# Patient Record
Sex: Male | Born: 1980 | Race: White | Hispanic: No | Marital: Married | Smoking: Never smoker
Health system: Southern US, Community
[De-identification: ages and names within clinical notes are randomized; demographics above are authoritative.]

## PROBLEM LIST (undated history)

## (undated) DIAGNOSIS — F32A Depression, unspecified: Secondary | ICD-10-CM

## (undated) DIAGNOSIS — J301 Allergic rhinitis due to pollen: Secondary | ICD-10-CM

## (undated) DIAGNOSIS — J019 Acute sinusitis, unspecified: Secondary | ICD-10-CM

## (undated) DIAGNOSIS — F909 Attention-deficit hyperactivity disorder, unspecified type: Secondary | ICD-10-CM

## (undated) DIAGNOSIS — J029 Acute pharyngitis, unspecified: Secondary | ICD-10-CM

## (undated) DIAGNOSIS — G43909 Migraine, unspecified, not intractable, without status migrainosus: Secondary | ICD-10-CM

## (undated) HISTORY — DX: Acute sinusitis, unspecified: J01.90

## (undated) HISTORY — DX: Acute pharyngitis, unspecified: J02.9

## (undated) HISTORY — DX: Allergic rhinitis due to pollen: J30.1

## (undated) HISTORY — DX: Migraine, unspecified, not intractable, without status migrainosus: G43.909

## (undated) HISTORY — PX: HAIR TRANSPLANT: SHX1719

## (undated) HISTORY — PX: TONSILLECTOMY: SUR1361

## (undated) HISTORY — PX: RHINOPLASTY: SUR1284

---

## 2009-06-16 ENCOUNTER — Ambulatory Visit (HOSPITAL_COMMUNITY): Admission: RE | Admit: 2009-06-16 | Discharge: 2009-06-16 | Payer: Self-pay | Admitting: Family Medicine

## 2012-06-25 ENCOUNTER — Emergency Department: Payer: Self-pay | Admitting: Emergency Medicine

## 2012-06-25 LAB — LIPASE, BLOOD: Lipase: 99 U/L (ref 73–393)

## 2012-06-25 LAB — COMPREHENSIVE METABOLIC PANEL
Albumin: 4.2 g/dL (ref 3.4–5.0)
Alkaline Phosphatase: 53 U/L (ref 50–136)
Bilirubin,Total: 0.4 mg/dL (ref 0.2–1.0)
Co2: 24 mmol/L (ref 21–32)
Creatinine: 1.1 mg/dL (ref 0.60–1.30)
Glucose: 138 mg/dL — ABNORMAL HIGH (ref 65–99)
Total Protein: 8.1 g/dL (ref 6.4–8.2)

## 2012-06-25 LAB — CBC
MCV: 88 fL (ref 80–100)
RBC: 5.19 10*6/uL (ref 4.40–5.90)

## 2012-09-12 ENCOUNTER — Ambulatory Visit: Payer: Self-pay | Admitting: Family Medicine

## 2013-07-26 DIAGNOSIS — J302 Other seasonal allergic rhinitis: Secondary | ICD-10-CM | POA: Insufficient documentation

## 2014-02-06 ENCOUNTER — Ambulatory Visit: Payer: Self-pay | Admitting: Family Medicine

## 2014-05-12 LAB — LIPID PANEL
Cholesterol: 204 mg/dL — AB (ref 0–200)
HDL: 44 mg/dL (ref 35–70)
LDL CALC: 134 mg/dL
Triglycerides: 129 mg/dL (ref 40–160)

## 2014-11-10 ENCOUNTER — Ambulatory Visit: Payer: Self-pay | Admitting: Family Medicine

## 2015-06-08 ENCOUNTER — Other Ambulatory Visit
Admission: RE | Admit: 2015-06-08 | Discharge: 2015-06-08 | Disposition: A | Discharge status: Home / Self Care | Payer: BC Managed Care – PPO | Source: Ambulatory Visit | Attending: Family Medicine | Admitting: Family Medicine

## 2015-06-08 ENCOUNTER — Encounter: Payer: Self-pay | Admitting: Family Medicine

## 2015-06-08 ENCOUNTER — Ambulatory Visit (INDEPENDENT_AMBULATORY_CARE_PROVIDER_SITE_OTHER): Payer: BC Managed Care – PPO | Admitting: Family Medicine

## 2015-06-08 VITALS — BP 108/66 | HR 73 | Temp 98.3°F | Resp 16 | Ht 71.0 in | Wt 236.3 lb

## 2015-06-08 DIAGNOSIS — R197 Diarrhea, unspecified: Secondary | ICD-10-CM | POA: Diagnosis not present

## 2015-06-08 DIAGNOSIS — Z23 Encounter for immunization: Secondary | ICD-10-CM | POA: Diagnosis not present

## 2015-06-08 DIAGNOSIS — E669 Obesity, unspecified: Secondary | ICD-10-CM | POA: Insufficient documentation

## 2015-06-08 DIAGNOSIS — R110 Nausea: Secondary | ICD-10-CM | POA: Diagnosis not present

## 2015-06-08 DIAGNOSIS — G43909 Migraine, unspecified, not intractable, without status migrainosus: Secondary | ICD-10-CM | POA: Insufficient documentation

## 2015-06-08 DIAGNOSIS — R1030 Lower abdominal pain, unspecified: Secondary | ICD-10-CM | POA: Diagnosis not present

## 2015-06-08 LAB — COMPREHENSIVE METABOLIC PANEL
ALBUMIN: 4.3 g/dL (ref 3.5–5.0)
ALK PHOS: 49 U/L (ref 38–126)
ALT: 64 U/L — AB (ref 17–63)
ANION GAP: 5 (ref 5–15)
AST: 32 U/L (ref 15–41)
BUN: 8 mg/dL (ref 6–20)
CALCIUM: 8.8 mg/dL — AB (ref 8.9–10.3)
CO2: 28 mmol/L (ref 22–32)
CREATININE: 0.89 mg/dL (ref 0.61–1.24)
Chloride: 105 mmol/L (ref 101–111)
GFR calc Af Amer: >60 mL/min (ref >60)
GFR calc non Af Amer: >60 mL/min (ref >60)
GLUCOSE: 93 mg/dL (ref 65–99)
Potassium: 3.7 mmol/L (ref 3.5–5.1)
SODIUM: 138 mmol/L (ref 135–145)
Total Bilirubin: 0.4 mg/dL (ref 0.3–1.2)
Total Protein: 8 g/dL (ref 6.5–8.1)

## 2015-06-08 LAB — CBC WITH DIFFERENTIAL/PLATELET
BASOS ABS: 0 10*3/uL (ref 0–0.1)
BASOS PCT: 1 %
EOS ABS: 0.2 10*3/uL (ref 0–0.7)
Eosinophils Relative: 2 %
HCT: 42.5 % (ref 40.0–52.0)
HEMOGLOBIN: 14.7 g/dL (ref 13.0–18.0)
Lymphocytes Relative: 28 %
Lymphs Abs: 2.1 10*3/uL (ref 1.0–3.6)
MCH: 29.9 pg (ref 26.0–34.0)
MCHC: 34.5 g/dL (ref 32.0–36.0)
MCV: 86.5 fL (ref 80.0–100.0)
Monocytes Absolute: 0.9 10*3/uL (ref 0.2–1.0)
Monocytes Relative: 12 %
NEUTROS PCT: 57 %
Neutro Abs: 4.4 10*3/uL (ref 1.4–6.5)
Platelets: 328 10*3/uL (ref 150–440)
RBC: 4.91 MIL/uL (ref 4.40–5.90)
RDW: 12.4 % (ref 11.5–14.5)
WBC: 7.6 10*3/uL (ref 3.8–10.6)

## 2015-06-08 LAB — POC HEMOCCULT BLD/STL (OFFICE/1-CARD/DIAGNOSTIC): Fecal Occult Blood, POC: NEGATIVE

## 2015-06-08 NOTE — Progress Notes (Signed)
Name: Gregory Matthews   MRN: [id number]    DOB: 1980/10/17   Date:06/08/2015       Progress Note  Subjective  Chief Complaint  Chief Complaint  Patient presents with  . Abdominal Pain    Onset- Friday at work, nausea, liquid diarrhea, lower abdominal pain and radiates to right lumbar region. Dry heaving and pain has been sharp and constant, worst with movement.  . Diarrhea    Having dark liquidly stools    HPI  Abdominal pain : he states he left work on Friday with low abdominal pain, described as dull and sometimes sharp. Nausea, fatigue, lack of appetite, followed by diarrhea - many episodes from Saturday until this am. Last episode about 7 hours ago. Stools were dark, watery, but negative for bright blood. He denies fever. Able to keep fluids down, eating very little because he is not hungry.  He is not sure if he lost weight. He is feeling very bloated. Wife has pneumonia.  Patient Active Problem List   Diagnosis Date Noted  . Headache, migraine 06/08/2015  . Obesity (BMI 30.0-34.9) 06/08/2015  . Allergic rhinitis, seasonal 07/26/2013    Past Surgical History  Procedure Laterality Date  . Rhinoplasty    . Tonsillectomy      Family History  Problem Relation Age of Onset  . Cancer Mother     Breast  . Hypertension Father   . Cancer Maternal Aunt     Breast  . Hyperlipidemia Maternal Grandmother   . Parkinson's disease Paternal Grandfather     Social History   Social History  . Marital Status: Married    Spouse Name: N/A  . Number of Children: N/A  . Years of Education: N/A   Occupational History  . Not on file.   Social History Main Topics  . Smoking status: Never Smoker   . Smokeless tobacco: Never Used  . Alcohol Use: No  . Drug Use: No  . Sexual Activity:    Partners: Female   Other Topics Concern  . Not on file   Social History Narrative    No current outpatient prescriptions on file.  Allergies  Allergen Reactions  . Penicillins Rash      ROS  Ten systems reviewed and is negative except as mentioned in HPI   Objective  Filed Vitals:   06/08/15 1449  BP: 108/66  Pulse: 73  Temp: 98.3 F (36.8 C)  TempSrc: Oral  Resp: 16  Height:  (1.803 m)  Weight: 236 lb 4.8 oz (107.185 kg)  SpO2: 97%    Body mass index is 32.97 kg/(m^2).  Physical Exam  Constitutional: Patient appears well-developed and well-nourished. Obese  No distress, but has sunken eyes with dark circles underneath HEENT: head atraumatic, normocephalic, pupils equal and reactive to light, , neck supple, throat within normal limits Cardiovascular: Normal rate, regular rhythm and normal heart sounds.  No murmur heard. No BLE edema. Pulmonary/Chest: Effort normal and breath sounds normal. No respiratory distress. Abdominal: Soft. Normal bowel sounds, tender during palpation throughout but worse during palpation of RLQ, negative hemoccult stools. No rectal masses. Stools brown Psychiatric: Patient has a normal mood and affect. behavior is normal. Judgment and thought content normal.    PHQ2/9: Depression screen PHQ 2/9 06/08/2015  Decreased Interest 0  Down, Depressed, Hopeless 0  PHQ - 2 Score 0     Fall Risk: Fall Risk  06/08/2015  Falls in the past year? No     Functional Status  Survey: Is the patient deaf or have difficulty hearing?: No Does the patient have difficulty seeing, even when wearing glasses/contacts?: No Does the patient have difficulty concentrating, remembering, or making decisions?: No Does the patient have difficulty walking or climbing stairs?: No Does the patient have difficulty dressing or bathing?: No Does the patient have difficulty doing errands alone such as visiting a doctor's office or shopping?: No    Assessment & Plan   1. Lower abdominal pain  Worse tenderness on RLQ, negative hemoccult stools, he is afebrile but still very bloated with lack of appetite, we will check stat labs, if WBC if high  we will order a CT scan abdomen stat to rule out appendicitis - CBC with Differential/Platelet - Comprehensive metabolic panel - Flu Vaccine QUAD 36+ mos IM - POC Hemoccult Bld/Stl (1-Cd Office Dx)  2. Nausea  - CBC with Differential/Platelet - Comprehensive metabolic panel - Flu Vaccine QUAD 36+ mos IM  3. Diarrhea, unspecified type  - CBC with Differential/Platelet - Comprehensive metabolic panel - Flu Vaccine QUAD 36+ mos IM - POC Hemoccult Bld/Stl (1-Cd Office Dx)  4. Needs flu shot  - Flu Vaccine QUAD 36+ mos IM

## 2015-06-10 ENCOUNTER — Telehealth: Payer: Self-pay | Admitting: Family Medicine

## 2015-06-10 NOTE — Telephone Encounter (Signed)
Please contact patient . Ask if he is feeling better.

## 2015-06-10 NOTE — Telephone Encounter (Signed)
I tried to contact this patient to see how he was doing but there was no answer and the mailbox was full so I was not able to leave a message.

## 2015-08-31 ENCOUNTER — Ambulatory Visit (INDEPENDENT_AMBULATORY_CARE_PROVIDER_SITE_OTHER): Payer: BC Managed Care – PPO | Admitting: Family Medicine

## 2015-08-31 ENCOUNTER — Encounter: Payer: Self-pay | Admitting: Family Medicine

## 2015-08-31 VITALS — BP 104/66 | HR 61 | Temp 98.0°F | Resp 12 | Ht 70.5 in | Wt 241.4 lb

## 2015-08-31 DIAGNOSIS — G43011 Migraine without aura, intractable, with status migrainosus: Secondary | ICD-10-CM | POA: Diagnosis not present

## 2015-08-31 MED ORDER — TOPIRAMATE 50 MG PO TABS: 25.0000 mg | ORAL_TABLET | Freq: Two times a day (BID) | ORAL | Status: DC

## 2015-08-31 MED ORDER — KETOROLAC TROMETHAMINE 60 MG/2ML IM SOLN
60.0000 mg | Freq: Once | INTRAMUSCULAR | Status: AC
  Administered 2015-08-31: 60 mg via INTRAMUSCULAR

## 2015-08-31 MED ORDER — DEXAMETHASONE SODIUM PHOSPHATE 10 MG/ML IJ SOLN
10.0000 mg | Freq: Once | INTRAMUSCULAR | Status: AC
  Administered 2015-08-31: 10 mg via INTRAMUSCULAR

## 2015-08-31 MED ORDER — PROMETHAZINE HCL 12.5 MG PO TABS: 12.5000 mg | ORAL_TABLET | Freq: Three times a day (TID) | ORAL | Status: DC | PRN

## 2015-08-31 MED ORDER — SUMATRIPTAN SUCCINATE 100 MG PO TABS: 100.0000 mg | ORAL_TABLET | ORAL | Status: DC | PRN

## 2015-08-31 MED ORDER — PROMETHAZINE HCL 25 MG/ML IJ SOLN: 25.0000 mg | Freq: Once | INTRAMUSCULAR | Status: DC

## 2015-08-31 NOTE — Progress Notes (Signed)
Name: Gregory Matthews   MRN: [id number]    DOB: [birth year]   Date:08/31/2015       Progress Note  Subjective  Chief Complaint  Chief Complaint  Patient presents with  . Migraine    started yesterday afternoon and has intensified. nausea, pain and sensitivity to light. otc excedrin migraine.    HPI  Migraine: he states he has a long history of migraine headaches, but used to respond to Excedrin migraines. The episodes have been getting more frequent and intense. This episode started yesterday, initially with a dull right temporal headache, followed by nausea and throbbing, pounding sensation on right temporal area. He has photophobia, phonophobia and movement - such as walking. He denies any neuro deficit.    Patient Active Problem List   Diagnosis Date Noted  . Headache, migraine 06/08/2015  . Obesity (BMI 30.0-34.9) 06/08/2015  . Allergic rhinitis, seasonal 07/26/2013    Past Surgical History  Procedure Laterality Date  . Rhinoplasty    . Tonsillectomy      Family History  Problem Relation Age of Onset  . Cancer Mother     Breast  . Hypertension Father   . Cancer Maternal Aunt     Breast  . Hyperlipidemia Maternal Grandmother   . Parkinson's disease Paternal Grandfather     Social History   Social History  . Marital Status: Married    Spouse Name: N/A  . Number of Children: N/A  . Years of Education: N/A   Occupational History  . Not on file.   Social History Main Topics  . Smoking status: Never Smoker   . Smokeless tobacco: Never Used  . Alcohol Use: No  . Drug Use: No  . Sexual Activity:    Partners: Female   Other Topics Concern  . Not on file   Social History Narrative     Current outpatient prescriptions:  .  promethazine (PHENERGAN) 12.5 MG tablet, Take 1 tablet (12.5 mg total) by mouth every 8 (eight) hours as needed for nausea or vomiting., Disp: 20 tablet, Rfl: 0 .  SUMAtriptan (IMITREX) 100 MG tablet, Take 1 tablet (100 mg total) by  mouth every 2 (two) hours as needed for migraine. May repeat in 2 hours if headache persists or recurs., Disp: 10 tablet, Rfl: 0 .  topiramate (TOPAMAX) 50 MG tablet, Take 0.5-2 tablets (25-100 mg total) by mouth 2 (two) times daily., Disp: 60 tablet, Rfl: 0  Current facility-administered medications:  .  dexamethasone (DECADRON) injection 10 mg, 10 mg, Intramuscular, Once, Steele Sizer, MD .  ketorolac (TORADOL) injection 60 mg, 60 mg, Intramuscular, Once, Steele Sizer, MD .  promethazine (PHENERGAN) injection 25 mg, 25 mg, Intramuscular, Once, Steele Sizer, MD  Allergies  Allergen Reactions  . Penicillins Rash     ROS  Ten systems reviewed and is negative except as mentioned in HPI   Objective  Filed Vitals:   08/31/15 1139  BP: 104/66  Pulse: 61  Temp: 98 F (36.7 C)  TempSrc: Oral  Resp: 12  Height: 5' 10.5" (1.791 m)  Weight: 241 lb 6.4 oz (109.498 kg)  SpO2: 98%    Body mass index is 34.14 kg/(m^2).  Physical Exam  Constitutional: Patient appears well-developed and well-nourished. Obese  No distress.  HEENT: head atraumatic, normocephalic, pupils equal and reactive to light,neck supple, throat within normal limits Cardiovascular: Normal rate, regular rhythm and normal heart sounds.  No murmur heard. No BLE edema. Pulmonary/Chest: Effort normal and breath sounds normal. No respiratory  distress. Abdominal: Soft.  There is no tenderness. Psychiatric: Patient has a normal mood and affect. behavior is normal. Judgment and thought content normal. Neurological: cranial nerves negative, normal strength and sensation  Recent Results (from the past 2160 hour(s))  POC Hemoccult Bld/Stl (1-Cd Office Dx)     Status: Normal   Collection Time: 06/08/15  3:11 PM  Result Value Ref Range   Card #1 Date 06/08/2015    Fecal Occult Blood, POC Negative Negative  CBC with Differential/Platelet     Status: None   Collection Time: 06/08/15  3:45 PM  Result Value Ref Range    WBC 7.6 3.8 - 10.6 K/uL   RBC 4.91 4.40 - 5.90 MIL/uL   Hemoglobin 14.7 13.0 - 18.0 g/dL   HCT 42.5 40.0 - 52.0 %   MCV 86.5 80.0 - 100.0 fL   MCH 29.9 26.0 - 34.0 pg   MCHC 34.5 32.0 - 36.0 g/dL   RDW 12.4 11.5 - 14.5 %   Platelets 328 150 - 440 K/uL   Neutrophils Relative % 57 %   Neutro Abs 4.4 1.4 - 6.5 K/uL   Lymphocytes Relative 28 %   Lymphs Abs 2.1 1.0 - 3.6 K/uL   Monocytes Relative 12 %   Monocytes Absolute 0.9 0.2 - 1.0 K/uL   Eosinophils Relative 2 %   Eosinophils Absolute 0.2 0 - 0.7 K/uL   Basophils Relative 1 %   Basophils Absolute 0.0 0 - 0.1 K/uL  Comprehensive metabolic panel     Status: Abnormal   Collection Time: 06/08/15  3:45 PM  Result Value Ref Range   Sodium 138 135 - 145 mmol/L   Potassium 3.7 3.5 - 5.1 mmol/L   Chloride 105 101 - 111 mmol/L   CO2 28 22 - 32 mmol/L   Glucose, Bld 93 65 - 99 mg/dL   BUN 8 6 - 20 mg/dL   Creatinine, Ser 0.89 0.61 - 1.24 mg/dL   Calcium 8.8 (L) 8.9 - 10.3 mg/dL   Total Protein 8.0 6.5 - 8.1 g/dL   Albumin 4.3 3.5 - 5.0 g/dL   AST 32 15 - 41 U/L   ALT 64 (H) 17 - 63 U/L   Alkaline Phosphatase 49 38 - 126 U/L   Total Bilirubin 0.4 0.3 - 1.2 mg/dL   GFR calc non Af Amer >60 >60 mL/min   GFR calc Af Amer >60 >60 mL/min    Comment: (NOTE) The eGFR has been calculated using the CKD EPI equation. This calculation has not been validated in all clinical situations. eGFR's persistently <60 mL/min signify possible Chronic Kidney Disease.    Anion gap 5 5 - 15     PHQ2/9: Depression screen [facility] 2/9 08/31/2015 06/08/2015  Decreased Interest 0 0  Down, Depressed, Hopeless 0 0  PHQ - 2 Score 0 0    Fall Risk: Fall Risk  08/31/2015 06/08/2015  Falls in the past year? No No    Functional Status Survey: Is the patient deaf or have difficulty hearing?: No Does the patient have difficulty seeing, even when wearing glasses/contacts?: No Does the patient have difficulty concentrating, remembering, or making decisions?:  No Does the patient have difficulty walking or climbing stairs?: No Does the patient have difficulty dressing or bathing?: No Does the patient have difficulty doing errands alone such as visiting a doctor's office or shopping?: No    Assessment & Plan  1. Intractable migraine without aura and with status migrainosus  Advised to avoid taking nsaid's  on regular basis because of risk of rebound headaches, possible side effects of Topamax and the need to titrate up and down slowly - promethazine (PHENERGAN) 12.5 MG tablet; Take 1 tablet (12.5 mg total) by mouth every 8 (eight) hours as needed for nausea or vomiting.  Dispense: 20 tablet; Refill: 0 - topiramate (TOPAMAX) 50 MG tablet; Take 0.5-2 tablets (25-100 mg total) by mouth 2 (two) times daily.  Dispense: 60 tablet; Refill: 0 - SUMAtriptan (IMITREX) 100 MG tablet; Take 1 tablet (100 mg total) by mouth every 2 (two) hours as needed for migraine. May repeat in 2 hours if headache persists or recurs.  Dispense: 10 tablet; Refill: 0 - ketorolac (TORADOL) injection 60 mg; Inject 2 mLs (60 mg total) into the muscle once. - dexamethasone (DECADRON) injection 10 mg; Inject 1 mL (10 mg total) into the muscle once.  We were out of stock of promethazine

## 2015-10-09 ENCOUNTER — Encounter: Payer: Self-pay | Admitting: Family Medicine

## 2015-10-09 ENCOUNTER — Ambulatory Visit (INDEPENDENT_AMBULATORY_CARE_PROVIDER_SITE_OTHER): Payer: BC Managed Care – PPO | Admitting: Family Medicine

## 2015-10-09 VITALS — BP 110/64 | HR 79 | Temp 98.0°F | Resp 16 | Ht 71.0 in | Wt 242.7 lb

## 2015-10-09 DIAGNOSIS — G43009 Migraine without aura, not intractable, without status migrainosus: Secondary | ICD-10-CM | POA: Diagnosis not present

## 2015-10-09 MED ORDER — PREDNISONE 10 MG PO TABS: 10.0000 mg | ORAL_TABLET | Freq: Two times a day (BID) | ORAL | Status: DC | PRN

## 2015-10-09 MED ORDER — AMITRIPTYLINE HCL 25 MG PO TABS: 25.0000 mg | ORAL_TABLET | Freq: Every day | ORAL | Status: DC

## 2015-10-09 NOTE — Progress Notes (Signed)
Name: Gregory Matthews   MRN: [id number]    DOB: [birth year]   Date:10/09/2015       Progress Note  Subjective  Chief Complaint  Chief Complaint  Patient presents with  . Follow-up    patient is here for his 68-month f/u.  . Migraine    Topamax was giving him terrible side effects so he stopped the medication after a week and a half, feels like his cheeks were hot, pressure in his chest.  Patient states the Imitrex also made him heated and would intensive his migraines instead of helping them. Patient states he did take the Promethazine for nausea and it did help, but only has had to take it 1 or twice and had no side effects with this medication. Patient states when moving alot of furniture at work, they come on. Has taken OTC to help    HPI  Migraine: he states he has a long history of migraine headaches, and takes  Excedrin migraine prn. He had a severe episode one month ago that required him to be seen in our office. We treated him in our office and started Topamax and Imitrex. He has stopped both medication secondary to side effects. Topamax he stopped because of change in taste and Imitrex, because flushing sensation and chest tightness. Episodes of migraine are described as a dull  headache, followed by nausea and throbbing, pounding sensation in different parts of his head.  It is associated with photophobia, phonophobia and movement - such as walking. He states had a few episodes of migraine since last visit, not as intense, but also some of dull /tension headache. 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 Headache, migraine 06/08/2015  . Obesity (BMI 30.0-34.9) 06/08/2015  . Allergic rhinitis, seasonal 07/26/2013    Past Surgical History  Procedure Laterality Date  . Rhinoplasty    . Tonsillectomy      Family History  Problem Relation Age of Onset  . Cancer Mother     Breast  .  Hypertension Father   . Cancer Maternal Aunt     Breast  . Hyperlipidemia Maternal Grandmother   . Parkinson's disease Paternal Grandfather     Social History   Social History  . Marital Status: Married    Spouse Name: N/A  . Number of Children: N/A  . Years of Education: N/A   Occupational History  . Not on file.   Social History Main Topics  . Smoking status: Never Smoker   . Smokeless tobacco: Never Used  . Alcohol Use: No  . Drug Use: No  . Sexual Activity:    Partners: Female   Other Topics Concern  . Not on file   Social History Narrative     Current outpatient prescriptions:  .  promethazine (PHENERGAN) 12.5 MG tablet, Take 1 tablet (12.5 mg total) by mouth every 8 (eight) hours as needed for nausea or vomiting., Disp: 20 tablet, Rfl: 0 .  amitriptyline (ELAVIL) 25 MG tablet, Take 1 tablet (25 mg total) by mouth at bedtime., Disp: 30 tablet, Rfl: 2 .  predniSONE (DELTASONE) 10 MG tablet, Take 1 tablet (10 mg total) by mouth 2 (two) times daily as needed., Disp: 10 tablet, Rfl: 0  Allergies  Allergen Reactions  . Penicillins Rash     ROS  Constitutional: Negative for fever or weight change.  Respiratory: Negative for  cough and shortness of breath.   Cardiovascular: Negative for chest pain or palpitations.  Gastrointestinal: Negative for abdominal pain, no bowel changes.  Musculoskeletal: Negative for gait problem or joint swelling.  Skin: Negative for rash.  Neurological: Negative for dizziness , positive for  headache.  No other specific complaints in a complete review of systems (except as listed in HPI above).  Objective  Filed Vitals:   10/09/15 1052  BP: 110/64  Pulse: 79  Temp: 98 F (36.7 C)  TempSrc: Oral  Resp: 16  Height: 5\' 11"  (1.803 m)  Weight: 242 lb 11.2 oz (110.088 kg)  SpO2: 97%    Body mass index is 33.86 kg/(m^2).  Physical Exam  Constitutional: Patient appears well-developed and well-nourished. Obese  No distress.   HEENT: head atraumatic, normocephalic, pupils equal and reactive to light,neck supple, throat within normal limits Cardiovascular: Normal rate, regular rhythm and normal heart sounds.  No murmur heard. No BLE edema. Pulmonary/Chest: Effort normal and breath sounds normal. No respiratory distress. Abdominal: Soft.  There is no tenderness. Psychiatric: Patient has a normal mood and affect. behavior is normal. Judgment and thought content normal. Neurology: normal exam  PHQ2/9: Depression screen [facility] 2/9 10/09/2015 08/31/2015 06/08/2015  Decreased Interest 0 0 0  Down, Depressed, Hopeless 0 0 0  PHQ - 2 Score 0 0 0    Fall Risk: Fall Risk  10/09/2015 08/31/2015 06/08/2015  Falls in the past year? No No No    Functional Status Survey: Is the patient deaf or have difficulty hearing?: No Does the patient have difficulty seeing, even when wearing glasses/contacts?: No Does the patient have difficulty concentrating, remembering, or making decisions?: No Does the patient have difficulty walking or climbing stairs?: No Does the patient have difficulty dressing or bathing?: No Does the patient have difficulty doing errands alone such as visiting a doctor's office or shopping?: No    Assessment & Plan  1. Migraine without aura and without status migrainosus, not intractable  May continue prn Excedrin, avoid triggers - amitriptyline (ELAVIL) 25 MG tablet; Take 1 tablet (25 mg total) by mouth at bedtime.  Dispense: 30 tablet; Refill: 2 - predniSONE (DELTASONE) 10 MG tablet; Take 1 tablet (10 mg total) by mouth 2 (two) times daily as needed.  Dispense: 10 tablet; Refill: 0

## 2015-10-09 NOTE — Patient Instructions (Signed)

## 2015-12-08 ENCOUNTER — Ambulatory Visit (INDEPENDENT_AMBULATORY_CARE_PROVIDER_SITE_OTHER): Payer: BC Managed Care – PPO | Admitting: Family Medicine

## 2015-12-08 ENCOUNTER — Encounter: Payer: Self-pay | Admitting: Family Medicine

## 2015-12-08 VITALS — BP 114/62 | HR 81 | Temp 97.6°F | Resp 18 | Ht 70.0 in | Wt 241.5 lb

## 2015-12-08 DIAGNOSIS — Z131 Encounter for screening for diabetes mellitus: Secondary | ICD-10-CM

## 2015-12-08 DIAGNOSIS — Z111 Encounter for screening for respiratory tuberculosis: Secondary | ICD-10-CM | POA: Diagnosis not present

## 2015-12-08 DIAGNOSIS — G43009 Migraine without aura, not intractable, without status migrainosus: Secondary | ICD-10-CM

## 2015-12-08 DIAGNOSIS — Z13220 Encounter for screening for lipoid disorders: Secondary | ICD-10-CM | POA: Diagnosis not present

## 2015-12-08 DIAGNOSIS — Z0000 Encounter for general adult medical examination without abnormal findings: Secondary | ICD-10-CM

## 2015-12-08 DIAGNOSIS — Z79899 Other long term (current) drug therapy: Secondary | ICD-10-CM | POA: Diagnosis not present

## 2015-12-08 LAB — COMPLETE METABOLIC PANEL WITH GFR
ALBUMIN: 4.6 g/dL (ref 3.6–5.1)
ALT: 64 U/L — AB (ref 9–46)
AST: 30 U/L (ref 10–40)
Alkaline Phosphatase: 49 U/L (ref 40–115)
BILIRUBIN TOTAL: 0.4 mg/dL (ref 0.2–1.2)
BUN: 15 mg/dL (ref 7–25)
CALCIUM: 9.1 mg/dL (ref 8.6–10.3)
CO2: 21 mmol/L (ref 20–31)
CREATININE: 0.84 mg/dL (ref 0.60–1.35)
Chloride: 105 mmol/L (ref 98–110)
GFR, Est Non African American: >89 mL/min (ref >=60)
Glucose, Bld: 93 mg/dL (ref 65–99)
Potassium: 3.8 mmol/L (ref 3.5–5.3)
Sodium: 137 mmol/L (ref 135–146)
TOTAL PROTEIN: 7.6 g/dL (ref 6.1–8.1)

## 2015-12-08 LAB — LIPID PANEL
CHOL/HDL RATIO: 4 ratio (ref <=5.0)
CHOLESTEROL: 197 mg/dL (ref 125–200)
HDL: 49 mg/dL (ref >=40)
LDL Cholesterol: 127 mg/dL (ref <130)
TRIGLYCERIDES: 106 mg/dL (ref <150)
VLDL: 21 mg/dL (ref <30)

## 2015-12-08 MED ORDER — AMITRIPTYLINE HCL 25 MG PO TABS: 25.0000 mg | ORAL_TABLET | Freq: Every day | ORAL | 2 refills | Status: DC

## 2015-12-08 NOTE — Patient Instructions (Signed)
Discussed couch to 5 K program - on the phone 7 minute work - out Walk for at least 30 minutes daily

## 2015-12-08 NOTE — Progress Notes (Signed)
Name: Gregory Matthews   MRN: [id number]    DOB: 11/25/1980   Date:12/08/2015       Progress Note  Subjective  Chief Complaint  Chief Complaint  Patient presents with  . Annual Exam    HPI  Male Exam: he is trying to wean off caffeine, down to 12 ounces of sodas daily, he use to drink 48 ounces daily. He is working out at home. Insanity program at home, with his wife. He states his father had a MI a few weeks ago and he wants to get healthier. He is feeling well, started to drink more water.   IPSS Questionnaire (AUA-7): Over the past month.   1)  How often have you had a sensation of not emptying your bladder completely after you finish urinating?  0 - Not at all  2)  How often have you had to urinate again less than two hours after you finished urinating? 1 - Less than 1 time in 5  3)  How often have you found you stopped and started again several times when you urinated?  0 - Not at all  4) How difficult have you found it to postpone urination?  0 - Not at all  5) How often have you had a weak urinary stream?  0 - Not at all  6) How often have you had to push or strain to begin urination?  1 - Less than 1 time in 5  7) How many times did you most typically get up to urinate from the time you went to bed until the time you got up in the morning?  1 - 1 time  Total score:  0-7 mildly symptomatic   8-19 moderately symptomatic   20-35 severely symptomatic    Patient Active Problem List   Diagnosis Date Noted  . Headache, migraine 06/08/2015  . Obesity (BMI 30.0-34.9) 06/08/2015  . Allergic rhinitis, seasonal 07/26/2013    Past Surgical History:  Procedure Laterality Date  . RHINOPLASTY    . TONSILLECTOMY      Family History  Problem Relation Age of Onset  . Cancer Mother     Breast  . Hypertension Father   . Cancer Maternal Aunt     Breast  . Hyperlipidemia Maternal Grandmother   . Parkinson's disease Paternal Grandfather     Social History   Social History  .  Marital status: Married    Spouse name: N/A  . Number of children: N/A  . Years of education: N/A   Occupational History  . Not on file.   Social History Main Topics  . Smoking status: Never Smoker  . Smokeless tobacco: Never Used  . Alcohol use No  . Drug use: No  . Sexual activity: Yes    Partners: Female   Other Topics Concern  . Not on file   Social History Narrative  . No narrative on file     Current Outpatient Prescriptions:  .  amitriptyline (ELAVIL) 25 MG tablet, Take 1 tablet (25 mg total) by mouth at bedtime., Disp: 30 tablet, Rfl: 2 .  predniSONE (DELTASONE) 10 MG tablet, Take 1 tablet (10 mg total) by mouth 2 (two) times daily as needed., Disp: 10 tablet, Rfl: 0 .  promethazine (PHENERGAN) 12.5 MG tablet, Take 1 tablet (12.5 mg total) by mouth every 8 (eight) hours as needed for nausea or vomiting., Disp: 20 tablet, Rfl: 0  Allergies  Allergen Reactions  . Penicillins Rash     ROS  Constitutional: Negative for fever or weight change.  Respiratory: Negative for cough and shortness of breath.   Cardiovascular: Negative for chest pain or palpitations.  Gastrointestinal: Negative for abdominal pain, no bowel changes.  Musculoskeletal: Negative for gait problem or joint swelling.  Skin: Negative for rash.  Neurological: Negative for dizziness or headache.  No other specific complaints in a complete review of systems (except as listed in HPI above).  Objective  Vitals:   12/08/15 0830  BP: 114/62  Pulse: 81  Resp: 18  Temp: 97.6 F (36.4 C)  TempSrc: Oral  SpO2: 96%  Weight: 241 lb 8 oz (109.5 kg)  Height: 5\' 10"  (1.778 m)    Body mass index is 34.65 kg/m.  Physical Exam  Constitutional: Patient appears well-developed and well-nourished. Obese. No distress.  HENT: Head: Normocephalic and atraumatic. Ears: B TMs ok, no erythema or effusion; Nose: Nose normal. Mouth/Throat: Oropharynx is clear and moist. No oropharyngeal exudate.  Eyes:  Conjunctivae and EOM are normal. Pupils are equal, round, and reactive to light. No scleral icterus.  Neck: Normal range of motion. Neck supple. No JVD present. No thyromegaly present.  Cardiovascular: Normal rate, regular rhythm and normal heart sounds.  No murmur heard. No BLE edema. Pulmonary/Chest: Effort normal and breath sounds normal. No respiratory distress. Abdominal: Soft. Bowel sounds are normal, no distension. There is no tenderness. no masses MALE GENITALIA: Normal descended testes bilaterally, no masses palpated, no hernias, no lesions, no discharge RECTAL: not done Musculoskeletal: Normal range of motion, no joint effusions. No gross deformities Neurological: he is alert and oriented to person, place, and time. No cranial nerve deficit. Coordination, balance, strength, speech and gait are normal.  Skin: Skin is warm and dry. No rash noted. No erythema.  Psychiatric: Patient has a normal mood and affect. behavior is normal. Judgment and thought content normal.  PHQ2/9: [facility] 10/09/2015 08/31/2015 06/08/2015  Decreased Interest 0 0 0  Down, Depressed, Hopeless 0 0 0  PHQ - 2 Score 0 0 0     Fall Risk: Fall Risk  10/09/2015 08/31/2015 06/08/2015  Falls in the past year? No No No    Assessment & Plan  1. Encounter for routine history and physical exam for male  Discussed importance of 150 minutes of physical activity weekly, eat two servings of fish weekly, eat one serving of tree nuts ( cashews, pistachios, pecans, almonds.Marland Kitchen.) every other day, eat 6 servings of fruit/vegetables daily and drink plenty of water and avoid sweet beverages.   2. DM (diabetes mellitus screen)  - Hemoglobin A1c  3. Lipid screening  - Lipid panel  4. Encounter for long-term (current) use of high-risk medication  - COMPLETE METABOLIC PANEL WITH GFR  5. Migraine without aura and without status migrainosus, not intractable  - amitriptyline (ELAVIL) 25 MG tablet; Take 1 tablet  (25 mg total) by mouth at bedtime.  Dispense: 30 tablet; Refill: 2  6. Screening-pulmonary TB  - Quantiferon tb gold assay (blood)

## 2015-12-09 LAB — HEMOGLOBIN A1C
Hgb A1c MFr Bld: 5.1 % (ref <5.7)
Mean Plasma Glucose: 100 mg/dL

## 2015-12-10 LAB — QUANTIFERON TB GOLD ASSAY (BLOOD)
INTERFERON GAMMA RELEASE ASSAY: NEGATIVE
MITOGEN-NIL SO: 7.97 [IU]/mL
QUANTIFERON NIL VALUE: 0.02 [IU]/mL
Quantiferon Tb Ag Minus Nil Value: 0 IU/mL

## 2016-01-11 ENCOUNTER — Ambulatory Visit: Payer: BC Managed Care – PPO | Admitting: Family Medicine

## 2016-02-05 ENCOUNTER — Ambulatory Visit: Payer: BC Managed Care – PPO | Admitting: Family Medicine

## 2016-05-23 ENCOUNTER — Encounter: Payer: Self-pay | Admitting: Family Medicine

## 2016-05-23 ENCOUNTER — Ambulatory Visit (INDEPENDENT_AMBULATORY_CARE_PROVIDER_SITE_OTHER): Payer: BC Managed Care – PPO | Admitting: Family Medicine

## 2016-05-23 VITALS — BP 118/68 | HR 71 | Temp 98.2°F | Resp 16 | Ht 69.5 in | Wt 224.9 lb

## 2016-05-23 DIAGNOSIS — G43009 Migraine without aura, not intractable, without status migrainosus: Secondary | ICD-10-CM | POA: Diagnosis not present

## 2016-05-23 DIAGNOSIS — E669 Obesity, unspecified: Secondary | ICD-10-CM | POA: Diagnosis not present

## 2016-05-23 DIAGNOSIS — Z23 Encounter for immunization: Secondary | ICD-10-CM

## 2016-05-23 DIAGNOSIS — M62838 Other muscle spasm: Secondary | ICD-10-CM

## 2016-05-23 MED ORDER — PREDNISONE 10 MG PO TABS: 10.0000 mg | ORAL_TABLET | Freq: Two times a day (BID) | ORAL | 1 refills | Status: DC | PRN

## 2016-05-23 MED ORDER — PREDNISONE 10 MG PO TABS: 10.0000 mg | ORAL_TABLET | Freq: Two times a day (BID) | ORAL | 0 refills | Status: DC | PRN

## 2016-05-23 MED ORDER — AMITRIPTYLINE HCL 25 MG PO TABS: 25.0000 mg | ORAL_TABLET | Freq: Every day | ORAL | 3 refills | Status: DC

## 2016-05-23 MED ORDER — KETOROLAC TROMETHAMINE 10 MG PO TABS: 10.0000 mg | ORAL_TABLET | Freq: Four times a day (QID) | ORAL | 1 refills | Status: DC | PRN

## 2016-05-23 MED ORDER — BACLOFEN 20 MG PO TABS: 20.0000 mg | ORAL_TABLET | Freq: Every evening | ORAL | 1 refills | Status: DC | PRN

## 2016-05-23 NOTE — Progress Notes (Signed)
Name: Gregory Matthews   MRN: [id number]    DOB: [birth year]   Date:05/23/2016       Progress Note  Subjective  Chief Complaint  Chief Complaint  Patient presents with  . Medication Refill  . Follow-up    HPI  Migraine: he states he has a long history of migraine headaches, and takes  Excedrin migraine prn. His episodes are about 3-4 times a month and has been out of prednisone, still taking Elavil for prevention. We have tried Topamax and Imitrex. He has stopped both medication secondary to side effects. Topamax he stopped because of change in taste and Imitrex, because flushing sensation and chest tightness. Episodes of migraine are described as a dull  headache, followed by nausea and throbbing, pounding sensation in different parts of his head.  It is associated with photophobia, phonophobia and movement - such as walking. Each episode can last a few days. He states he ran out of Elavil, episodes were at most once a month with Elavil up to 3-4 since he ran out of medication   Muscle spasms: he works as a Runner, broadcasting/film/video and carries heavy back pain, seeing a massage therapist to help with right neck pain and spasms. He thinks it is causing some of the migraine flares.   Obesity: he has changed his diet, cooking more at home, packs his lunch, no longer drinking 6 cans of diet sodas daily and drinking at most 2 cans per day. He is drinking more water, exercise more - Insanity tapes.    Patient Active Problem List   Diagnosis Date Noted  . Headache, migraine 06/08/2015  . Obesity (BMI 30.0-34.9) 06/08/2015  . Allergic rhinitis, seasonal 07/26/2013    Past Surgical History:  Procedure Laterality Date  . RHINOPLASTY    . TONSILLECTOMY      Family History  Problem Relation Age of Onset  . Cancer Mother     Breast  . Hypertension Father   . Heart disease Father   . Heart attack Father   . Cancer Maternal Aunt     Breast  . Hyperlipidemia Maternal Grandmother   . Parkinson's disease  Paternal Grandfather     Social History   Social History  . Marital status: Married    Spouse name: N/A  . Number of children: N/A  . Years of education: N/A   Occupational History  . Not on file.   Social History Main Topics  . Smoking status: Never Smoker  . Smokeless tobacco: Never Used  . Alcohol use No  . Drug use: No  . Sexual activity: Yes    Partners: Female   Other Topics Concern  . Not on file   Social History Narrative  . No narrative on file     Current Outpatient Prescriptions:  .  amitriptyline (ELAVIL) 25 MG tablet, Take 1 tablet (25 mg total) by mouth at bedtime., Disp: 90 tablet, Rfl: 3  Allergies  Allergen Reactions  . Penicillins Rash     ROS  Ten systems reviewed and is negative except as mentioned in HPI   Objective  Vitals:   05/23/16 1422  BP: 118/68  Pulse: 71  Resp: 16  Temp: 98.2 F (36.8 C)  TempSrc: Oral  SpO2: 97%  Weight: 224 lb 14.4 oz (102 kg)  Height: 5' 9.5" (1.765 m)    Body mass index is 32.74 kg/m.  Physical Exam  Constitutional: Patient appears well-developed and well-nourished. Obese  No distress.  HEENT: head atraumatic, normocephalic, pupils  equal and reactive to light, ears normal TM, neck supple, throat within normal limits Cardiovascular: Normal rate, regular rhythm and normal heart sounds.  No murmur heard. No BLE edema. Pulmonary/Chest: Effort normal and breath sounds normal. No respiratory distress. Abdominal: Soft.  There is no tenderness. Psychiatric: Patient has a normal mood and affect. behavior is normal. Judgment and thought content normal.  PHQ2/9: Depression screen [facility] 2/[facility] 10/09/2015 08/31/2015 06/08/2015  Decreased Interest 0 0 0  Down, Depressed, Hopeless 0 0 0  PHQ - 2 Score 0 0 0     Fall Risk: Fall Risk  10/09/2015 08/31/2015 06/08/2015  Falls in the past year? No No No     Assessment & Plan  1. Migraine without aura and without status migrainosus, not intractable  -  amitriptyline (ELAVIL) 25 MG tablet; Take 1 tablet (25 mg total) by mouth at bedtime.  Dispense: 90 tablet; Refill: 3 - predniSONE (DELTASONE) 10 MG tablet; Take 1 tablet (10 mg total) by mouth 2 (two) times daily as needed.  Dispense: 10 tablet; Refill: 0 - ketorolac (TORADOL) 10 MG tablet; Take 1 tablet (10 mg total) by mouth every 6 (six) hours as needed.  Dispense: 30 tablet; Refill: 1  2. Needs flu shot  - Flu Vaccine QUAD 36+ mos IM  3. Obesity (BMI 30.0-34.9)  Doing well, continue life style modification   4. Neck muscle spasm  - baclofen (LIORESAL) 20 MG tablet; Take 1 tablet (20 mg total) by mouth at bedtime as needed and may repeat dose one time if needed for muscle spasms.  Dispense: 100 tablet; Refill: 1

## 2016-11-18 ENCOUNTER — Ambulatory Visit (INDEPENDENT_AMBULATORY_CARE_PROVIDER_SITE_OTHER): Payer: BC Managed Care – PPO | Admitting: Family Medicine

## 2016-11-18 ENCOUNTER — Encounter: Payer: Self-pay | Admitting: Family Medicine

## 2016-11-18 VITALS — BP 112/66 | HR 73 | Temp 97.8°F | Resp 18 | Ht 66.0 in | Wt 233.6 lb

## 2016-11-18 DIAGNOSIS — E669 Obesity, unspecified: Secondary | ICD-10-CM | POA: Diagnosis not present

## 2016-11-18 DIAGNOSIS — Z3141 Encounter for fertility testing: Secondary | ICD-10-CM | POA: Diagnosis not present

## 2016-11-18 DIAGNOSIS — G43009 Migraine without aura, not intractable, without status migrainosus: Secondary | ICD-10-CM | POA: Diagnosis not present
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
## 2016-11-21 ENCOUNTER — Other Ambulatory Visit: Payer: Self-pay | Admitting: Family Medicine

## 2016-11-21 DIAGNOSIS — Z3141 Encounter for fertility testing: Secondary | ICD-10-CM

## 2016-12-09 ENCOUNTER — Other Ambulatory Visit: Payer: Self-pay

## 2016-12-09 ENCOUNTER — Telehealth: Payer: Self-pay

## 2016-12-09 DIAGNOSIS — Z3141 Encounter for fertility testing: Secondary | ICD-10-CM

## 2016-12-09 NOTE — Telephone Encounter (Signed)
Patient was informed via voicemail that his order for labs has been printed and is ready for pickup.

## 2016-12-13 ENCOUNTER — Other Ambulatory Visit: Payer: Self-pay

## 2017-03-02 ENCOUNTER — Encounter: Payer: BC Managed Care – PPO | Admitting: Family Medicine

## 2017-03-23 ENCOUNTER — Encounter: Payer: Self-pay | Admitting: Family Medicine

## 2017-03-23 ENCOUNTER — Ambulatory Visit: Payer: BC Managed Care – PPO | Admitting: Family Medicine

## 2017-03-23 VITALS — BP 122/72 | HR 88 | Temp 98.4°F | Resp 16 | Ht 66.0 in | Wt 233.2 lb

## 2017-03-23 DIAGNOSIS — R0602 Shortness of breath: Secondary | ICD-10-CM | POA: Diagnosis not present

## 2017-03-23 DIAGNOSIS — R059 Cough, unspecified: Secondary | ICD-10-CM

## 2017-03-23 DIAGNOSIS — J189 Pneumonia, unspecified organism: Secondary | ICD-10-CM | POA: Diagnosis not present

## 2017-03-23 DIAGNOSIS — R05 Cough: Secondary | ICD-10-CM

## 2017-03-23 DIAGNOSIS — R490 Dysphonia: Secondary | ICD-10-CM

## 2017-03-23 MED ORDER — PREDNISONE 10 MG PO TABS: 10.0000 mg | ORAL_TABLET | Freq: Every day | ORAL | 0 refills | Status: DC

## 2017-03-23 MED ORDER — BENZONATATE 100 MG PO CAPS: 100.0000 mg | ORAL_CAPSULE | Freq: Three times a day (TID) | ORAL | 0 refills | Status: DC | PRN

## 2017-03-23 MED ORDER — DOXYCYCLINE HYCLATE 100 MG PO TABS: 100.0000 mg | ORAL_TABLET | Freq: Two times a day (BID) | ORAL | 0 refills | Status: AC

## 2017-03-23 MED ORDER — ALBUTEROL SULFATE HFA 108 (90 BASE) MCG/ACT IN AERS: 2.0000 | INHALATION_SPRAY | Freq: Four times a day (QID) | RESPIRATORY_TRACT | 0 refills | Status: DC | PRN

## 2017-03-23 NOTE — Patient Instructions (Addendum)

## 2017-03-23 NOTE — Progress Notes (Signed)
Name: Gregory Matthews   MRN: [id number]    DOB: May 27, 1980   Date:03/23/2017       Progress Note  Subjective  Chief Complaint  Chief Complaint  Patient presents with  . Sore Throat    burning and hard to swallow  . Fever    patient stated that he had a fever of 102 last night. patient stated that it comes at night. patient has taken some ibuprofen  . Chills  . Fatigue    patient presents with lots of bodyaches  . Ear Pain    patient stated that he has some bilateral ear pain and fullness  . Cough    patient stated that he cannot get a deep breath w/o coughing. very dry and hacky.,    HPI  Pt presents with 2 week complaint of respiratory infection.  Saw Urgent Care about 1.5 weeks ago for sore throat and respiratory symptoms - also had swollen lymph nodes in his neck with a fever; was told he had a viral infection (negative rapid strep and flu).  Rested for several days, drinking plenty of fluids.  Still coughing all week - deep, congested, but non-productive, mild shortness of breath; felt better a few days ago, now feeling worse.  Had fever of 102F yesterday, took advil at 1900 and his fever broke, however today he is very fatigued and is still coughing and feeling of swollen lymph nodes in his neck remains.  Denies NVD/abdominal pain, chest pain, sore throat, nasal congestion, ear pain (much improved).  Taking Tussionex without relief of cough.  Patient Active Problem List   Diagnosis Date Noted  . Headache, migraine 06/08/2015  . Obesity (BMI 30.0-34.9) 06/08/2015  . Allergic rhinitis, seasonal 07/26/2013    Social History   Tobacco Use  . Smoking status: Never Smoker  . Smokeless tobacco: Never Used  Substance Use Topics  . Alcohol use: No    Alcohol/week: 0.0 oz    Current Outpatient Medications:  .  amitriptyline (ELAVIL) 25 MG tablet, Take 1 tablet (25 mg total) by mouth at bedtime., Disp: 90 tablet, Rfl: 3 .  chlorpheniramine-HYDROcodone (TUSSIONEX) 10-8 MG/5ML  SUER, TK 5 ML PO Q 12 H, Disp: , Rfl: 0  Allergies  Allergen Reactions  . Penicillins Rash   ROS Constitutional: Negative for fever or weight change.  Respiratory: Negative for cough and shortness of breath.   Cardiovascular: Negative for chest pain or palpitations.  Gastrointestinal: Negative for abdominal pain, no bowel changes.  Musculoskeletal: Negative for gait problem or joint swelling.  Skin: Negative for rash.  Neurological: Negative for dizziness or headache.  No other specific complaints in a complete review of systems (except as listed in HPI above).  Objective  Vitals:   03/23/17 1544  BP: 122/72  Pulse: 88  Resp: 16  Temp: 98.4 F (36.9 C)  TempSrc: Oral  SpO2: 98%  Weight: 233 lb 3.2 oz (105.8 kg)  Height: 5\' 6"  (1.676 m)   Body mass index is 37.64 kg/m.  Nursing Note and Vital Signs reviewed.  Physical Exam Constitutional: Patient appears well-developed and well-nourished. Obese No distress.  HEENT: head atraumatic, normocephalic, pupils equal and reactive to light, EOM's intact, TM's without erythema or bulging, no maxillary or frontal sinus pain on palpation, neck supple with moderate submandibular lymphadenopathy - tender on palpation, oropharynx erythematous and moist without exudate Cardiovascular: Normal rate, regular rhythm, S1/S2 present.  No murmur or rub heard. No BLE edema. Pulmonary/Chest: Effort normal and breath sounds find  rhonchi to RUL and RLL, otherwise clear. No respiratory distress or retractions. Psychiatric: Patient has a normal mood and affect. behavior is normal. Judgment and thought content normal.  No results found for this or any previous visit (from the past 2160 hour(s)).   Assessment & Plan  1. Community acquired pneumonia of right lung, unspecified part of lung - doxycycline (VIBRA-TABS) 100 MG tablet; Take 1 tablet (100 mg total) by mouth 2 (two) times daily for 7 days.  Dispense: 14 tablet; Refill: 0 - albuterol  (PROVENTIL HFA;VENTOLIN HFA) 108 (90 Base) MCG/ACT inhaler; Inhale 2 puffs into the lungs every 6 (six) hours as needed for wheezing or shortness of breath.  Dispense: 1 Inhaler; Refill: 0  2. Cough - predniSONE (DELTASONE) 10 MG tablet; Take 1-5 tablets (10-50 mg total) by mouth daily with breakfast. Day1:5tabs, Day2:4tabs, Day3:3tabs, Day4:2tabs, Day5:1tab  Dispense: 20 tablet; Refill: 0 - albuterol (PROVENTIL HFA;VENTOLIN HFA) 108 (90 Base) MCG/ACT inhaler; Inhale 2 puffs into the lungs every 6 (six) hours as needed for wheezing or shortness of breath.  Dispense: 1 Inhaler; Refill: 0 - benzonatate (TESSALON PERLES) 100 MG capsule; Take 1-2 capsules (100-200 mg total) by mouth 3 (three) times daily as needed.  Dispense: 40 capsule; Refill: 0  3. Shortness of breath - predniSONE (DELTASONE) 10 MG tablet; Take 1-5 tablets (10-50 mg total) by mouth daily with breakfast. Day1:5tabs, Day2:4tabs, Day3:3tabs, Day4:2tabs, Day5:1tab  Dispense: 20 tablet; Refill: 0 - albuterol (PROVENTIL HFA;VENTOLIN HFA) 108 (90 Base) MCG/ACT inhaler; Inhale 2 puffs into the lungs every 6 (six) hours as needed for wheezing or shortness of breath.  Dispense: 1 Inhaler; Refill: 0  4. Hoarse voice quality - predniSONE (DELTASONE) 10 MG tablet; Take 1-5 tablets (10-50 mg total) by mouth daily with breakfast. Day1:5tabs, Day2:4tabs, Day3:3tabs, Day4:2tabs, Day5:1tab  Dispense: 20 tablet; Refill: 0  -Return if symptoms worsen or fail to improve, for 1-3 Days . - Work note provided for tomorrow 03/24/2017. -Red flags and when to present for emergency care or RTC including fever >101.80F, chest pain, shortness of breath, new/worsening/un-resolving symptoms, reviewed with patient at time of visit. Follow up and care instructions discussed and provided in AVS.

## 2017-03-27 ENCOUNTER — Ambulatory Visit: Payer: BC Managed Care – PPO | Admitting: Family Medicine

## 2017-03-27 ENCOUNTER — Ambulatory Visit
Admission: RE | Admit: 2017-03-27 | Discharge: 2017-03-27 | Disposition: A | Discharge status: Home / Self Care | Payer: BC Managed Care – PPO | Source: Ambulatory Visit | Attending: Family Medicine | Admitting: Family Medicine

## 2017-03-27 ENCOUNTER — Telehealth: Payer: Self-pay | Admitting: Family Medicine

## 2017-03-27 ENCOUNTER — Encounter: Payer: Self-pay | Admitting: Family Medicine

## 2017-03-27 VITALS — BP 108/68 | HR 75 | Temp 97.9°F | Resp 18 | Ht 66.0 in | Wt 234.2 lb

## 2017-03-27 DIAGNOSIS — J189 Pneumonia, unspecified organism: Secondary | ICD-10-CM

## 2017-03-27 DIAGNOSIS — J9811 Atelectasis: Secondary | ICD-10-CM | POA: Diagnosis not present

## 2017-03-27 DIAGNOSIS — R05 Cough: Secondary | ICD-10-CM | POA: Diagnosis not present

## 2017-03-27 DIAGNOSIS — R059 Cough, unspecified: Secondary | ICD-10-CM

## 2017-03-27 MED ORDER — BENZONATATE 200 MG PO CAPS: 200.0000 mg | ORAL_CAPSULE | Freq: Three times a day (TID) | ORAL | 0 refills | Status: DC | PRN

## 2017-03-27 MED ORDER — BUDESONIDE-FORMOTEROL FUMARATE 160-4.5 MCG/ACT IN AERO: 2.0000 | INHALATION_SPRAY | Freq: Two times a day (BID) | RESPIRATORY_TRACT | 0 refills | Status: DC

## 2017-03-27 NOTE — Progress Notes (Signed)
Name: Gregory Matthews   MRN: [id number]    DOB: 11/17/1980   Date:03/27/2017       Progress Note  Subjective  Chief Complaint  Chief Complaint  Patient presents with  . Follow-up  . Pneumonia    Patient was given prednisone, doxycycline and albuterol inhaler. Patient has been on his medications for 4 days and no showing any improvement, coughing and fatigue.     HPI  Cough: he developed URI 2 weeks ago, went to Urgent Care for high fever the Friday after Thankstgiving, he was told it was a viral illness and was given Tussionex and advised to rest, however URI symptoms improved, but the cough lingered, he continued to feel fatigued and was seen in our office by Maurice SmallEmily Boyce, NFP. He was given doxy, prednisone, ventolin and advised to return today if not better. He states  dry cough still present and not any better, but fatigue is slightly better today. He was able to go to work. He denies wheezing but has SOB with activity, still has pleuritic chest pain ( anterior and sometimes lower right back) No longer has fever or chills. Appetite is good.    Patient Active Problem List   Diagnosis Date Noted  . Headache, migraine 06/08/2015  . Obesity (BMI 30.0-34.9) 06/08/2015  . Allergic rhinitis, seasonal 07/26/2013    Past Surgical History:  Procedure Laterality Date  . RHINOPLASTY    . TONSILLECTOMY      Family History  Problem Relation Age of Onset  . Cancer Mother        Breast  . Hypertension Father   . Heart disease Father   . Heart attack Father   . Cancer Maternal Aunt        Breast  . Hyperlipidemia Maternal Grandmother   . Parkinson's disease Paternal Grandfather     Social History   Socioeconomic History  . Marital status: Married    Spouse name: Not on file  . Number of children: Not on file  . Years of education: Not on file  . Highest education level: Not on file  Social Needs  . Financial resource strain: Not on file  . Food insecurity - worry: Not on file   . Food insecurity - inability: Not on file  . Transportation needs - medical: Not on file  . Transportation needs - non-medical: Not on file  Occupational History  . Not on file  Tobacco Use  . Smoking status: Never Smoker  . Smokeless tobacco: Never Used  Substance and Sexual Activity  . Alcohol use: No    Alcohol/week: 0.0 oz  . Drug use: No  . Sexual activity: Yes    Partners: Female  Other Topics Concern  . Not on file  Social History Narrative  . Not on file     Current Outpatient Medications:  .  albuterol (PROVENTIL HFA;VENTOLIN HFA) 108 (90 Base) MCG/ACT inhaler, Inhale 2 puffs into the lungs every 6 (six) hours as needed for wheezing or shortness of breath., Disp: 1 Inhaler, Rfl: 0 .  amitriptyline (ELAVIL) 25 MG tablet, Take 1 tablet (25 mg total) by mouth at bedtime., Disp: 90 tablet, Rfl: 3 .  benzonatate (TESSALON PERLES) 100 MG capsule, Take 1-2 capsules (100-200 mg total) by mouth 3 (three) times daily as needed., Disp: 40 capsule, Rfl: 0 .  chlorpheniramine-HYDROcodone (TUSSIONEX) 10-8 MG/5ML SUER, TK 5 ML PO Q 12 H, Disp: , Rfl: 0 .  doxycycline (VIBRA-TABS) 100 MG tablet, Take 1 tablet (  100 mg total) by mouth 2 (two) times daily for 7 days., Disp: 14 tablet, Rfl: 0 .  predniSONE (DELTASONE) 10 MG tablet, Take 1-5 tablets (10-50 mg total) by mouth daily with breakfast. Day1:5tabs, Day2:4tabs, Day3:3tabs, Day4:2tabs, Day5:1tab, Disp: 20 tablet, Rfl: 0  Allergies  Allergen Reactions  . Penicillins Rash     ROS  Ten systems reviewed and is negative except as mentioned in HPI   Objective  Vitals:   03/27/17 1544  BP: 108/68  Pulse: 75  Resp: 18  Temp: 97.9 F (36.6 C)  TempSrc: Oral  SpO2: 99%  Weight: 234 lb 3.2 oz (106.2 kg)  Height: 5\' 6"  (1.676 m)    Body mass index is 37.8 kg/m.  Physical Exam  Constitutional: Patient appears well-developed and well-nourished. Obese  No distress.  HEENT: head atraumatic, normocephalic, pupils equal and  reactive to light, ears : normal TM bilaterally,  neck supple, throat within normal limits Cardiovascular: Normal rate, regular rhythm and normal heart sounds.  No murmur heard. No BLE edema. Pulmonary/Chest: Effort normal and breath sounds normal. No respiratory distress. Abdominal: Soft.  There is no tenderness. Psychiatric: Patient has a normal mood and affect. behavior is normal. Judgment and thought content normal.  Depression screen [facility] 2/9 03/23/2017 11/18/2016 10/09/2015 08/31/2015 06/08/2015  Decreased Interest 0 0 0 0 0  Down, Depressed, Hopeless 0 0 0 0 0  PHQ - 2 Score 0 0 0 0 0     Fall Risk: Fall Risk  03/23/2017 11/18/2016 10/09/2015 08/31/2015 06/08/2015  Falls in the past year? No No No No No     Assessment & Plan  1. Community acquired pneumonia of right lung, unspecified part of lung  - DG Chest 2 View; Future  2. Cough  Explained possible post-bronchial cough, we will add Symbicort, explained that it may last 6-8 weeks, needs to continue to rest, increase benzonate dose and finish prednisone and antibiotics for possible CAP. We will also check CXR. Return later this week for flu shot since on high dose steroid at this time - DG Chest 2 View; Future - benzonatate (TESSALON) 200 MG capsule; Take 1 capsule (200 mg total) by mouth 3 (three) times daily as needed.  Dispense: 40 capsule; Refill: 0 - budesonide-formoterol (SYMBICORT) 160-4.5 MCG/ACT inhaler; Inhale 2 puffs into the lungs 2 (two) times daily.  Dispense: 1 Inhaler; Refill: 0

## 2017-03-27 NOTE — Telephone Encounter (Signed)
Please advise he keep his appointment with Dr. Carlynn PurlSowles today as he is not feeling better after antibiotics and prednisone initiation. Thank you!

## 2017-03-27 NOTE — Telephone Encounter (Signed)
Pt advised.

## 2017-03-27 NOTE — Telephone Encounter (Signed)
Pt is still not feeling any better. Is there any way a Z Pac and or a chest xray could be ordered for him? Or will he need to come back in today? He is scheduled this afternoon with Dr Carlynn PurlSowles as of right now.

## 2017-04-24 ENCOUNTER — Ambulatory Visit
Admission: RE | Admit: 2017-04-24 | Discharge: 2017-04-24 | Disposition: A | Discharge status: Home / Self Care | Payer: BC Managed Care – PPO | Source: Ambulatory Visit | Attending: Family Medicine | Admitting: Family Medicine

## 2017-04-24 ENCOUNTER — Ambulatory Visit: Payer: BC Managed Care – PPO | Admitting: Family Medicine

## 2017-04-24 ENCOUNTER — Encounter: Payer: Self-pay | Admitting: Family Medicine

## 2017-04-24 ENCOUNTER — Other Ambulatory Visit: Payer: Self-pay | Admitting: Family Medicine

## 2017-04-24 VITALS — BP 118/72 | HR 73 | Temp 97.5°F | Resp 16 | Wt 239.9 lb

## 2017-04-24 DIAGNOSIS — R918 Other nonspecific abnormal finding of lung field: Secondary | ICD-10-CM | POA: Insufficient documentation

## 2017-04-24 DIAGNOSIS — R05 Cough: Secondary | ICD-10-CM | POA: Diagnosis present

## 2017-04-24 DIAGNOSIS — G43009 Migraine without aura, not intractable, without status migrainosus: Secondary | ICD-10-CM | POA: Diagnosis not present

## 2017-04-24 DIAGNOSIS — R059 Cough, unspecified: Secondary | ICD-10-CM

## 2017-04-24 DIAGNOSIS — M940 Chondrocostal junction syndrome [Tietze]: Secondary | ICD-10-CM | POA: Diagnosis not present

## 2017-04-24 MED ORDER — LEVOFLOXACIN 500 MG PO TABS: 500.0000 mg | ORAL_TABLET | Freq: Every day | ORAL | 0 refills | Status: DC

## 2017-04-24 MED ORDER — AMITRIPTYLINE HCL 25 MG PO TABS: 25.0000 mg | ORAL_TABLET | Freq: Every day | ORAL | 0 refills | Status: DC

## 2017-04-24 NOTE — Patient Instructions (Signed)
Please use GENERIC Aleve one or two pills every twelve hours for a few days (take with food) to help with discomfort Keep me posted of any changes Call if getting worse Try vitamin C (orange juice if not diabetic or vitamin C tablets) and drink green tea to help your immune system during your illness Get plenty of rest and hydration

## 2017-04-24 NOTE — Assessment & Plan Note (Signed)
Refill of TCA per patient's request

## 2017-04-24 NOTE — Progress Notes (Signed)
Pneumonia RUL Already did doxy PCN allergic Will treat with levaquin F/u with primary in 2-3 weeks; will need CXR then to ensure resolution

## 2017-04-24 NOTE — Progress Notes (Signed)
BP 118/72   Pulse 73   Temp (!) 97.5 F (36.4 C) (Oral)   Resp 16   Wt 239 lb 14.4 oz (108.8 kg)   SpO2 98%   BMI 38.72 kg/m    Subjective:    Patient ID: Gregory CarolinaBenjamin L Liddy, male    DOB: [birth year], [age] y.o.   MRN: [id number]  HPI: Gregory Matthews is a 36 y.o. male  Chief Complaint  Patient presents with  . URI    recently has pneumonia wants to make sure cleared up, symptoms include cough, and pat states feels like a weight in lunjgs and cannot take deep breath.    HPI He had CAP; took the medicine including the antibiotics; right mid lung on CXR Dec 4th; he did have real bad fevers 102 degrees; no travel, no visits to hospital or nursing home; started on Friday before Thanksgiving; started with sore throat; lump in throat, tested for strep and flu and those were negative; sx all the week of Thanksgiving, running real high fevers, just miserable; went to urgent care on a Sunday; they diagnosed it as a virus, just had to wait it out; gave him medicine for cough and that was it; then suffered through it, went back to work the next week, then fever and sore throat were gone, but cough was persistent and then cough got worse and fever started back; then he saw Irving Burtonmily; then saw Dr. Carlynn PurlSowles; told him to take deep breaths; was told he might have cough for six weeks; everything started to get better, and now back A few days last week, cough started to come back; hurts to take a deep breath; some heaviness in his chest; had some symptoms of chills and joints were hurting, like he was feverish Needs TCA refill for migraines  [facility] 04/24/2017 03/23/2017 11/18/2016 10/09/2015 08/31/2015  Decreased Interest 0 0 0 0 0  Down, Depressed, Hopeless 0 0 0 0 0  PHQ - 2 Score 0 0 0 0 0    Relevant past medical, surgical, family and social history reviewed Past Medical History:  Diagnosis Date  . Acute pharyngitis   . Hay fever   . Migraine   . Sinusitis, acute    Past Surgical  History:  Procedure Laterality Date  . RHINOPLASTY    . TONSILLECTOMY     Family History  Problem Relation Age of Onset  . Cancer Mother        Breast  . Hypertension Father   . Heart disease Father   . Heart attack Father   . Cancer Maternal Aunt        Breast  . Hyperlipidemia Maternal Grandmother   . Parkinson's disease Paternal Grandfather    Social History   Tobacco Use  . Smoking status: Never Smoker  . Smokeless tobacco: Never Used  Substance Use Topics  . Alcohol use: No    Alcohol/week: 0.0 oz  . Drug use: No    Interim medical history since last visit reviewed. Allergies and medications reviewed  Review of Systems Per HPI unless specifically indicated above     Objective:    BP 118/72   Pulse 73   Temp (!) 97.5 F (36.4 C) (Oral)   Resp 16   Wt 239 lb 14.4 oz (108.8 kg)   SpO2 98%   BMI 38.72 kg/m   Wt Readings from Last 3 Encounters:  04/24/17 239 lb 14.4 oz (108.8 kg)  03/27/17 234 lb 3.2 oz (  106.2 kg)  03/23/17 233 lb 3.2 oz (105.8 kg)    Physical Exam  Constitutional: He appears well-developed and well-nourished. No distress.  HENT:  Right Ear: Tympanic membrane, external ear and ear canal normal.  Left Ear: Tympanic membrane and ear canal normal.  Nose: Nose normal. No rhinorrhea.  Mouth/Throat: Oropharynx is clear and moist. Mucous membranes are not dry. No oropharyngeal exudate, posterior oropharyngeal edema or posterior oropharyngeal erythema.  Eyes: No scleral icterus.  Cardiovascular: Normal rate and regular rhythm.  Pulmonary/Chest: Effort normal and breath sounds normal. No accessory muscle usage. No respiratory distress. He has no decreased breath sounds. He has no wheezes. He has no rhonchi. He exhibits tenderness.    Lymphadenopathy:    He has no cervical adenopathy.       Right: No supraclavicular adenopathy present.       Left: No supraclavicular adenopathy present.  Neurological: He is alert.  Skin: He is not diaphoretic.  No pallor.  Normal fingernails  Psychiatric: He has a normal mood and affect.      Assessment & Plan:   Problem List Items Addressed This Visit      Cardiovascular and Mediastinum   Headache, migraine    Refill of TCA per patient's request      Relevant Medications   amitriptyline (ELAVIL) 25 MG tablet    Other Visit Diagnoses    Cough    -  Primary   diagnosed earlier with CAP; finished antibiotics; will re-image the lungs; consider ABX if indicated, or follow with CT if abnormal/progression   Relevant Orders   DG Chest 2 View   Costochondritis       NSAID for discomfort       Follow up plan: No Follow-up on file.  An after-visit summary was printed and given to the patient at check-out.  Please see the patient instructions which may contain other information and recommendations beyond what is mentioned above in the assessment and plan.  Meds ordered this encounter  Medications  . amitriptyline (ELAVIL) 25 MG tablet    Sig: Take 1 tablet (25 mg total) by mouth at bedtime.    Dispense:  90 tablet    Refill:  0    Orders Placed This Encounter  Procedures  . DG Chest 2 View

## 2017-05-04 ENCOUNTER — Encounter: Payer: Self-pay | Admitting: Family Medicine

## 2017-05-04 ENCOUNTER — Ambulatory Visit: Payer: BC Managed Care – PPO | Admitting: Family Medicine

## 2017-05-04 ENCOUNTER — Ambulatory Visit
Admission: RE | Admit: 2017-05-04 | Discharge: 2017-05-04 | Disposition: A | Discharge status: Home / Self Care | Payer: BC Managed Care – PPO | Source: Ambulatory Visit | Attending: Family Medicine | Admitting: Family Medicine

## 2017-05-04 VITALS — BP 124/82 | HR 76 | Temp 98.2°F | Resp 18 | Ht 66.0 in | Wt 238.5 lb

## 2017-05-04 DIAGNOSIS — R0602 Shortness of breath: Secondary | ICD-10-CM

## 2017-05-04 DIAGNOSIS — Z8701 Personal history of pneumonia (recurrent): Secondary | ICD-10-CM | POA: Diagnosis not present

## 2017-05-04 DIAGNOSIS — Z09 Encounter for follow-up examination after completed treatment for conditions other than malignant neoplasm: Secondary | ICD-10-CM | POA: Insufficient documentation

## 2017-05-04 DIAGNOSIS — R05 Cough: Secondary | ICD-10-CM | POA: Insufficient documentation

## 2017-05-04 DIAGNOSIS — R059 Cough, unspecified: Secondary | ICD-10-CM

## 2017-05-04 DIAGNOSIS — J181 Lobar pneumonia, unspecified organism: Secondary | ICD-10-CM

## 2017-05-04 DIAGNOSIS — J189 Pneumonia, unspecified organism: Secondary | ICD-10-CM

## 2017-05-04 MED ORDER — ALBUTEROL SULFATE HFA 108 (90 BASE) MCG/ACT IN AERS: 2.0000 | INHALATION_SPRAY | Freq: Four times a day (QID) | RESPIRATORY_TRACT | 0 refills | Status: DC | PRN

## 2017-05-04 MED ORDER — PROMETHAZINE-DM 6.25-15 MG/5ML PO SYRP: 5.0000 mL | ORAL_SOLUTION | Freq: Four times a day (QID) | ORAL | 0 refills | Status: DC | PRN

## 2017-05-04 NOTE — Progress Notes (Signed)
Name: Gregory Matthews   MRN: [id number]    DOB: 1980/05/07   Date:05/04/2017       Progress Note  Subjective  Chief Complaint  Chief Complaint  Patient presents with  . Follow-up    pneumonia, still have some congestion and harsd to take deep breaths    HPI  Patient presents for pneumonia follow up.  He completed Levaquin 5 days ago and was instructed to return for follow up & repeat chest xray - we will order today. He is still having some fatigue, chest congestion, some chest discomfort with deep inspiration, and lingering cough; still feels like he can't take a deep breath. He tried to exercise once last week had got short of breath.  Would like to try something different for cough as Tessalon did not help much.  He is out of his albuterol inhaler, we will refill today.  Denies fevers/chills, body aches, NVD or abdominal pain; headaches have been improved.  Patient Active Problem List   Diagnosis Date Noted  . Headache, migraine 06/08/2015  . Obesity (BMI 30.0-34.9) 06/08/2015  . Allergic rhinitis, seasonal 07/26/2013    Social History   Tobacco Use  . Smoking status: Never Smoker  . Smokeless tobacco: Never Used  Substance Use Topics  . Alcohol use: No    Alcohol/week: 0.0 oz     Current Outpatient Medications:  .  amitriptyline (ELAVIL) 25 MG tablet, Take 1 tablet (25 mg total) by mouth at bedtime., Disp: 90 tablet, Rfl: 0 .  albuterol (PROVENTIL HFA;VENTOLIN HFA) 108 (90 Base) MCG/ACT inhaler, Inhale 2 puffs into the lungs every 6 (six) hours as needed for wheezing or shortness of breath., Disp: 1 Inhaler, Rfl: 0 .  benzonatate (TESSALON) 200 MG capsule, Take 1 capsule (200 mg total) by mouth 3 (three) times daily as needed. (Patient not taking: Reported on 05/04/2017), Disp: 40 capsule, Rfl: 0 .  promethazine-dextromethorphan (PROMETHAZINE-DM) 6.25-15 MG/5ML syrup, Take 5 mLs by mouth 4 (four) times daily as needed for cough., Disp: 180 mL, Rfl: 0  Allergies  Allergen  Reactions  . Penicillins Rash    ROS  Ten systems reviewed and is negative except as mentioned in HPI.  Objective  Vitals:   05/04/17 1549  BP: 124/82  Pulse: 76  Resp: 18  Temp: 98.2 F (36.8 C)  TempSrc: Oral  SpO2: 99%  Weight: 238 lb 8 oz (108.2 kg)  Height: 5\' 6"  (1.676 m)   Body mass index is 38.49 kg/m.  Nursing Note and Vital Signs reviewed.  Physical Exam  Constitutional: Patient appears well-developed and well-nourished. Obese. No distress.  HEENT: head atraumatic, normocephalic, neck supple without lymphadenopathy, oropharynx pink and moist without exudate Cardiovascular: Normal rate, regular rhythm, S1/S2 present.  No murmur or rub heard. No BLE edema. Pulmonary/Chest: Effort normal and breath sounds clear. No respiratory distress or retractions. Psychiatric: Patient has a normal mood and affect. behavior is normal. Judgment and thought content normal.  No results found for this or any previous visit (from the past 2160 hour(s)).   Assessment & Plan  1. Community acquired pneumonia of right upper lobe of lung (HCC) - albuterol (PROVENTIL HFA;VENTOLIN HFA) 108 (90 Base) MCG/ACT inhaler; Inhale 2 puffs into the lungs every 6 (six) hours as needed for wheezing or shortness of breath.  Dispense: 1 Inhaler; Refill: 0 - DG Chest 2 View; Future  2. Cough - promethazine-dextromethorphan (PROMETHAZINE-DM) 6.25-15 MG/5ML syrup; Take 5 mLs by mouth 4 (four) times daily as needed for cough.  Dispense: 180 mL; Refill: 0 - albuterol (PROVENTIL HFA;VENTOLIN HFA) 108 (90 Base) MCG/ACT inhaler; Inhale 2 puffs into the lungs every 6 (six) hours as needed for wheezing or shortness of breath.  Dispense: 1 Inhaler; Refill: 0 - DG Chest 2 View; Future  3. Shortness of breath - albuterol (PROVENTIL HFA;VENTOLIN HFA) 108 (90 Base) MCG/ACT inhaler; Inhale 2 puffs into the lungs every 6 (six) hours as needed for wheezing or shortness of breath.  Dispense: 1 Inhaler; Refill: 0 -  DG Chest 2 View; Future   -Red flags and when to present for emergency care or RTC including fever >101.42F, chest pain, shortness of breath unrelieved with albuterol inhaler, new/worsening/un-resolving symptoms, reviewed with patient at time of visit. Follow up and care instructions discussed and provided in AVS.

## 2017-05-04 NOTE — Patient Instructions (Signed)
[facility]

## 2017-05-22 ENCOUNTER — Ambulatory Visit: Payer: BC Managed Care – PPO | Admitting: Family Medicine

## 2017-06-28 ENCOUNTER — Encounter: Payer: Self-pay | Admitting: Family Medicine

## 2017-06-28 ENCOUNTER — Ambulatory Visit: Payer: BC Managed Care – PPO | Admitting: Family Medicine

## 2017-06-28 VITALS — BP 110/68 | HR 73 | Temp 98.0°F | Resp 18 | Ht 66.0 in | Wt 232.9 lb

## 2017-06-28 DIAGNOSIS — R1084 Generalized abdominal pain: Secondary | ICD-10-CM

## 2017-06-28 DIAGNOSIS — R110 Nausea: Secondary | ICD-10-CM | POA: Diagnosis not present

## 2017-06-28 LAB — CBC
HCT: 43.8 % (ref 38.5–50.0)
Hemoglobin: 15.3 g/dL (ref 13.2–17.1)
MCH: 29.7 pg (ref 27.0–33.0)
MCHC: 34.9 g/dL (ref 32.0–36.0)
MCV: 85 fL (ref 80.0–100.0)
MPV: 9.2 fL (ref 7.5–12.5)
Platelets: 371 10*3/uL (ref 140–400)
RBC: 5.15 10*6/uL (ref 4.20–5.80)
RDW: 12.3 % (ref 11.0–15.0)
WBC: 8.6 10*3/uL (ref 3.8–10.8)

## 2017-06-28 LAB — BASIC METABOLIC PANEL WITH GFR
BUN: 16 mg/dL (ref 7–25)
CHLORIDE: 103 mmol/L (ref 98–110)
CO2: 26 mmol/L (ref 20–32)
Calcium: 9.7 mg/dL (ref 8.6–10.3)
Creat: 1 mg/dL (ref 0.60–1.35)
GFR, EST AFRICAN AMERICAN: 111 mL/min/{1.73_m2} (ref >=60)
GFR, Est Non African American: 96 mL/min/{1.73_m2} (ref >=60)
GLUCOSE: 143 mg/dL — AB (ref 65–139)
POTASSIUM: 3.9 mmol/L (ref 3.5–5.3)
SODIUM: 139 mmol/L (ref 135–146)

## 2017-06-28 MED ORDER — ONDANSETRON HCL 4 MG PO TABS: 4.0000 mg | ORAL_TABLET | Freq: Three times a day (TID) | ORAL | 0 refills | Status: DC | PRN

## 2017-06-28 NOTE — Progress Notes (Signed)
Name: Gregory Matthews   MRN: [id number]    DOB: 26-Jul-1980   Date:06/28/2017       Progress Note  Subjective  Chief Complaint  Chief Complaint  Patient presents with  . Abdominal Pain    for 2 days  . Nausea    HPI  - Pt presents with 2 days of abdominal pain and nausea - sudden onset that woke him from sleep two nights ago.  No vomiting or diarrhea.  Pain is now a dull ache and sensation of bloating - pain has improved over the last 24 hours - is now 3/10 from yesterday when it was 8/10. Today he woke up with a migraine - which he typically has accompanying nausea. Denies fevers/chills, body aches, chest pain, shortness of breath, dysuria, hematuria, blood in stool or dark and tarry stools or mucous in stools.  Pain and appetite have improved today.  Patient Active Problem List   Diagnosis Date Noted  . Headache, migraine 06/08/2015  . Obesity (BMI 30.0-34.9) 06/08/2015  . Allergic rhinitis, seasonal 07/26/2013    Social History   Tobacco Use  . Smoking status: Never Smoker  . Smokeless tobacco: Never Used  Substance Use Topics  . Alcohol use: No    Alcohol/week: 0.0 oz    Current Outpatient Medications:  .  amitriptyline (ELAVIL) 25 MG tablet, Take 1 tablet (25 mg total) by mouth at bedtime., Disp: 90 tablet, Rfl: 0 .  albuterol (PROVENTIL HFA;VENTOLIN HFA) 108 (90 Base) MCG/ACT inhaler, Inhale 2 puffs into the lungs every 6 (six) hours as needed for wheezing or shortness of breath. (Patient not taking: Reported on 06/28/2017), Disp: 1 Inhaler, Rfl: 0 .  promethazine-dextromethorphan (PROMETHAZINE-DM) 6.25-15 MG/5ML syrup, Take 5 mLs by mouth 4 (four) times daily as needed for cough. (Patient not taking: Reported on 06/28/2017), Disp: 180 mL, Rfl: 0  Allergies  Allergen Reactions  . Penicillins Rash    ROS  Constitutional: Negative for fever or weight change.  Respiratory: Negative for cough and shortness of breath.   Cardiovascular: Negative for chest pain or  palpitations.  Gastrointestinal: See HPI Musculoskeletal: Negative for gait problem or joint swelling.  Skin: Negative for rash.  Neurological: Negative for dizziness or headache.  No other specific complaints in a complete review of systems (except as listed in HPI above).  Objective  Vitals:   06/28/17 1529  BP: 110/68  Pulse: 73  Resp: 18  Temp: 98 F (36.7 C)  TempSrc: Oral  SpO2: 96%  Weight: 232 lb 14.4 oz (105.6 kg)  Height: 5\' 6"  (1.676 m)   Body mass index is 37.59 kg/m.  Nursing Note and Vital Signs reviewed.  Physical Exam  Constitutional: Patient appears well-developed and well-nourished. Obese. No distress.  HEENT: head atraumatic, normocephalic Cardiovascular: Normal rate, regular rhythm, S1/S2 present.  No murmur or rub heard. No BLE edema. Pulmonary/Chest: Effort normal and breath sounds clear. No respiratory distress or retractions. Abdominal: Soft and generalized tenderness - worse Periumbilical and RLQ, mild guarding noted in the periumbilical area; no rebound tenderness, no HSM.  Bowel sounds present x4 quadrants.  Psychiatric: Patient has a normal mood and affect. behavior is normal. Judgment and thought content normal.  No results found for this or any previous visit (from the past 72 hour(s)).  Assessment & Plan  1. Generalized abdominal pain - Advised that I recommend ER for evaluation as he is exhibiting mild guarding and I am concerned for appendicitis.  He declines this as his course of  pain has been improving over the last 24 hours.  I advised that I at least recommend STAT imaging and labs - he is agreeable to labs today, but requests that we wait on CT scan until tomorrow morning to see if he continues to improve.  Discussed risk/benefit of prolonging imaging and he verbalizes understanding; pt's vitals are stable in office today.  Red flags are discussed in detail, and if symptoms worsen in any way he will present for emergency care  immediately. - ondansetron (ZOFRAN) 4 MG tablet; Take 1 tablet (4 mg total) by mouth every 8 (eight) hours as needed for nausea or vomiting.  Dispense: 20 tablet; Refill: 0 - CT Abdomen Pelvis W Contrast; Future - CBC - BASIC METABOLIC PANEL WITH GFR  2. Nausea - ondansetron (ZOFRAN) 4 MG tablet; Take 1 tablet (4 mg total) by mouth every 8 (eight) hours as needed for nausea or vomiting.  Dispense: 20 tablet; Refill: 0  -Red flags and when to present for emergency care or RTC including fever >101.22F, chest pain, shortness of breath, new/worsening/un-resolving symptoms, increase in nausea, occurrence of vomiting, increase in abdominal pain reviewed with patient at time of visit. Follow up and care instructions discussed and provided in AVS.

## 2017-06-29 ENCOUNTER — Ambulatory Visit
Admission: RE | Admit: 2017-06-29 | Discharge: 2017-06-29 | Disposition: A | Discharge status: Home / Self Care | Payer: BC Managed Care – PPO | Source: Ambulatory Visit | Attending: Family Medicine | Admitting: Family Medicine

## 2017-06-29 ENCOUNTER — Telehealth: Payer: Self-pay | Admitting: Emergency Medicine

## 2017-06-29 DIAGNOSIS — R110 Nausea: Secondary | ICD-10-CM

## 2017-06-29 DIAGNOSIS — N281 Cyst of kidney, acquired: Secondary | ICD-10-CM | POA: Diagnosis not present

## 2017-06-29 DIAGNOSIS — R1084 Generalized abdominal pain: Secondary | ICD-10-CM | POA: Diagnosis not present

## 2017-06-29 MED ORDER — IOPAMIDOL (ISOVUE-300) INJECTION 61%
100.0000 mL | Freq: Once | INTRAVENOUS | Status: AC | PRN
  Administered 2017-06-29: 100 mL via INTRAVENOUS

## 2017-06-29 NOTE — Telephone Encounter (Signed)
Called patient and left message for him regarding CT Scan appointment and to see how he was feeling today.

## 2017-10-20 ENCOUNTER — Ambulatory Visit: Payer: BC Managed Care – PPO | Admitting: Nurse Practitioner

## 2017-10-23 ENCOUNTER — Ambulatory Visit: Payer: BC Managed Care – PPO | Admitting: Nurse Practitioner

## 2017-10-23 ENCOUNTER — Encounter: Payer: Self-pay | Admitting: Nurse Practitioner

## 2017-10-23 VITALS — BP 122/80 | HR 60 | Temp 97.4°F | Resp 16 | Ht 69.5 in | Wt 231.6 lb

## 2017-10-23 DIAGNOSIS — M25511 Pain in right shoulder: Secondary | ICD-10-CM | POA: Diagnosis not present

## 2017-10-23 MED ORDER — MELOXICAM 7.5 MG PO TABS: 7.5000 mg | ORAL_TABLET | Freq: Every day | ORAL | 0 refills | Status: DC

## 2017-10-23 NOTE — Patient Instructions (Addendum)
-   Rest right shoulder for 1 week; light stretching; gradually increase activity in shoulder - Ice 20 minutes at a time for 3-5 times a day - Heat as needed for pain - Meloxicam once a day for the next week ( DO not take ibuprofen or aleve with this medicine you can take acetaminophen or tylenol) - Return if unimproved or worsening, or weakness- [facility]   Shoulder Pain Many things can cause shoulder pain, including:  An injury.  Moving the arm in the same way again and again (overuse).  Joint pain (arthritis).  Follow these instructions at home: Take these actions to help with your pain:  Squeeze a soft ball or a foam pad as much as you can. This helps to prevent swelling. It also makes the arm stronger.  Take over-the-counter and prescription medicines only as told by your doctor.  If told, put ice on the area: ? Put ice in a plastic bag. ? Place a towel between your skin and the bag. ? Leave the ice on for 20 minutes, 2-3 times per day. Stop putting on ice if it does not help with the pain.  If you were given a shoulder sling or immobilizer: ? Wear it as told. ? Remove it to shower or bathe. ? Move your arm as little as possible. ? Keep your hand moving. This helps prevent swelling.  Contact a doctor if:  Your pain gets worse.  Medicine does not help your pain.  You have new pain in your arm, hand, or fingers. Get help right away if:  Your arm, hand, or fingers: ? Tingle. ? Are numb. ? Are swollen. ? Are painful. ? Turn white or blue. This information is not intended to replace advice given to you by your health care provider. Make sure you discuss any questions you have with your health care provider. Document Released: 09/28/2007 Document Revised: 12/06/2015 Document Reviewed: 08/04/2014 Elsevier Interactive Patient Education  [employer]2018 [employer]

## 2017-10-23 NOTE — Progress Notes (Addendum)
Name: Gregory Matthews   MRN: [id number]    DOB: 06-May-1980   Date:10/23/2017       Progress Note  Subjective  Chief Complaint  Chief Complaint  Patient presents with  . Shoulder Pain    right    HPI  Right shoulder pain started 4 weeks ago- no specific injury just hurt more when doing push ups. 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 States plank position exacerbates the shoulder. Doesn't bother him at rash.  Denies weakness, no radiation. Has tried ice and ibuprofen- ice helps some, ibuprofen- doesn't take it often but didn't notice a difference.   Patient Active Problem List   Diagnosis Date Noted  . Headache, migraine 06/08/2015  . Obesity (BMI 30.0-34.9) 06/08/2015  . Allergic rhinitis, seasonal 07/26/2013    Past Medical History:  Diagnosis Date  . Acute pharyngitis   . Hay fever   . Migraine   . Sinusitis, acute     Past Surgical History:  Procedure Laterality Date  . RHINOPLASTY    . TONSILLECTOMY      Social History   Tobacco Use  . Smoking status: Never Smoker  . Smokeless tobacco: Never Used  Substance Use Topics  . Alcohol use: No    Alcohol/week: 0.0 oz     Current Outpatient Medications:  .  albuterol (PROVENTIL HFA;VENTOLIN HFA) 108 (90 Base) MCG/ACT inhaler, Inhale 2 puffs into the lungs every 6 (six) hours as needed for wheezing or shortness of breath., Disp: 1 Inhaler, Rfl: 0 .  amitriptyline (ELAVIL) 25 MG tablet, Take 1 tablet (25 mg total) by mouth at bedtime., Disp: 90 tablet, Rfl: 0 .  meloxicam (MOBIC) 7.5 MG tablet, Take 1 tablet (7.5 mg total) by mouth daily., Disp: 30 tablet, Rfl: 0  Allergies  Allergen Reactions  . Penicillins Rash    ROS   Constitutional: Negative for fever or weight change.  Respiratory: Negative for cough and shortness of breath.   Cardiovascular: Negative for chest pain  or palpitations.  Gastrointestinal: Negative for abdominal pain, no bowel changes.  Musculoskeletal: Negative for gait problem or joint swelling.  Skin: Negative for rash.  Neurological: Negative for dizziness or headache.  No other specific complaints in a complete review of systems (except as listed in HPI above).  Objective  Vitals:   10/23/17 0953  BP: 122/80  Pulse: 60  Resp: 16  Temp: (!) 97.4 F (36.3 C)  TempSrc: Oral  SpO2: 96%  Weight: 231 lb 9.6 oz (105.1 kg)  Height: 5' 9.5" (1.765 m)     Body mass index is 33.71 kg/m.  Nursing Note and Vital Signs reviewed.  Physical Exam   Constitutional: Patient appears well-developed and well-nourished. Obese No distress.  Cardiovascular: Normal rate Pulmonary/Chest: Effort normal  MSK: full ROM, no obvious deformity, redness or bruising of right shoulder, no weakness. Tenderness noted in right anterior shoulder Psychiatric: Patient has a normal mood and affect. behavior is normal. Judgment and thought content normal.  No results found for this or any previous visit (from the past 72 hour(s)).  Assessment & Plan  1. Acute pain of right shoulder - Rest right shoulder for 1 week; light stretching; gradually increase activity in shoulder - Ice 20 minutes at a time for 3-5 times a day - Heat as needed for pain - Meloxicam once a day for the next  week ( DO not take ibuprofen or aleve with this medicine you can take acetaminophen or tylenol) - Return if unimproved or worsening, or weakness- can also go to [facility]  - meloxicam (MOBIC) 7.5 MG tablet; Take 1 tablet (7.5 mg total) by mouth daily.  Dispense: 30 tablet; Refill: 0    -Red flags and when to present for emergency care or RTC including fever >101.27F, chest pain, shortness of breath, new/worsening/un-resolving symptoms, reviewed with patient at time of visit. Follow up and care instructions discussed and provided in  AVS.  ------------------------------------ I have reviewed this encounter including the documentation in this note and/or discussed this patient with the provider, Sharyon Cable DNP AGNP-C. I am certifying that I agree with the content of this note as supervising physician. Baruch Gouty, MD [facility] 10/25/2017, 2:47 PM

## 2018-01-17 ENCOUNTER — Ambulatory Visit: Payer: BC Managed Care – PPO | Admitting: Nurse Practitioner

## 2018-01-19 ENCOUNTER — Encounter: Payer: Self-pay | Admitting: Nurse Practitioner

## 2018-01-19 ENCOUNTER — Ambulatory Visit: Payer: BC Managed Care – PPO | Admitting: Nurse Practitioner

## 2018-01-19 VITALS — BP 110/78 | HR 65 | Temp 97.8°F | Resp 16 | Ht 69.5 in | Wt 227.5 lb

## 2018-01-19 DIAGNOSIS — M25552 Pain in left hip: Secondary | ICD-10-CM

## 2018-01-19 DIAGNOSIS — M5432 Sciatica, left side: Secondary | ICD-10-CM | POA: Diagnosis not present

## 2018-01-19 MED ORDER — MELOXICAM 7.5 MG PO TABS: 7.5000 mg | ORAL_TABLET | Freq: Every day | ORAL | 0 refills | Status: DC

## 2018-01-19 MED ORDER — TIZANIDINE HCL 2 MG PO CAPS: 2.0000 mg | ORAL_CAPSULE | Freq: Two times a day (BID) | ORAL | 0 refills | Status: DC | PRN

## 2018-01-19 MED ORDER — PREDNISONE 10 MG (48) PO TBPK: ORAL_TABLET | ORAL | 0 refills | Status: DC

## 2018-01-19 NOTE — Patient Instructions (Addendum)
-   Start prednisone, please take with food. Be aware it does make your more hungry so be sure to eat healthy foods.  - Take muscle relaxer as needed to help your relax muscles; best to take it at least 30 minutes before stretching - Use heat for 20 minutes prior to stretching and ice for 20 minutes after stretching. - Do stretches as tolerated; if they cause pain do not do them. - If needing further pain management can use meloxicam sparingly while on the prednisone  - Drink plenty of water - Rest you leg and hip muscles for at least one week- no strenous activity using your legs and lower back.  - If unimproved or worsening let us know and we can refer your to physical therapy or orthopedics.

## 2018-01-19 NOTE — Progress Notes (Signed)
Name: Gregory Matthews   MRN: [id number]    DOB: 05/23/1980   Date:01/19/2018       Progress Note  Subjective  Chief Complaint  Chief Complaint  Patient presents with  . Hip Pain    HPI  Patient endorses left hip starting 10 days ago. States did lower body core work-out prior to this pain; noted bilateral hip tightness states held off on leg day workout that week; a few days later was playing softball turned wrong and felt sudden burning pain in left hip, able to finish playing gain. After game patient bent down and pain was became excruciating. Treated with ice, meloxicam for about 3 days pain improved. No pain walking now but pain with bending unable to put socks on. Sharp throbbing pain that starts at outer left hip and mid-buttocks that shoots down to mid-quad.   Patient Active Problem List   Diagnosis Date Noted  . Headache, migraine 06/08/2015  . Obesity (BMI 30.0-34.9) 06/08/2015  . Allergic rhinitis, seasonal 07/26/2013    Past Medical History:  Diagnosis Date  . Acute pharyngitis   . Hay fever   . Migraine   . Sinusitis, acute     Past Surgical History:  Procedure Laterality Date  . RHINOPLASTY    . TONSILLECTOMY      Social History   Tobacco Use  . Smoking status: Never Smoker  . Smokeless tobacco: Never Used  Substance Use Topics  . Alcohol use: No    Alcohol/week: 0.0 standard drinks     Current Outpatient Medications:  .  albuterol (PROVENTIL HFA;VENTOLIN HFA) 108 (90 Base) MCG/ACT inhaler, Inhale 2 puffs into the lungs every 6 (six) hours as needed for wheezing or shortness of breath., Disp: 1 Inhaler, Rfl: 0 .  amitriptyline (ELAVIL) 25 MG tablet, Take 1 tablet (25 mg total) by mouth at bedtime., Disp: 90 tablet, Rfl: 0 .  meloxicam (MOBIC) 7.5 MG tablet, Take 1 tablet (7.5 mg total) by mouth daily., Disp: 30 tablet, Rfl: 0  Allergies  Allergen Reactions  . Penicillins Rash    ROS   No other specific complaints in a complete review of systems  (except as listed in HPI above).  Objective  Vitals:   01/19/18 1555  BP: 110/78  Pulse: 65  Resp: 16  Temp: 97.8 F (36.6 C)  TempSrc: Oral  SpO2: 98%  Weight: 227 lb 8 oz (103.2 kg)  Height: 5' 9.5" (1.765 m)    Body mass index is 33.11 kg/m.  Nursing Note and Vital Signs reviewed.  Physical Exam  Constitutional: He is oriented to person, place, and time. He appears well-developed and well-nourished.  HENT:  Head: Normocephalic and atraumatic.  Cardiovascular: Normal rate.  Pulmonary/Chest: Effort normal.  Musculoskeletal: Normal range of motion. He exhibits no edema, tenderness or deformity.       Left hip: Normal. He exhibits normal range of motion, normal strength, no tenderness and no bony tenderness.       Lumbar back: Normal.       Left upper leg: Normal. He exhibits no tenderness, no bony tenderness, no swelling and no edema.       Legs: Neurological: He is alert and oriented to person, place, and time. No sensory deficit. He exhibits normal muscle tone.  Skin: Skin is warm and dry. No rash noted.  Psychiatric: He has a normal mood and affect. His behavior is normal. Judgment and thought content normal.     No results found for this or  any previous visit (from the past 48 hour(s)).  Assessment & Plan  1. Pain of left hip joint - predniSONE (STERAPRED UNI-PAK 48 TAB) 10 MG (48) TBPK tablet; Take as directed  Dispense: 48 tablet; Refill: 0 - meloxicam (MOBIC) 7.5 MG tablet; Take 1 tablet (7.5 mg total) by mouth daily.  Dispense: 30 tablet; Refill: 0 - tizanidine (ZANAFLEX) 2 MG capsule; Take 1 capsule (2 mg total) by mouth 2 (two) times daily as needed for muscle spasms.  Dispense: 30 capsule; Refill: 0  2. Left sciatic nerve pain - predniSONE (STERAPRED UNI-PAK 48 TAB) 10 MG (48) TBPK tablet; Take as directed  Dispense: 48 tablet; Refill: 0 - meloxicam (MOBIC) 7.5 MG tablet; Take 1 tablet (7.5 mg total) by mouth daily.  Dispense: 30 tablet; Refill: 0 -  tizanidine (ZANAFLEX) 2 MG capsule; Take 1 capsule (2 mg total) by mouth 2 (two) times daily as needed for muscle spasms.  Dispense: 30 capsule; Refill: 0   - Start prednisone, please take with food. Be aware it does make your more hungry so be sure to eat healthy foods.  - Take muscle relaxer as needed to help your relax muscles; best to take it at least 30 minutes before stretching - Use heat for 20 minutes prior to stretching and ice for 20 minutes after stretching. - Do stretches as tolerated; if they cause pain do not do them. - If needing further pain management can use meloxicam sparingly while on the prednisone  - Drink plenty of water - Rest you leg and hip muscles for at least one week- no strenous activity using your legs and lower back.  - If unimproved or worsening let us know and we can refer your to physical therapy or orthopedics.

## 2018-02-28 IMAGING — CR DG CHEST 2V
1 series · 2 of 2 positions shown · non-contrast
Comparison: Chest x-ray dated April 24, 2017.

CLINICAL DATA: Persistent cough, congestion, and chest pain.

EXAM:
CHEST  2 VIEW

[Series 1: dg chest 2 view · 0.14mm/px · 2 of 2 slices shown]
[im 1/2]
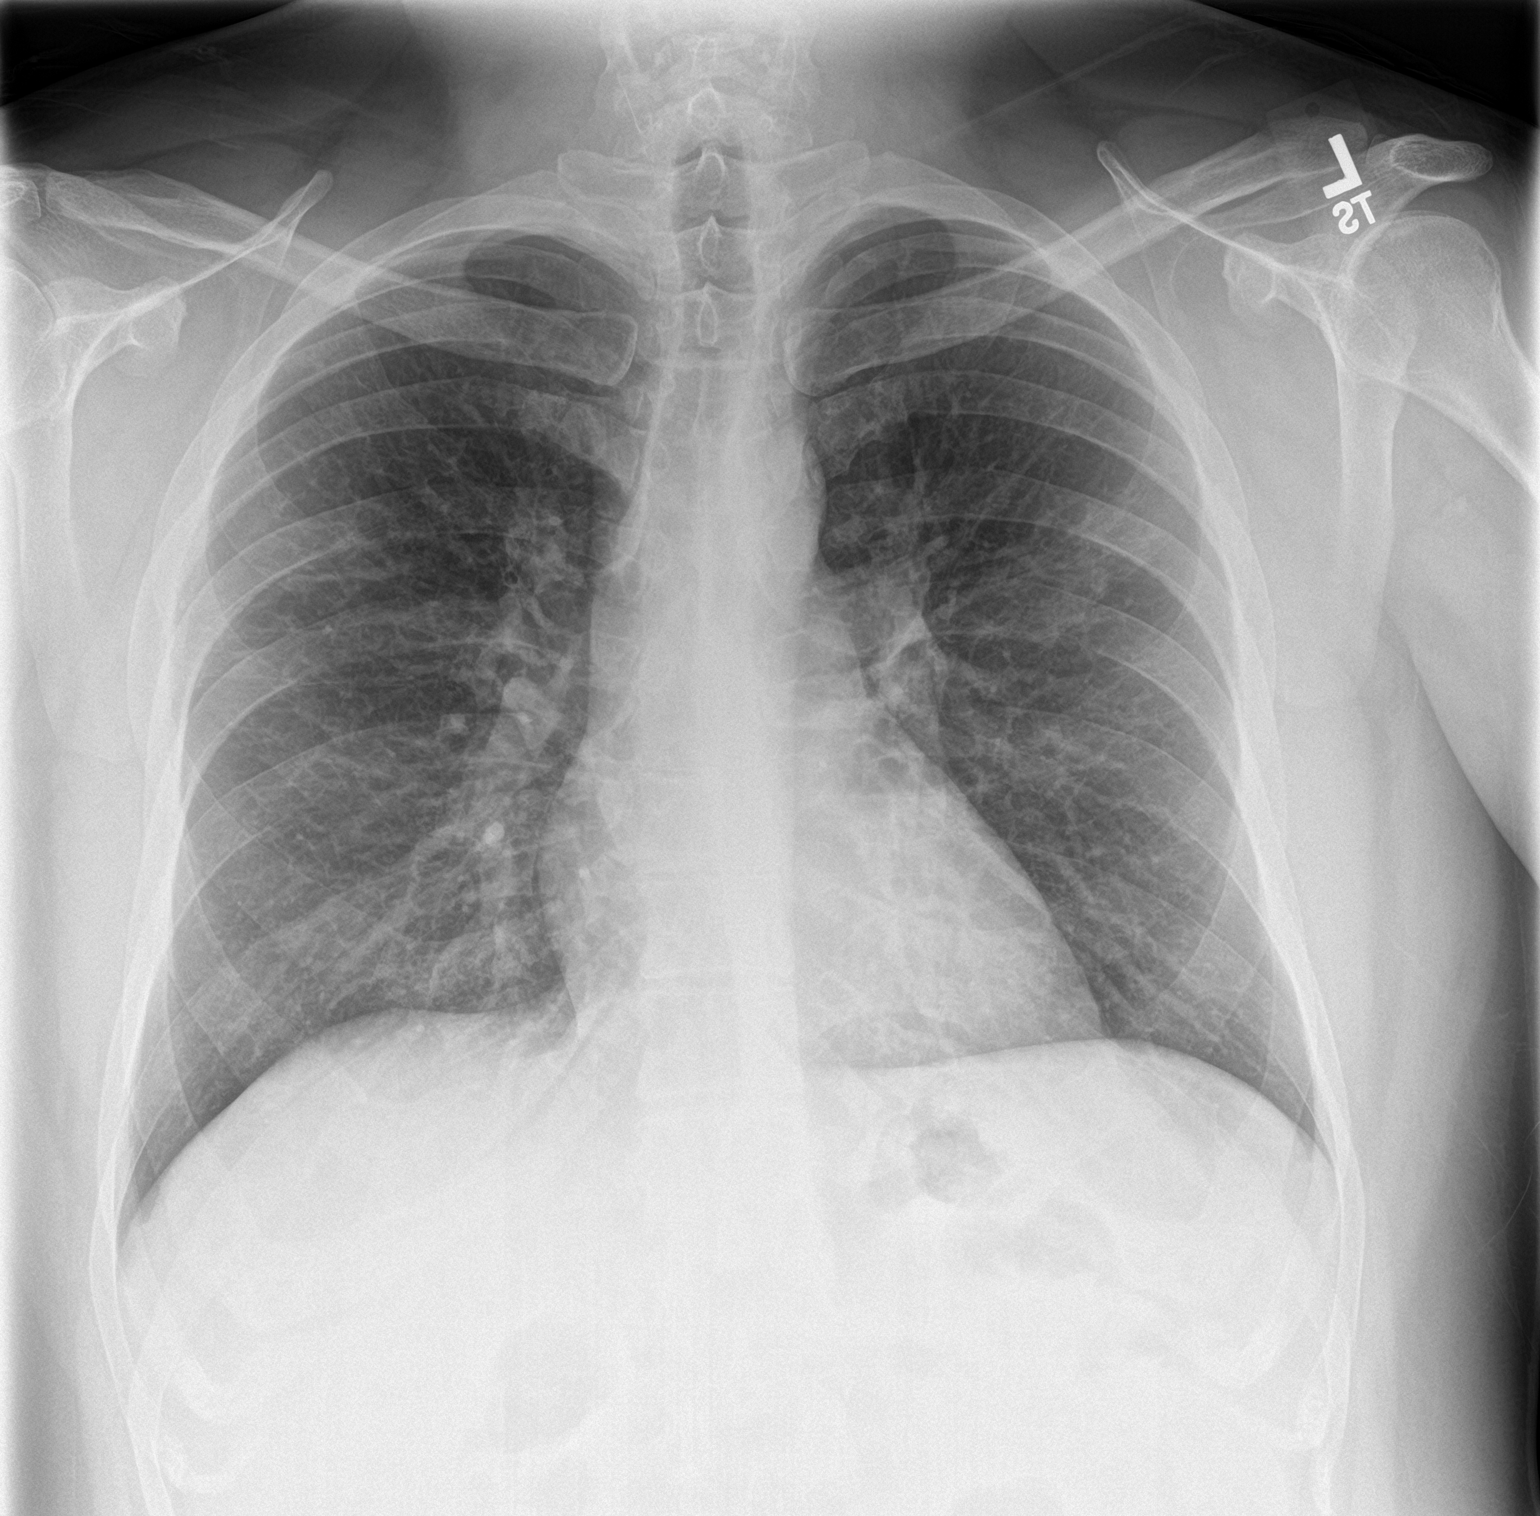
[im 2/2]
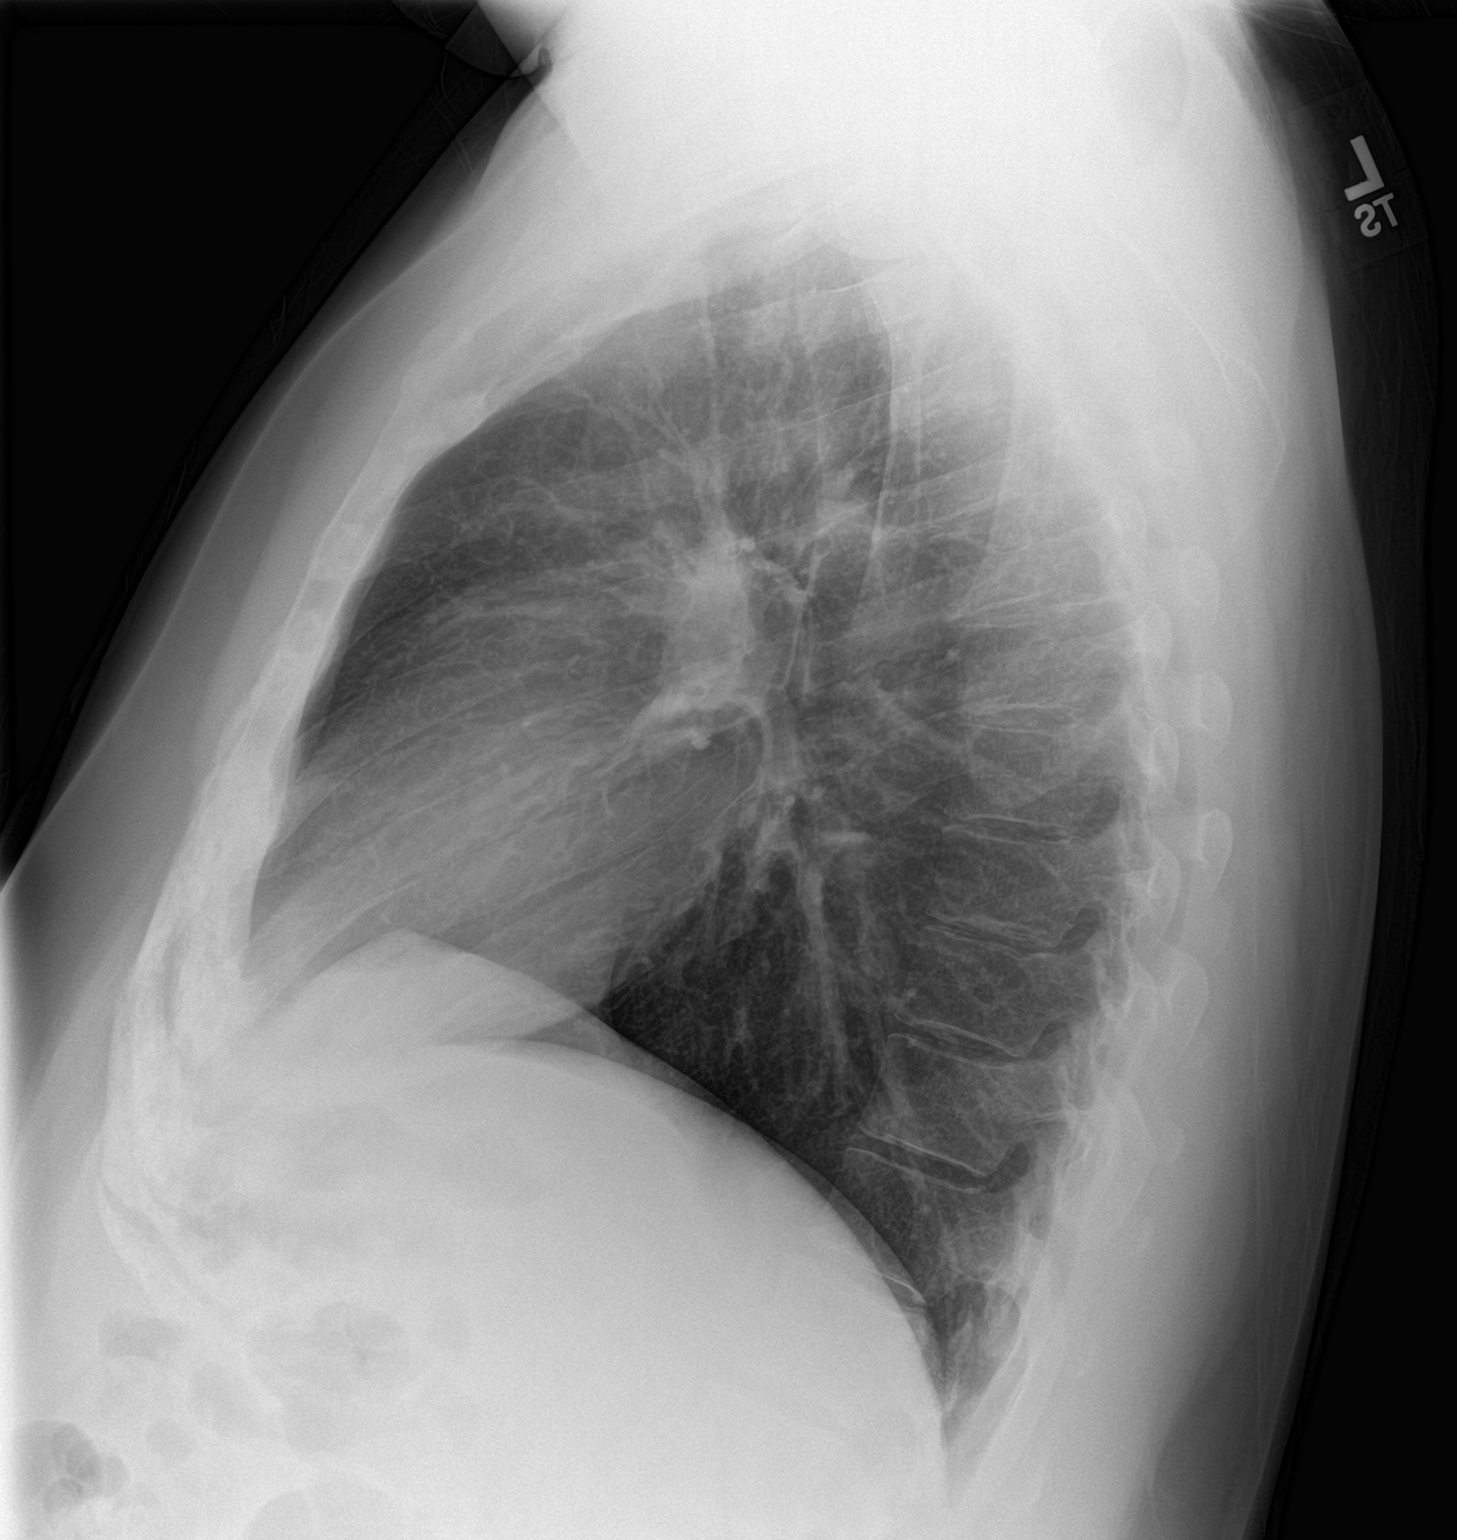

[2 of 2 positions shown; findings below may reference images not displayed]

FINDINGS: The cardiomediastinal silhouette is normal in size. Normal pulmonary
vascularity. Patchy opacity in the right upper lobe has resolved. No
focal consolidation, pleural effusion, or pneumothorax. No acute
osseous abnormality.
IMPRESSION: Resolved right upper lobe pneumonia. No active cardiopulmonary
disease.

## 2018-03-12 ENCOUNTER — Ambulatory Visit: Payer: BC Managed Care – PPO | Admitting: Nurse Practitioner

## 2018-03-12 ENCOUNTER — Encounter: Payer: Self-pay | Admitting: Nurse Practitioner

## 2018-03-12 VITALS — BP 100/66 | HR 98 | Temp 97.6°F | Resp 16 | Ht 69.5 in | Wt 219.8 lb

## 2018-03-12 DIAGNOSIS — R52 Pain, unspecified: Secondary | ICD-10-CM | POA: Diagnosis not present

## 2018-03-12 DIAGNOSIS — R197 Diarrhea, unspecified: Secondary | ICD-10-CM | POA: Diagnosis not present

## 2018-03-12 DIAGNOSIS — R509 Fever, unspecified: Secondary | ICD-10-CM

## 2018-03-12 DIAGNOSIS — R10811 Right upper quadrant abdominal tenderness: Secondary | ICD-10-CM

## 2018-03-12 DIAGNOSIS — R6883 Chills (without fever): Secondary | ICD-10-CM | POA: Diagnosis not present

## 2018-03-12 LAB — COMPLETE METABOLIC PANEL WITH GFR
AG RATIO: 1.3 (calc) (ref 1.0–2.5)
ALT: 43 U/L (ref 9–46)
AST: 22 U/L (ref 10–40)
Albumin: 4.4 g/dL (ref 3.6–5.1)
Alkaline phosphatase (APISO): 55 U/L (ref 40–115)
BILIRUBIN TOTAL: 0.4 mg/dL (ref 0.2–1.2)
BUN: 18 mg/dL (ref 7–25)
CHLORIDE: 100 mmol/L (ref 98–110)
CO2: 28 mmol/L (ref 20–32)
Calcium: 9.6 mg/dL (ref 8.6–10.3)
Creat: 1.03 mg/dL (ref 0.60–1.35)
GFR, EST AFRICAN AMERICAN: 106 mL/min/{1.73_m2} (ref >=60)
GFR, Est Non African American: 92 mL/min/{1.73_m2} (ref >=60)
Globulin: 3.3 g/dL (calc) (ref 1.9–3.7)
Glucose, Bld: 108 mg/dL (ref 65–139)
POTASSIUM: 4.2 mmol/L (ref 3.5–5.3)
Sodium: 137 mmol/L (ref 135–146)
TOTAL PROTEIN: 7.7 g/dL (ref 6.1–8.1)

## 2018-03-12 LAB — CBC WITH DIFFERENTIAL/PLATELET
BASOS ABS: 24 {cells}/uL (ref 0–200)
BASOS PCT: 0.2 %
EOS ABS: 61 {cells}/uL (ref 15–500)
Eosinophils Relative: 0.5 %
HCT: 46 % (ref 38.5–50.0)
Hemoglobin: 16.2 g/dL (ref 13.2–17.1)
Lymphs Abs: 1500 cells/uL (ref 850–3900)
MCH: 29.8 pg (ref 27.0–33.0)
MCHC: 35.2 g/dL (ref 32.0–36.0)
MCV: 84.6 fL (ref 80.0–100.0)
MPV: 8.8 fL (ref 7.5–12.5)
Monocytes Relative: 8.2 %
NEUTROS PCT: 78.7 %
Neutro Abs: 9523 cells/uL — ABNORMAL HIGH (ref 1500–7800)
PLATELETS: 354 10*3/uL (ref 140–400)
RBC: 5.44 10*6/uL (ref 4.20–5.80)
RDW: 12 % (ref 11.0–15.0)
TOTAL LYMPHOCYTE: 12.4 %
WBC mixed population: 992 cells/uL — ABNORMAL HIGH (ref 200–950)
WBC: 12.1 10*3/uL — ABNORMAL HIGH (ref 3.8–10.8)

## 2018-03-12 LAB — POCT INFLUENZA A/B
Influenza A, POC: NEGATIVE
Influenza B, POC: NEGATIVE

## 2018-03-12 MED ORDER — OSELTAMIVIR PHOSPHATE 75 MG PO CAPS: 75.0000 mg | ORAL_CAPSULE | Freq: Two times a day (BID) | ORAL | 0 refills | Status: AC

## 2018-03-12 NOTE — Patient Instructions (Signed)
- please drink plenty of fluids- mostly water and some electrolyte replacement (gatorade, Pedialyte) to ensure hydration and urine is pale yellow - Take tamiflu twice a day for the next 5 days - Can take acetaminophen 500mg  q 6 hours for pain and fever, can additionally take ibuprofen or aleve if needed for pain or fever - let us know if you need nausea medicine  - Can take lopermide or peptobismal for diarrhea - If having worsening abdominal pain, palpitations please come here ASAP or to ER.  ________________________ How do I treat it? -Take nausea medicine as needed for nausea. If needed you can take loperamide (Imodium) for diarrhea, but avoid taking it for more than 2 days at a time as it can then lead to constipation.  -Drink fluids and get plenty of rest. Do not consume alcohol or caffeine. Avoid medications containing aspirin or ibuprofen, which may irritate your stomach, and do not take any medications by mouth unless directed by your medical care provider. 1. Drink clear liquids. Sip water/half-strength sports drinks or suck on ice chips. If you vomit using this treatment, do not take anything for 1 hour and start over again. 2. If you do not vomit fluids, you may progress to full-strength sports drinks; popsicles; clear broth; bouillon; decaf tea; clear apple juice; plain-flavored gelatin; and half-strength, clear, carbonated beverages without fizz (ginger ale, lemon-lime sodas, etc.). NOTE: To remove the fizz from soda, pour some into a glass and stir with a spoon. 3. As you become hungry, try moving to soft foods. Some examples include: saltine crackers, dry white bread/toast, bananas, apple sauce, plain white rice, soft cereals prepared with water, plain noodles and broth soups. Do not use sauces or condiments, including butter. You may return to a normal diet as tolerated within 24 hours after recovery from vomiting. Recommended diets THINGS TO AVOID WHILE RECOVERING: . Alcohol   . Caffeine  . Dairy products  . Citrus products  . Fatty, greasy and/or fried foods  . Raw fruits and vegetables  . Aspirin  . Ibuprofen CLEAR LIQUID DIET: . Apple, grape or cranberry juice  . Kool-Aid  . Fruit punch  . Gatorade  . Ginger ale or 7UP  . Decaf tea  . Clear bouillon  . Jello  . Popsicles  . Fruit ice  . Salt FULL LIQUID DIET: . Cocoa  . Carbonated, decaf beverages  . Broth  . Strained, bland soups  . Cream of wheat or rice cereals  . Denzil Magnuson  . Vegetable juices  . Strained fruit juices or nectars  . Sherbets  . Honey  . Cinnamon  . Nutmeg  . Vanilla or other extracts CLEAR LIQUID DIET, PLUS: . Coffee  . White bread or toast  . Cooked or ready-to-eat cereal (no bran)  . Graham crackers  . Saltines  . Pasta or rice  . Soft, cooked vegetables  . Boiled or mashed potatoes  . Apple sauce  . Bananas or seedless melon  . Cooked or canned fruits  . Mild cheese or cottage cheese SOFT FULL LIQUID DIET, PLUS: . Soft-cooked, poached or hard-boiled or scrambled eggs  . Tender meat, fish or poultry  . Soft cake or cookies without nuts or raisins  . Butter, cream or margarine  . Jelly  SEEK MEDICAL TREATMENT IF: . You are unable to keep fluids down.  . You see blood or mucus in your stool.  . You vomit black or dark red material.  . You have a fever  of 101?F (38.33?C) or higher.  . You have localized and/or persistent abdominal pain.

## 2018-03-12 NOTE — Progress Notes (Signed)
cbcNameHeber Matthews: Gregory Matthews   MRN: [id number]    DOB: December 30, 1980   Date:03/12/2018       Progress Note  Subjective  Chief Complaint  Chief Complaint  Patient presents with  . Fever    HAD 100.7 TEMP LAST NIGHT.   Marland Kitchen. Abdominal Pain  . Diarrhea    HPI States dad and wife had similar symptoms 7 days ago; states has had fevers, body aches, chills, abdominal cramping, diarrhea, nausea- no vomiting. States has had too many episodes of diarrhea to count. Denies chest pain, shob, palpitations, sinus pressure, cough, rhinorrhea.   Took nyquil last night.  Did not receive flu shot.   Patient Active Problem List   Diagnosis Date Noted  . Headache, migraine 06/08/2015  . Obesity (BMI 30.0-34.9) 06/08/2015  . Allergic rhinitis, seasonal 07/26/2013    Past Medical History:  Diagnosis Date  . Acute pharyngitis   . Hay fever   . Migraine   . Sinusitis, acute     Past Surgical History:  Procedure Laterality Date  . RHINOPLASTY    . TONSILLECTOMY      Social History   Tobacco Use  . Smoking status: Never Smoker  . Smokeless tobacco: Never Used  Substance Use Topics  . Alcohol use: No    Alcohol/week: 0.0 standard drinks     Current Outpatient Medications:  .  albuterol (PROVENTIL HFA;VENTOLIN HFA) 108 (90 Base) MCG/ACT inhaler, Inhale 2 puffs into the lungs every 6 (six) hours as needed for wheezing or shortness of breath. (Patient not taking: Reported on 03/12/2018), Disp: 1 Inhaler, Rfl: 0 .  amitriptyline (ELAVIL) 25 MG tablet, Take 1 tablet (25 mg total) by mouth at bedtime. (Patient not taking: Reported on 03/12/2018), Disp: 90 tablet, Rfl: 0 .  meloxicam (MOBIC) 7.5 MG tablet, Take 1 tablet (7.5 mg total) by mouth daily. (Patient not taking: Reported on 03/12/2018), Disp: 30 tablet, Rfl: 0 .  predniSONE (STERAPRED UNI-PAK 48 TAB) 10 MG (48) TBPK tablet, Take as directed (Patient not taking: Reported on 03/12/2018), Disp: 48 tablet, Rfl: 0 .  tizanidine (ZANAFLEX) 2 MG  capsule, Take 1 capsule (2 mg total) by mouth 2 (two) times daily as needed for muscle spasms. (Patient not taking: Reported on 03/12/2018), Disp: 30 capsule, Rfl: 0  Allergies  Allergen Reactions  . Penicillins Rash    ROS  No other specific complaints in a complete review of systems (except as listed in HPI above).  Objective  Vitals:   03/12/18 1009  BP: 100/66  Pulse: 98  Resp: 16  Temp: 97.6 F (36.4 C)  TempSrc: Oral  SpO2: 98%  Weight: 219 lb 12.8 oz (99.7 kg)  Height: 5' 9.5" (1.765 m)     Body mass index is 31.99 kg/m.  Nursing Note and Vital Signs reviewed.  Physical Exam  Constitutional: He is oriented to person, place, and time. He appears well-developed and well-nourished.  HENT:  Head: Normocephalic and atraumatic.  Mouth/Throat: Oropharynx is clear and moist. No oropharyngeal exudate.  Eyes: Pupils are equal, round, and reactive to light. EOM are normal.  Neck: Normal range of motion. Neck supple. Carotid bruit is not present.  Cardiovascular: Normal heart sounds and intact distal pulses.  Pulmonary/Chest: Effort normal and breath sounds normal.  Abdominal: Soft. Bowel sounds are normal. There is tenderness in the right upper quadrant and right lower quadrant. There is no rebound, no CVA tenderness and no tenderness at McBurney's point.  Musculoskeletal: Normal range of motion.  Neurological: He  is alert and oriented to person, place, and time. He has normal strength. No sensory deficit. GCS eye subscore is 4. GCS verbal subscore is 5. GCS motor subscore is 6.  Skin: Skin is warm, dry and intact. Capillary refill takes less than 2 seconds. He is not diaphoretic.  Psychiatric: He has a normal mood and affect. His speech is normal and behavior is normal. Judgment and thought content normal.  Vitals reviewed.    No results found for this or any previous visit (from the past 48 hour(s)).  Assessment & Plan  1. Generalized body aches - POCT Influenza  A/B - oseltamivir (TAMIFLU) 75 MG capsule; Take 1 capsule (75 mg total) by mouth 2 (two) times daily for 5 days.  Dispense: 10 capsule; Refill: 0  2. Diarrhea of presumed infectious origin - POCT Influenza A/B - oseltamivir (TAMIFLU) 75 MG capsule; Take 1 capsule (75 mg total) by mouth 2 (two) times daily for 5 days.  Dispense: 10 capsule; Refill: 0 - COMPLETE METABOLIC PANEL WITH GFR  3. Chills - POCT Influenza A/B - oseltamivir (TAMIFLU) 75 MG capsule; Take 1 capsule (75 mg total) by mouth 2 (two) times daily for 5 days.  Dispense: 10 capsule; Refill: 0 - COMPLETE METABOLIC PANEL WITH GFR  4. Fever, unspecified fever cause Discussed OTC management  - POCT Influenza A/B - oseltamivir (TAMIFLU) 75 MG capsule; Take 1 capsule (75 mg total) by mouth 2 (two) times daily for 5 days.  Dispense: 10 capsule; Refill: 0 - COMPLETE METABOLIC PANEL WITH GFR  5. Right upper quadrant abdominal tenderness without rebound tenderness Flu swab negative, high suspicion of flu; due to abdominal tenderness will complete blood work. Declined nausea medicine, discussed OTC management, diet and ER precautions . - CBC with Differential - COMPLETE METABOLIC PANEL WITH GFR    -Red flags and when to present for emergency care or RTC including fever >101.13F, worsening abdominal pain, unable to keep down fluids, palpitations new/worsening/un-resolving symptoms, reviewed with patient at time of visit. Follow up and care instructions discussed and provided in AVS.

## 2018-03-13 ENCOUNTER — Other Ambulatory Visit: Payer: Self-pay | Admitting: Nurse Practitioner

## 2018-03-13 DIAGNOSIS — R197 Diarrhea, unspecified: Secondary | ICD-10-CM

## 2018-03-14 ENCOUNTER — Other Ambulatory Visit
Admission: RE | Admit: 2018-03-14 | Discharge: 2018-03-14 | Disposition: A | Discharge status: Home / Self Care | Payer: BC Managed Care – PPO | Source: Ambulatory Visit | Attending: Nurse Practitioner | Admitting: Nurse Practitioner

## 2018-03-14 DIAGNOSIS — R197 Diarrhea, unspecified: Secondary | ICD-10-CM

## 2018-03-15 ENCOUNTER — Telehealth: Payer: Self-pay

## 2018-03-15 ENCOUNTER — Other Ambulatory Visit: Payer: Self-pay | Admitting: Nurse Practitioner

## 2018-03-15 DIAGNOSIS — A09 Infectious gastroenteritis and colitis, unspecified: Secondary | ICD-10-CM

## 2018-03-15 LAB — GASTROINTESTINAL PANEL BY PCR, STOOL (REPLACES STOOL CULTURE)
ADENOVIRUS F40/41: NOT DETECTED
ASTROVIRUS: NOT DETECTED
Campylobacter species: NOT DETECTED
Cryptosporidium: NOT DETECTED
Cyclospora cayetanensis: NOT DETECTED
ENTEROAGGREGATIVE E COLI (EAEC): DETECTED — AB
ENTEROPATHOGENIC E COLI (EPEC): DETECTED — AB
ENTEROTOXIGENIC E COLI (ETEC): DETECTED — AB
Entamoeba histolytica: NOT DETECTED
GIARDIA LAMBLIA: NOT DETECTED
NOROVIRUS GI/GII: NOT DETECTED
Plesimonas shigelloides: NOT DETECTED
ROTAVIRUS A: NOT DETECTED
SHIGELLA/ENTEROINVASIVE E COLI (EIEC): NOT DETECTED
Salmonella species: NOT DETECTED
Sapovirus (I, II, IV, and V): NOT DETECTED
Shiga like toxin producing E coli (STEC): NOT DETECTED
Vibrio cholerae: NOT DETECTED
Vibrio species: NOT DETECTED
Yersinia enterocolitica: NOT DETECTED

## 2018-03-15 MED ORDER — AZITHROMYCIN 250 MG PO TABS: ORAL_TABLET | ORAL | 0 refills | Status: DC

## 2018-03-15 NOTE — Telephone Encounter (Signed)
Copied from CRM [phone]#[id number]. Topic: Quick Communication - See Telephone Encounter >> Mar 15, 2018 11:17 AM Waymon AmatoBurton, Donna F wrote: [facility] called ot give results of pt GI panel and is needing a call back to [phone].  Called lab back and they stated that his results were Alert Value:  Enteroaggregative E-coli: EaEc  Enteropathogenic E-coli.EpEc  Enterotoxigenic E-coli. EtEc

## 2018-04-25 IMAGING — CT CT ABD-PELV W/ CM
2 of 4 series · 16 of 46 positions shown, 18 images · IV contrast (iopamidol)
Comparison: CT of the abdomen and pelvis 06/16/2009

CLINICAL DATA: No surgery. NKI. No hx of CA. Pt c/o sharp pain to
abdomen x 2 days. Pain has since eased off but pt does still c/o
pressure in abdomen. Pain started in center of abdomen but now goes
into bilateral sides.

EXAM:
CT ABDOMEN AND PELVIS WITH CONTRAST
TECHNIQUE: Multidetector CT imaging of the abdomen and pelvis was performed
using the standard protocol following bolus administration of
intravenous contrast.
CONTRAST:  100mL 0PFLZM-ERR IOPAMIDOL (0PFLZM-ERR) INJECTION 61%

[Series 2: abd pelvis · axial · 0.85mm/px · z∈[-1703,-1228]mm · 13 of 105 slices shown, 15 images (1 of 2)]
[im 5/105  soft-tissue]
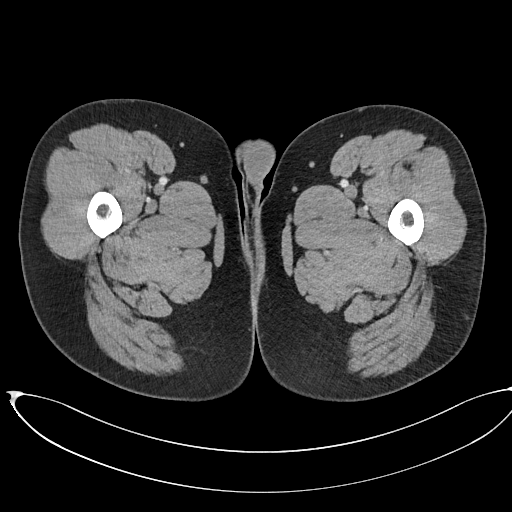
[im 5/105  bone]
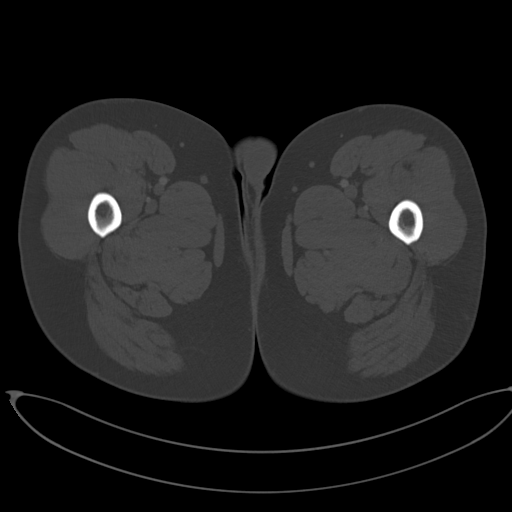
[im 14/105  soft-tissue]
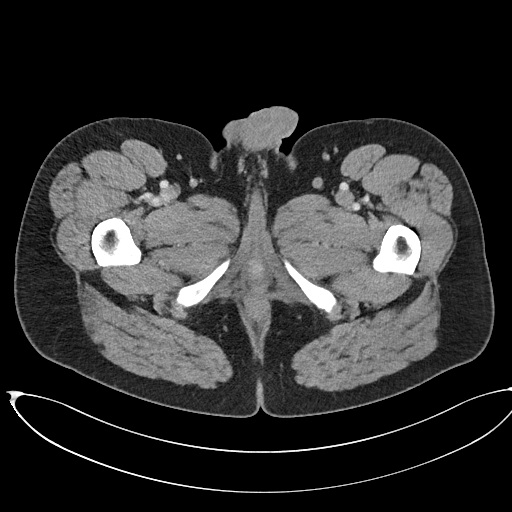
[im 22/105  soft-tissue]
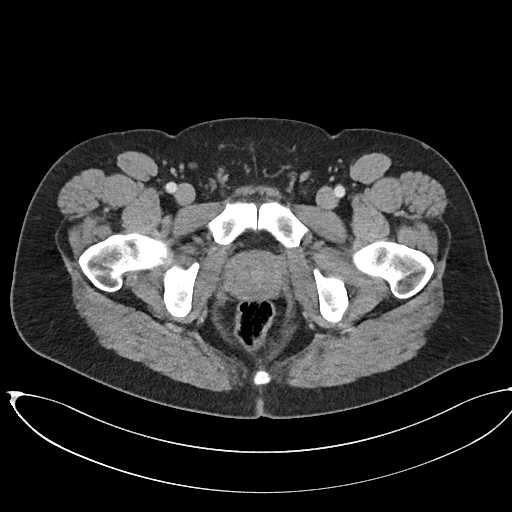
[im 31/105  soft-tissue]
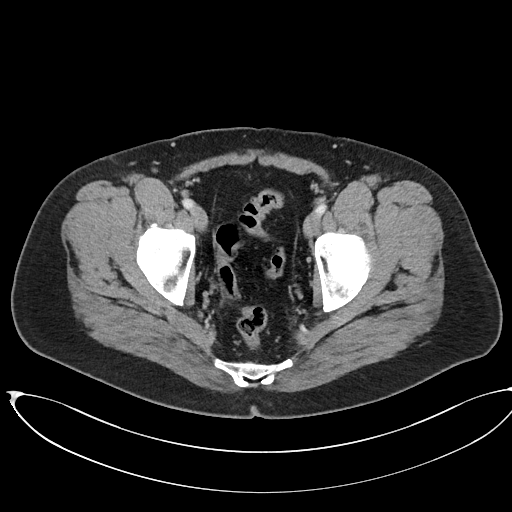
[im 35/105  soft-tissue]
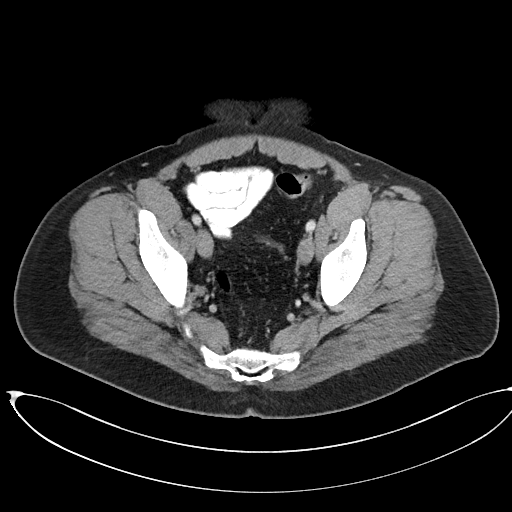
[im 44/105  soft-tissue]
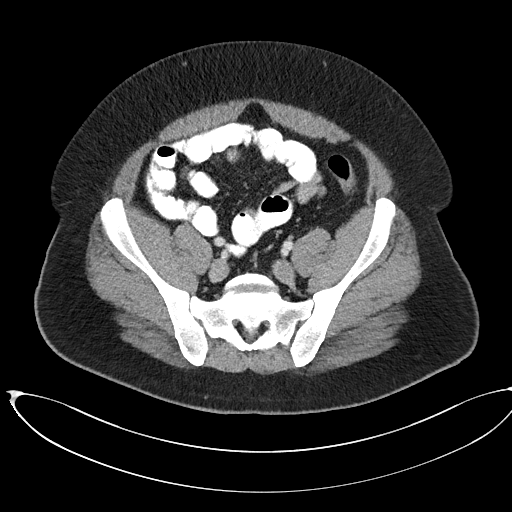
[im 53/105  soft-tissue]
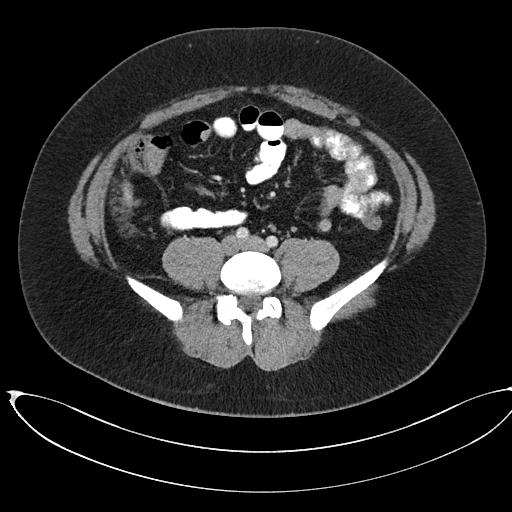
[im 61/105  soft-tissue]
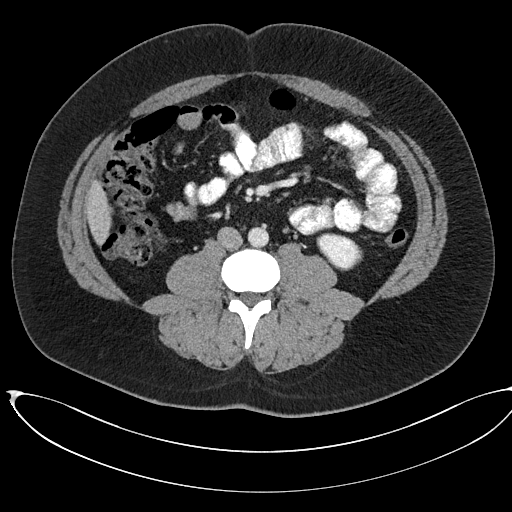
[im 70/105  soft-tissue]
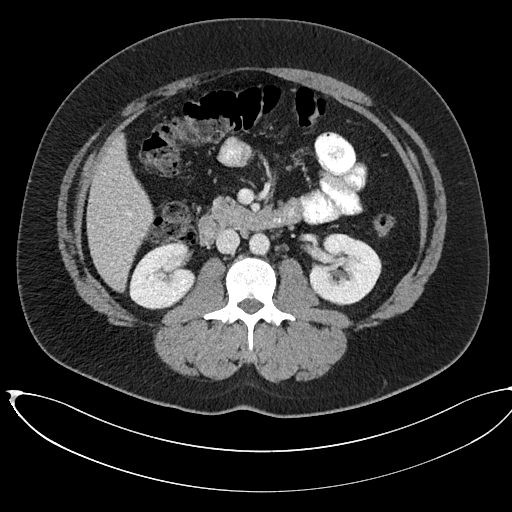
[im 70/105  bone]
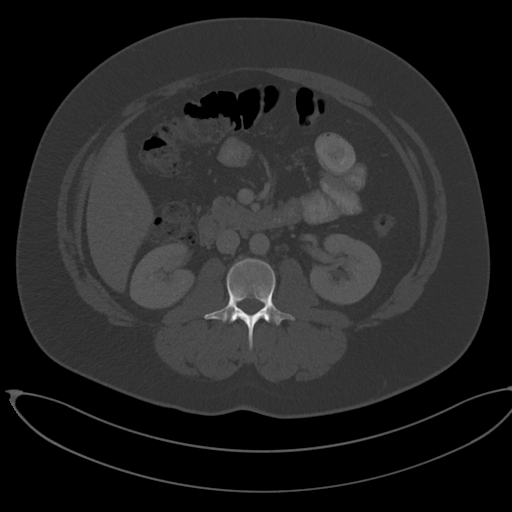
[im 74/105  soft-tissue]
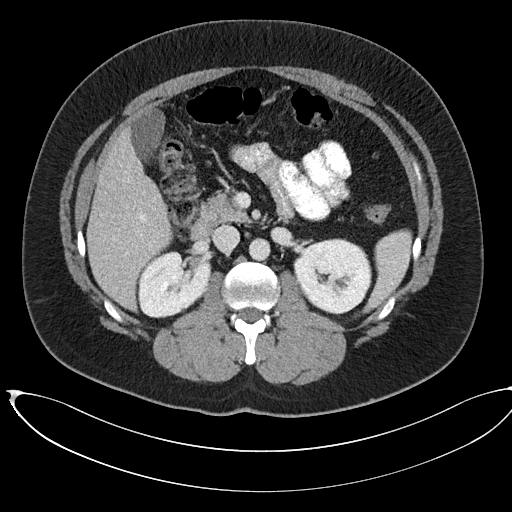
[im 83/105  soft-tissue]
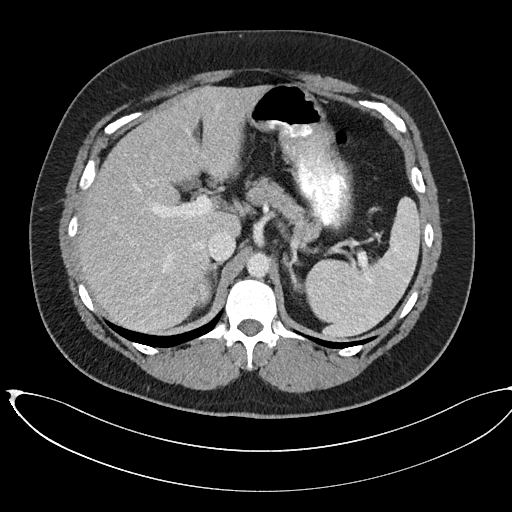
[im 92/105  soft-tissue]
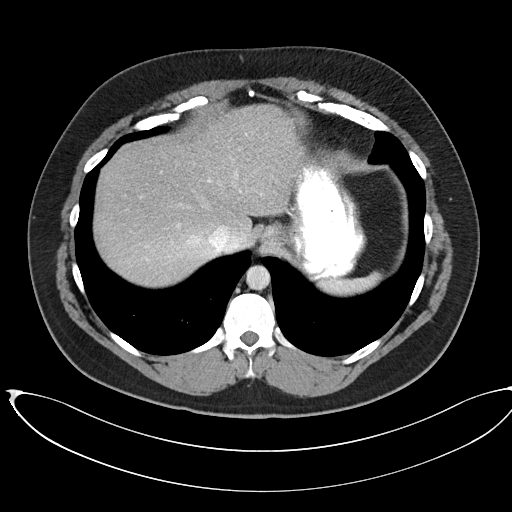
[im 100/105  soft-tissue]
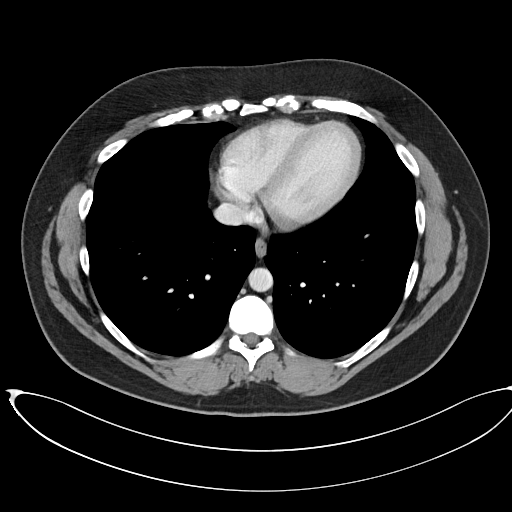

[Series 4: abd pelvis · coronal · 0.83mm/px · 3 of 171 slices shown (2 of 2)]
[im 57/171  soft-tissue]
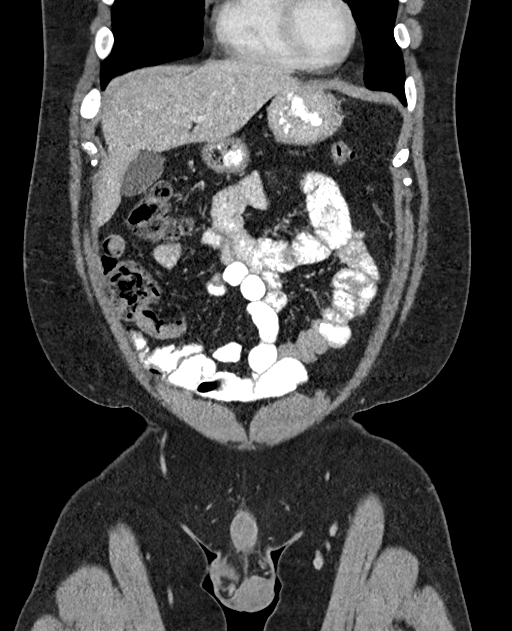
[im 76/171  soft-tissue]
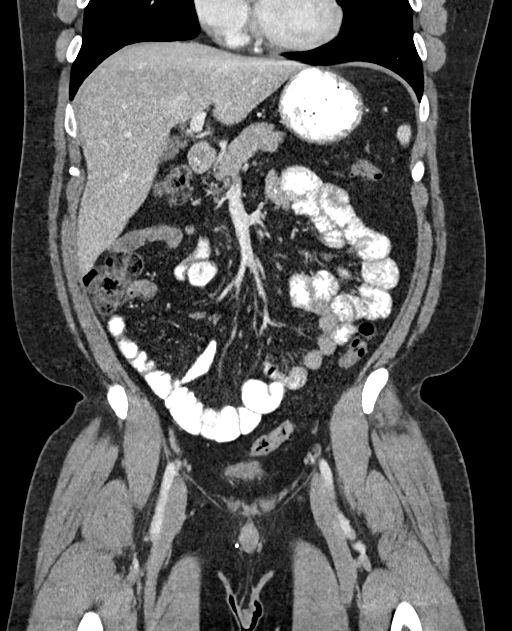
[im 95/171  soft-tissue]
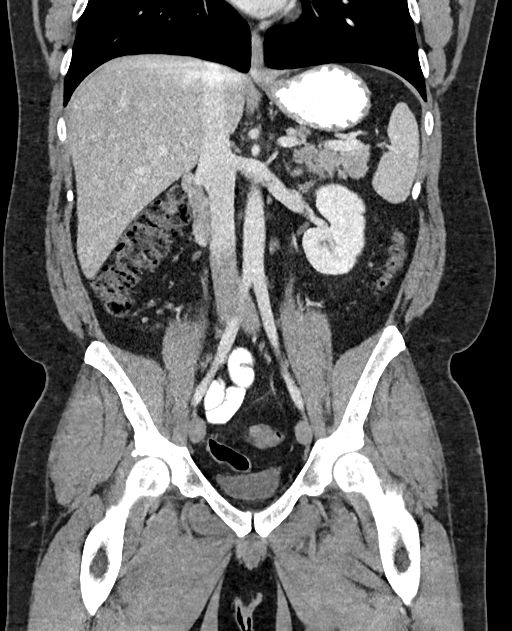

[16 of 46 positions shown; findings below may reference images not displayed]

FINDINGS: Lower chest: No acute abnormality.

Hepatobiliary: No focal liver abnormality is seen. No radiopaque
gallstones, biliary dilatation, or pericholecystic inflammatory
changes.

Pancreas: Unremarkable. No pancreatic ductal dilatation or
surrounding inflammatory changes.

Spleen: Normal in size without focal abnormality.

Adrenals/Urinary Tract: Adrenal glands are normal in appearance.
RIGHT renal cysts are present, largest measuring 1.8 centimeters. No
hydronephrosis. Urinary bladder is decompressed and normal in
appearance.

Stomach/Bowel: The stomach and small bowel loops are normal in
appearance. Colon is normal in appearance. Moderate stool burden.
Appendix is not seen.

Vascular/Lymphatic: No significant vascular findings are present. No
enlarged abdominal or pelvic lymph nodes.

Reproductive: Prostate is unremarkable.

Other: No abdominal wall hernia or abnormality. No abdominopelvic
ascites.

Musculoskeletal: No acute or significant osseous findings.
IMPRESSION: 1.  No evidence for acute  abnormality.
2. Moderate stool burden.
3. RIGHT renal cysts.

## 2018-04-27 ENCOUNTER — Ambulatory Visit
Admission: RE | Admit: 2018-04-27 | Discharge: 2018-04-27 | Disposition: A | Discharge status: Home / Self Care | Payer: BC Managed Care – PPO | Source: Ambulatory Visit | Attending: Family Medicine | Admitting: Family Medicine

## 2018-04-27 ENCOUNTER — Ambulatory Visit: Payer: BC Managed Care – PPO | Admitting: Family Medicine

## 2018-04-27 ENCOUNTER — Other Ambulatory Visit
Admission: RE | Admit: 2018-04-27 | Discharge: 2018-04-27 | Disposition: A | Discharge status: Home / Self Care | Payer: BC Managed Care – PPO | Source: Ambulatory Visit | Attending: Family Medicine | Admitting: Family Medicine

## 2018-04-27 ENCOUNTER — Encounter: Payer: Self-pay | Admitting: Family Medicine

## 2018-04-27 VITALS — BP 120/70 | HR 75 | Temp 97.8°F | Resp 16 | Ht 70.0 in | Wt 226.1 lb

## 2018-04-27 DIAGNOSIS — R110 Nausea: Secondary | ICD-10-CM | POA: Insufficient documentation

## 2018-04-27 DIAGNOSIS — R1031 Right lower quadrant pain: Secondary | ICD-10-CM | POA: Diagnosis present

## 2018-04-27 DIAGNOSIS — R197 Diarrhea, unspecified: Secondary | ICD-10-CM

## 2018-04-27 DIAGNOSIS — R945 Abnormal results of liver function studies: Secondary | ICD-10-CM | POA: Diagnosis not present

## 2018-04-27 DIAGNOSIS — R1011 Right upper quadrant pain: Secondary | ICD-10-CM | POA: Insufficient documentation

## 2018-04-27 LAB — COMPREHENSIVE METABOLIC PANEL
ALBUMIN: 4.5 g/dL (ref 3.5–5.0)
ALT: 113 U/L — ABNORMAL HIGH (ref 0–44)
AST: 48 U/L — AB (ref 15–41)
Alkaline Phosphatase: 52 U/L (ref 38–126)
Anion gap: 6 (ref 5–15)
BILIRUBIN TOTAL: 0.5 mg/dL (ref 0.3–1.2)
BUN: 14 mg/dL (ref 6–20)
CO2: 24 mmol/L (ref 22–32)
Calcium: 8.9 mg/dL (ref 8.9–10.3)
Chloride: 107 mmol/L (ref 98–111)
Creatinine, Ser: 0.74 mg/dL (ref 0.61–1.24)
GFR calc Af Amer: >60 mL/min (ref >60)
GFR calc non Af Amer: >60 mL/min (ref >60)
GLUCOSE: 94 mg/dL (ref 70–99)
POTASSIUM: 3.5 mmol/L (ref 3.5–5.1)
SODIUM: 137 mmol/L (ref 135–145)
TOTAL PROTEIN: 7.9 g/dL (ref 6.5–8.1)

## 2018-04-27 LAB — CBC WITH DIFFERENTIAL/PLATELET
ABS IMMATURE GRANULOCYTES: 0.01 10*3/uL (ref 0.00–0.07)
BASOS PCT: 0 %
Basophils Absolute: 0 10*3/uL (ref 0.0–0.1)
Eosinophils Absolute: 0.1 10*3/uL (ref 0.0–0.5)
Eosinophils Relative: 2 %
HEMATOCRIT: 45.2 % (ref 39.0–52.0)
HEMOGLOBIN: 15.6 g/dL (ref 13.0–17.0)
IMMATURE GRANULOCYTES: 0 %
LYMPHS ABS: 1.9 10*3/uL (ref 0.7–4.0)
LYMPHS PCT: 27 %
MCH: 29.5 pg (ref 26.0–34.0)
MCHC: 34.5 g/dL (ref 30.0–36.0)
MCV: 85.6 fL (ref 80.0–100.0)
MONO ABS: 0.7 10*3/uL (ref 0.1–1.0)
MONOS PCT: 9 %
NEUTROS ABS: 4.3 10*3/uL (ref 1.7–7.7)
Neutrophils Relative %: 62 %
Platelets: 322 10*3/uL (ref 150–400)
RBC: 5.28 MIL/uL (ref 4.22–5.81)
RDW: 12.4 % (ref 11.5–15.5)
WBC: 7 10*3/uL (ref 4.0–10.5)
nRBC: 0 % (ref 0.0–0.2)

## 2018-04-27 LAB — LIPASE, BLOOD: Lipase: 24 U/L (ref 11–51)

## 2018-04-27 MED ORDER — DICYCLOMINE HCL 20 MG PO TABS: 20.0000 mg | ORAL_TABLET | Freq: Four times a day (QID) | ORAL | 0 refills | Status: DC

## 2018-04-27 MED ORDER — ONDANSETRON HCL 4 MG PO TABS: 4.0000 mg | ORAL_TABLET | Freq: Three times a day (TID) | ORAL | 0 refills | Status: DC | PRN

## 2018-04-27 NOTE — Progress Notes (Signed)
Name: Gregory Matthews   MRN: [id number]    DOB: 10/09/1980   Date:04/27/2018       Progress Note  Subjective  Chief Complaint  Chief Complaint  Patient presents with  . Abdominal Pain    right side abdominal pain x 2 days, sharp and constant. Denies urinary symptoms.    HPI  Pt presents with concern for diarrhea and abdominal pain.  4 days ago started feeling feverish, achey joints, diarrhea. The next day fever subsided, but diarrhea persisted.  On day 3 he had ongoing watery diarrhea.  Day 4 he developed RIGHT upper quandrant pain, has decreased appetite/feels "full" all the time, having increased flatus.  Endorses nausea, no vomiting. No blood in stool; no hematuria, no history of kidney stones. No recent sick contacts.  He was treated for e. Coli in November with azithromycin and did get completely better. Does feel similar to that illness except he did not have abdominal pain the last time.  Patient Active Problem List   Diagnosis Date Noted  . Headache, migraine 06/08/2015  . Obesity (BMI 30.0-34.9) 06/08/2015  . Allergic rhinitis, seasonal 07/26/2013    Social History   Tobacco Use  . Smoking status: Never Smoker  . Smokeless tobacco: Never Used  Substance Use Topics  . Alcohol use: No    Alcohol/week: 0.0 standard drinks     Current Outpatient Medications:  .  azithromycin (ZITHROMAX) 250 MG tablet, Please take 2 tablets (500mg ) daily for the next three days. (Patient not taking: Reported on 04/27/2018), Disp: 6 tablet, Rfl: 0  Allergies  Allergen Reactions  . Penicillins Rash    I personally reviewed active problem list, medication list, allergies, lab results with the patient/caregiver today.  ROS  Constitutional: Negative for fever or weight change.  Respiratory: Negative for cough and shortness of breath.   Cardiovascular: Negative for chest pain or palpitations.  Gastrointestinal: Negative for abdominal pain, no bowel changes.  Musculoskeletal: Negative  for gait problem or joint swelling.  Skin: Negative for rash.  Neurological: Negative for dizziness or headache.  No other specific complaints in a complete review of systems (except as listed in HPI above).  Objective  Vitals:   04/27/18 1113  BP: 120/70  Pulse: 75  Resp: 16  Temp: 97.8 F (36.6 C)  TempSrc: Oral  SpO2: 98%  Weight: 226 lb 1.6 oz (102.6 kg)  Height: 5\' 10"  (1.778 m)   Body mass index is 32.44 kg/m.  Nursing Note and Vital Signs reviewed.  Physical Exam  Constitutional: Patient appears well-developed and well-nourished. No distress.  HENT: Head: Normocephalic and atraumatic Nose: Nose normal.  Eyes: Conjunctivae and EOM are normal. No scleral icterus.  Pupils are equal, round, and reactive to light.  Neck: Normal range of motion. Neck supple.  Cardiovascular: Normal rate, regular rhythm and normal heart sounds.  No murmur heard. No BLE edema. Pulmonary/Chest: Effort normal and breath sounds normal. No respiratory distress. Abdominal: Soft. Bowel sounds are normal, no distension. There is tenderness to the right upper and lower quadrants - +guarding, negative for rebound tenderness. No masses. Musculoskeletal: Normal range of motion, no joint effusions. No gross deformities Neurological: Pt is alert and oriented to person, place, and time. No cranial nerve deficit. Coordination, balance, strength, speech and gait are normal.  Skin: Skin is warm and dry. No rash noted. No erythema.  Psychiatric: Patient has a normal mood and affect. behavior is normal. Judgment and thought content normal.  No results found for  this or any previous visit (from the past 72 hour(s)).  Assessment & Plan  1. Diarrhea of presumed infectious origin - Gastrointestinal Pathogen Panel PCR; Future - CBC w/Diff/Platelet; Future - Comprehensive Metabolic Panel (CMET); Future - Lipase, blood; Future - dicyclomine (BENTYL) 20 MG tablet; Take 1 tablet (20 mg total) by mouth every 6 (six)  hours.  Dispense: 30 tablet; Refill: 0 - ondansetron (ZOFRAN) 4 MG tablet; Take 1 tablet (4 mg total) by mouth every 8 (eight) hours as needed for nausea or vomiting.  Dispense: 30 tablet; Refill: 0 - US Abdomen Complete; Future  2. Right upper quadrant abdominal pain - Gastrointestinal Pathogen Panel PCR; Future - CBC w/Diff/Platelet; Future - Comprehensive Metabolic Panel (CMET); Future - Lipase, blood; Future - dicyclomine (BENTYL) 20 MG tablet; Take 1 tablet (20 mg total) by mouth every 6 (six) hours.  Dispense: 30 tablet; Refill: 0 - ondansetron (ZOFRAN) 4 MG tablet; Take 1 tablet (4 mg total) by mouth every 8 (eight) hours as needed for nausea or vomiting.  Dispense: 30 tablet; Refill: 0 - US Abdomen Complete; Future  3. Nausea - ondansetron (ZOFRAN) 4 MG tablet; Take 1 tablet (4 mg total) by mouth every 8 (eight) hours as needed for nausea or vomiting.  Dispense: 30 tablet; Refill: 0 - US Abdomen Complete; Future  4. Right lower quadrant abdominal pain - US Abdomen Complete; Future  - US to rule out appendicitis vs gall bladder pathology; labs to determine if infectious in nature. [facility] - Last PO - 9:00am 04/27/2018.    -Red flags and when to present for emergency care or RTC including fever >101.13F, chest pain, shortness of breath, new/worsening/un-resolving symptoms, uncontrolled vomiting reviewed with patient at time of visit. Follow up and care instructions discussed and provided in AVS.

## 2018-04-27 NOTE — Patient Instructions (Signed)
Please go directly to the [facility] for labs to be performed including stool sample.

## 2018-04-28 ENCOUNTER — Other Ambulatory Visit
Admission: RE | Admit: 2018-04-28 | Discharge: 2018-04-28 | Disposition: A | Discharge status: Home / Self Care | Payer: BC Managed Care – PPO | Source: Ambulatory Visit | Attending: Family Medicine | Admitting: Family Medicine

## 2018-04-28 ENCOUNTER — Other Ambulatory Visit: Payer: Self-pay | Admitting: Family Medicine

## 2018-04-28 DIAGNOSIS — R748 Abnormal levels of other serum enzymes: Secondary | ICD-10-CM

## 2018-04-28 DIAGNOSIS — R945 Abnormal results of liver function studies: Secondary | ICD-10-CM | POA: Insufficient documentation

## 2018-04-28 DIAGNOSIS — R1011 Right upper quadrant pain: Secondary | ICD-10-CM

## 2018-04-28 DIAGNOSIS — R197 Diarrhea, unspecified: Secondary | ICD-10-CM | POA: Insufficient documentation

## 2018-04-28 LAB — HEPATIC FUNCTION PANEL
ALBUMIN: 4.5 g/dL (ref 3.5–5.0)
ALT: 112 U/L — ABNORMAL HIGH (ref 0–44)
AST: 48 U/L — AB (ref 15–41)
Alkaline Phosphatase: 47 U/L (ref 38–126)
Bilirubin, Direct: <0.1 mg/dL (ref 0.0–0.2)
TOTAL PROTEIN: 7.8 g/dL (ref 6.5–8.1)
Total Bilirubin: 0.4 mg/dL (ref 0.3–1.2)

## 2018-04-28 NOTE — Progress Notes (Signed)
Called the lab at [facility] - spoke with Luanne, they will add on Acute Hepatitis Panel to labs.

## 2018-04-30 ENCOUNTER — Other Ambulatory Visit: Payer: Self-pay | Admitting: Emergency Medicine

## 2018-04-30 ENCOUNTER — Other Ambulatory Visit
Admission: RE | Admit: 2018-04-30 | Discharge: 2018-04-30 | Disposition: A | Discharge status: Home / Self Care | Payer: BC Managed Care – PPO | Source: Ambulatory Visit | Attending: Family Medicine | Admitting: Family Medicine

## 2018-04-30 ENCOUNTER — Telehealth: Payer: Self-pay | Admitting: Family Medicine

## 2018-04-30 ENCOUNTER — Ambulatory Visit: Payer: Self-pay

## 2018-04-30 DIAGNOSIS — R1011 Right upper quadrant pain: Secondary | ICD-10-CM | POA: Insufficient documentation

## 2018-04-30 DIAGNOSIS — R748 Abnormal levels of other serum enzymes: Secondary | ICD-10-CM | POA: Diagnosis not present

## 2018-04-30 DIAGNOSIS — A498 Other bacterial infections of unspecified site: Secondary | ICD-10-CM

## 2018-04-30 LAB — GASTROINTESTINAL PANEL BY PCR, STOOL (REPLACES STOOL CULTURE)
ASTROVIRUS: NOT DETECTED
Adenovirus F40/41: NOT DETECTED
CAMPYLOBACTER SPECIES: NOT DETECTED
CYCLOSPORA CAYETANENSIS: NOT DETECTED
Cryptosporidium: NOT DETECTED
ENTEROPATHOGENIC E COLI (EPEC): DETECTED — AB
ENTEROTOXIGENIC E COLI (ETEC): NOT DETECTED
Entamoeba histolytica: NOT DETECTED
Enteroaggregative E coli (EAEC): NOT DETECTED
Giardia lamblia: NOT DETECTED
NOROVIRUS GI/GII: NOT DETECTED
PLESIMONAS SHIGELLOIDES: NOT DETECTED
ROTAVIRUS A: NOT DETECTED
SAPOVIRUS (I, II, IV, AND V): DETECTED — AB
SHIGA LIKE TOXIN PRODUCING E COLI (STEC): NOT DETECTED
Salmonella species: NOT DETECTED
Shigella/Enteroinvasive E coli (EIEC): NOT DETECTED
VIBRIO SPECIES: NOT DETECTED
Vibrio cholerae: NOT DETECTED
Yersinia enterocolitica: NOT DETECTED

## 2018-04-30 NOTE — Telephone Encounter (Signed)
Returned call to [facility]; spoke with Darl PikesSusan; stated she called lab results to the Nurse Practitioner re: this pt. about 30 min. ago.  No further action taken at this time.      Message from CiceroJa-Kwan Mcneil sent at 04/30/2018 11:03 AM EST   Annice PihJackie with [facility] called to report results. Cb# [phone]

## 2018-04-30 NOTE — Telephone Encounter (Signed)
Chart states it is in process. Gregory Matthews - please call lab and find out when we can expect results.

## 2018-04-30 NOTE — Telephone Encounter (Signed)
Patient is calling back in ro see if stool sample was received

## 2018-04-30 NOTE — Telephone Encounter (Signed)
Called lad and added Hepatitis panel and faxed order

## 2018-04-30 NOTE — Telephone Encounter (Signed)
Pt called regarding lab results stating he received a call earlier today about zepo virus? He states he was asked about consulting a GI doctor and he has additional questions. Please call back at [phone].

## 2018-04-30 NOTE — Telephone Encounter (Signed)
Pt states he dropped his sample off at the hospital on Saturday and was wanting to know if his results have came back yet. Please advise.

## 2018-04-30 NOTE — Telephone Encounter (Signed)
Patient has appointment with GI tomorrow at 10am in [facility].  Please call and see if he has any additional questions.

## 2018-05-01 ENCOUNTER — Encounter: Payer: Self-pay | Admitting: *Deleted

## 2018-05-01 ENCOUNTER — Ambulatory Visit: Payer: BC Managed Care – PPO | Admitting: Gastroenterology

## 2018-05-01 ENCOUNTER — Telehealth: Payer: Self-pay | Admitting: Family Medicine

## 2018-05-01 LAB — HEPATITIS PANEL, ACUTE
HCV Ab: <0.1 s/co ratio (ref 0.0–0.9)
Hep A IgM: NEGATIVE
Hep B C IgM: NEGATIVE
Hepatitis B Surface Ag: NEGATIVE

## 2018-05-01 NOTE — Telephone Encounter (Signed)
Patient stated he is feeling better, more bowel movements and still nauseated but appetite is returning. Can you give him another round of antibiotics

## 2018-05-01 NOTE — Telephone Encounter (Signed)
He was treated with antibiotic previously, I cannot send in another round of antibiotics. He needs to see GI - he missed his appointment today.  I am concerned that he will continue to cycle through feeling better for a period of time, then developing symptoms again, which is why I referred him for further management.

## 2018-05-01 NOTE — Telephone Encounter (Signed)
He definitely needs to see GI - he had appointment scheduled for 10am today.  He has had recurrent E. Coli despite antibiotic treatment.  Copied from CRM [id number]. Topic: General - Other >> May 01, 2018  8:27 AM Percival Spanish wrote:  Pt call to say he wanted to update Irving Burton on how he has been doing since he was diagn with e coli he said he has been feeling better and not sure he need to see a GI doctor. He is asking if maybe another round of antibioc would help. Would like a call back

## 2018-05-02 NOTE — Telephone Encounter (Signed)
Patient notified. GI number was given to patient to call and reschedule appointment

## 2018-05-30 ENCOUNTER — Encounter: Payer: Self-pay | Admitting: Gastroenterology

## 2018-05-30 ENCOUNTER — Other Ambulatory Visit: Payer: Self-pay

## 2018-05-30 ENCOUNTER — Ambulatory Visit: Payer: BC Managed Care – PPO | Admitting: Gastroenterology

## 2018-05-30 VITALS — BP 119/71 | HR 65 | Resp 17 | Ht 70.0 in | Wt 228.6 lb

## 2018-05-30 DIAGNOSIS — R945 Abnormal results of liver function studies: Secondary | ICD-10-CM | POA: Diagnosis not present

## 2018-05-30 DIAGNOSIS — A044 Other intestinal Escherichia coli infections: Secondary | ICD-10-CM

## 2018-05-30 DIAGNOSIS — K760 Fatty (change of) liver, not elsewhere classified: Secondary | ICD-10-CM

## 2018-05-30 DIAGNOSIS — M62830 Muscle spasm of back: Secondary | ICD-10-CM | POA: Diagnosis not present

## 2018-05-30 DIAGNOSIS — R7989 Other specified abnormal findings of blood chemistry: Secondary | ICD-10-CM

## 2018-05-30 MED ORDER — CYCLOBENZAPRINE HCL 10 MG PO TABS: 10.0000 mg | ORAL_TABLET | Freq: Three times a day (TID) | ORAL | 0 refills | Status: DC | PRN

## 2018-05-30 NOTE — Progress Notes (Signed)
Arlyss Repressohini R Laural Eiland, MD [address]  [address]  [place], [place] [address]  Main: [phone]  Fax: [phone]    Gastroenterology Consultation  Referring Provider:     Alba CorySowles, Krichna, MD Primary Care Physician:  Alba CorySowles, Krichna, MD Primary Gastroenterologist:  Dr. Arlyss Repressohini R Urian Martenson Reason for Consultation:     Recurrent E Coli, elevated LFTs        HPI:   Gregory Matthews is a 38 y.o. Caucasian male referred by Dr. Alba CorySowles, Krichna, MD  for consultation & management of recurrent E Coli. He had one week of acute onset of nonbloody diarrhea, stated with abdominal cramps after he and his family returned from [place] in 02[place].  His father had E. coli when he was in his right.  His stool studies came back positive for entero-aggregative, enteropathogenic and enterotoxigenic E. coli, treated with azithromycin x 3days.  Symptoms resolved and had return of diarrhea during 1st week of January.  Repeat stool studies came back positive for Sapo virus and enteropathogenic E. coli.  Did not receive any antibiotics from his primary care provider, was recommended to see GI due to recurrent E. coli infection.  Patient reported that his diarrhea resolved in 1 week without any treatment.  He kept his appointment today as he has been experiencing pain in the right upper posterior lateral back pain since his second E. coli infection.  The pain is intermittent and he cannot lay on his right side when he sleeps.  He denies lifting any weights, muscle strain or injury.  He underwent CT abdomen and pelvis in 06/2017 which was unremarkable.  Ultrasound abdomen in January 2020 revealed fatty liver.  He denies rectal bleeding, abdominal pain, weight loss.  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  He is incidentally found to have elevated transaminases, ALT greater than AST in 04/2018.  His viral hepatitis panel is negative.  Patient acknowledges drinking sodas and consuming red meat  regularly.  He denies blood transfusion, IV drug abuse, using over-the-counter supplements, alcohol use  NSAIDs: None  Antiplts/Anticoagulants/Anti thrombotics: None  GI Procedures: None He denies family history of GI malignancy, inflammatory bowel disease  Past Medical History:  Diagnosis Date  . Acute pharyngitis   . Hay fever   . Migraine   . Sinusitis, acute     Past Surgical History:  Procedure Laterality Date  . RHINOPLASTY    . TONSILLECTOMY      Current Outpatient Medications:  .  cyclobenzaprine (FLEXERIL) 10 MG tablet, Take 1 tablet (10 mg total) by mouth 3 (three) times daily as needed for muscle spasms., Disp: 10 tablet, Rfl: 0   Family History  Problem Relation Age of Onset  . Cancer Mother        Breast  . Hypertension Father   . Heart disease Father   . Heart attack Father   . Cancer Maternal Aunt        Breast  . Hyperlipidemia Maternal Grandmother   . Parkinson's disease Paternal Grandfather      Social History   Tobacco Use  . Smoking status: Never Smoker  . Smokeless tobacco: Never Used  Substance Use Topics  . Alcohol use: No    Alcohol/week: 0.0 standard drinks  . Drug use: No    Allergies as of 05/30/2018 - Review Complete 05/30/2018  Allergen Reaction Noted  . Penicillins Rash 06/08/2015    Review of Systems:    All systems reviewed and  negative except where noted in HPI.   Physical Exam:  BP 119/71 (BP Location: Left Arm, Patient Position: Sitting, Cuff Size: Large)   Pulse 65   Resp 17   Ht 5\' 10"  (1.778 m)   Wt 228 lb 9.6 oz (103.7 kg)   BMI 32.80 kg/m  No LMP for male patient.  General:   Alert,  Well-developed, well-nourished, pleasant and cooperative in NAD Head:  Normocephalic and atraumatic. Eyes:  Sclera clear, no icterus.   Conjunctiva pink. Ears:  Normal auditory acuity. Nose:  No deformity, discharge, or lesions. Mouth:  No deformity or lesions,oropharynx pink & moist. Neck:  Supple; no masses or  thyromegaly. Lungs:  Respirations even and unlabored.  Clear throughout to auscultation.   No wheezes, crackles, or rhonchi. No acute distress. Heart:  Regular rate and rhythm; no murmurs, clicks, rubs, or gallops. Abdomen:  Normal bowel sounds. Soft, non-tender and non-distended without masses, hepatosplenomegaly or hernias noted.  No guarding or rebound tenderness.  He does have mild right upper flank tenderness in right lateral aspect of the low back Rectal: Not performed Msk:  Symmetrical without gross deformities. Good, equal movement & strength bilaterally. Pulses:  Normal pulses noted. Extremities:  No clubbing or edema.  No cyanosis. Neurologic:  Alert and oriented x3;  grossly normal neurologically. Skin:  Intact without significant lesions or rashes. No jaundice. Psych:  Alert and cooperative. Normal mood and affect.  Imaging Studies: Reviewed  Assessment and Plan:   Gregory Matthews is a 38 y.o. Caucasian male with BMI 32, recurrent E. coli traveler's diarrhea after returning from [place] in 02/2018 which has currently resolved.  Also found to have elevated transaminases, ultrasound revealed fatty liver  Recurrent E. coli traveler's diarrhea This is self-limited in immunocompetent individuals and does not require any treatment It has currently resolved If diarrhea recurs, recommend stool studies and possibly a colonoscopy  Right upper flank pain Does not appear to be related to GI pathology Trial of Flexeril  Elevated transaminases Most likely secondary to fatty liver Viral hepatitis panel negative Recommend to stop carbonated beverages, consumption of red meat Discussed about healthy lifestyle Recheck LFTs in 3 months, if persistently elevated, will evaluate for other causes of liver disease   Follow up in 3 months   Arlyss Repress, MD

## 2018-06-15 ENCOUNTER — Encounter: Payer: Self-pay | Admitting: Family Medicine

## 2018-06-15 ENCOUNTER — Ambulatory Visit: Payer: BC Managed Care – PPO | Admitting: Family Medicine

## 2018-06-15 VITALS — BP 122/72 | HR 77 | Temp 98.0°F | Resp 18 | Ht 66.0 in | Wt 227.1 lb

## 2018-06-15 DIAGNOSIS — F332 Major depressive disorder, recurrent severe without psychotic features: Secondary | ICD-10-CM

## 2018-06-15 DIAGNOSIS — Z23 Encounter for immunization: Secondary | ICD-10-CM

## 2018-06-15 MED ORDER — VORTIOXETINE HBR 5 MG PO TABS: 5.0000 mg | ORAL_TABLET | Freq: Every day | ORAL | 0 refills | Status: DC

## 2018-06-15 NOTE — Progress Notes (Signed)
Name: Gregory Matthews   MRN: [id number]    DOB: Aug 31, 1980   Date:06/15/2018       Progress Note  Subjective  Chief Complaint  Chief Complaint  Patient presents with  . Anxiety  . Depression    HPI  MDD: patient states he has a history of depression, but never this severe or this long. He states that he found out a couple of months ago that his wife was texting another man. She denies any sexual contact, but admitted on kissing him. He has been feeling los since. Having crying spells, difficulty concentrating, irritability. Initially had problems sleeping but that has improved now. He also had loss of appetite but is improving now. He states he has a good family support (his father and mother in law know about it) , he also has a couple of friends that are aware of the problem. They are having marital counseling. He states during recent visit to counselor he stated he was having suicidal thoughts and was advised to come in to seek further help. He denies any suicidal planning but has ideation.   Patient Active Problem List   Diagnosis Date Noted  . Headache, migraine 06/08/2015  . Obesity (BMI 30.0-34.9) 06/08/2015  . Allergic rhinitis, seasonal 07/26/2013    Past Surgical History:  Procedure Laterality Date  . RHINOPLASTY    . TONSILLECTOMY      Family History  Problem Relation Age of Onset  . Cancer Mother        Breast  . Hypertension Father   . Heart disease Father   . Heart attack Father   . Cancer Maternal Aunt        Breast  . Hyperlipidemia Maternal Grandmother   . Parkinson's disease Paternal Grandfather     Social History   Socioeconomic History  . Marital status: Married    Spouse name: Morrie Sheldon   . Number of children: 0  . Years of education: Not on file  . Highest education level: Bachelor's degree (e.g., BA, AB, BS)  Occupational History  . Occupation: Runner, broadcasting/film/video   Social Needs  . Financial resource strain: Somewhat hard  . Food insecurity:    Worry:  Never true    Inability: Never true  . Transportation needs:    Medical: No    Non-medical: No  Tobacco Use  . Smoking status: Never Smoker  . Smokeless tobacco: Never Used  Substance and Sexual Activity  . Alcohol use: No    Alcohol/week: 0.0 standard drinks  . Drug use: No  . Sexual activity: Yes    Partners: Female  Lifestyle  . Physical activity:    Days per week: 0 days    Minutes per session: 0 min  . Stress: Only a little  Relationships  . Social connections:    Talks on phone: More than three times a week    Gets together: Twice a week    Attends religious service: More than 4 times per year    Active member of club or organization: Yes    Attends meetings of clubs or organizations: More than 4 times per year    Relationship status: Married  . Intimate partner violence:    Fear of current or ex partner: No    Emotionally abused: No    Physically abused: No    Forced sexual activity: No  Other Topics Concern  . Not on file  Social History Narrative  . Not on file     Current  Outpatient Medications:  .  cyclobenzaprine (FLEXERIL) 10 MG tablet, Take 1 tablet (10 mg total) by mouth 3 (three) times daily as needed for muscle spasms., Disp: 10 tablet, Rfl: 0 .  vortioxetine HBr (TRINTELLIX) 5 MG TABS tablet, Take 1-2 tablets (5-10 mg total) by mouth daily. First week take one after that 2 daily until follow up, Disp: 60 tablet, Rfl: 0  Allergies  Allergen Reactions  . Penicillins Rash    I personally reviewed active problem list, medication list, allergies, family history, social history with the patient/caregiver today.   ROS  Constitutional: Negative for fever or weight change.  Respiratory: Negative for cough and shortness of breath.   Cardiovascular: Negative for chest pain or palpitations.  Gastrointestinal: Negative for abdominal pain, no bowel changes.  Musculoskeletal: Negative for gait problem or joint swelling.  Skin: Negative for rash.   Neurological: Negative for dizziness or headache.  No other specific complaints in a complete review of systems (except as listed in HPI above).  Objective  Vitals:   06/15/18 1438  BP: 122/72  Pulse: 77  Resp: 18  Temp: 98 F (36.7 C)  TempSrc: Oral  SpO2: 98%  Weight: 227 lb 1.6 oz (103 kg)  Height: 5\' 6"  (1.676 m)    Body mass index is 36.65 kg/m.  Physical Exam  Constitutional: Patient appears well-developed and well-nourished. Obese  No distress.  HEENT: head atraumatic, normocephalic, pupils equal and reactive to light,  neck supple, throat within normal limits Cardiovascular: Normal rate, regular rhythm and normal heart sounds.  No murmur heard. No BLE edema. Pulmonary/Chest: Effort normal and breath sounds normal. No respiratory distress. Abdominal: Soft.  There is no tenderness. Psychiatric: Patient is sad, cried during the visit.   Recent Results (from the past 2160 hour(s))  Lipase, blood     Status: None   Collection Time: 04/27/18 12:59 PM  Result Value Ref Range   Lipase 24 11 - 51 U/L    Comment: Performed at [facility], [address] [place], [place] [address]  Comprehensive Metabolic Panel (CMET)     Status: Abnormal   Collection Time: 04/27/18 12:59 PM  Result Value Ref Range   Sodium 137 135 - 145 mmol/L   Potassium 3.5 3.5 - 5.1 mmol/L   Chloride 107 98 - 111 mmol/L   CO2 24 22 - 32 mmol/L   Glucose, Bld 94 70 - 99 mg/dL   BUN 14 6 - 20 mg/dL   Creatinine, Ser 5.63 0.61 - 1.24 mg/dL   Calcium 8.9 8.9 - 89.3 mg/dL   Total Protein 7.9 6.5 - 8.1 g/dL   Albumin 4.5 3.5 - 5.0 g/dL   AST 48 (H) 15 - 41 U/L   ALT 113 (H) 0 - 44 U/L   Alkaline Phosphatase 52 38 - 126 U/L   Total Bilirubin 0.5 0.3 - 1.2 mg/dL   GFR calc non Af Amer >60 >60 mL/min   GFR calc Af Amer >60 >60 mL/min   Anion gap 6 5 - 15    Comment: Performed at [facility], [address] [place], [place] [address]  CBC w/Diff/Platelet     Status: None    Collection Time: 04/27/18 12:59 PM  Result Value Ref Range   WBC 7.0 4.0 - 10.5 K/uL   RBC 5.28 4.22 - 5.81 MIL/uL   Hemoglobin 15.6 13.0 - 17.0 g/dL   HCT 76.8 11.5 - 72.6 %   MCV 85.6 80.0 - 100.0 fL  MCH 29.5 26.0 - 34.0 pg   MCHC 34.5 30.0 - 36.0 g/dL   RDW 83.7 29.0 - 21.1 %   Platelets 322 150 - 400 K/uL   nRBC 0.0 0.0 - 0.2 %   Neutrophils Relative % 62 %   Neutro Abs 4.3 1.7 - 7.7 K/uL   Lymphocytes Relative 27 %   Lymphs Abs 1.9 0.7 - 4.0 K/uL   Monocytes Relative 9 %   Monocytes Absolute 0.7 0.1 - 1.0 K/uL   Eosinophils Relative 2 %   Eosinophils Absolute 0.1 0.0 - 0.5 K/uL   Basophils Relative 0 %   Basophils Absolute 0.0 0.0 - 0.1 K/uL   Immature Granulocytes 0 %   Abs Immature Granulocytes 0.01 0.00 - 0.07 K/uL    Comment: Performed at [facility], [address] [place], [place] [address]  Hepatic function panel     Status: Abnormal   Collection Time: 04/27/18 12:59 PM  Result Value Ref Range   Total Protein 7.8 6.5 - 8.1 g/dL   Albumin 4.5 3.5 - 5.0 g/dL   AST 48 (H) 15 - 41 U/L   ALT 112 (H) 0 - 44 U/L   Alkaline Phosphatase 47 38 - 126 U/L   Total Bilirubin 0.4 0.3 - 1.2 mg/dL   Bilirubin, Direct <8.0 0.0 - 0.2 mg/dL   Indirect Bilirubin NOT CALCULATED 0.3 - 0.9 mg/dL    Comment: Performed at [facility], [address] [place], [place] [address]  Hepatitis panel, acute     Status: None   Collection Time: 04/27/18 12:59 PM  Result Value Ref Range   Hepatitis B Surface Ag Negative Negative   HCV Ab <0.1 0.0 - 0.9 s/co ratio    Comment: (NOTE)                                  Negative:     < 0.8                             Indeterminate: 0.8 - 0.9                                  Positive:     > 0.9 The CDC recommends that a positive HCV antibody result be followed up with a HCV Nucleic Acid Amplification test (122449). Performed At: [facility] [address] [place], [place] [id number] Jolene Schimke MD  YT:[id number]    Hep A IgM Negative Negative   Hep B C IgM Negative Negative  Gastrointestinal Panel by PCR , Stool     Status: Abnormal   Collection Time: 04/27/18  6:52 PM  Result Value Ref Range   Campylobacter species NOT DETECTED NOT DETECTED   Plesimonas shigelloides NOT DETECTED NOT DETECTED   Salmonella species NOT DETECTED NOT DETECTED   Yersinia enterocolitica NOT DETECTED NOT DETECTED   Vibrio species NOT DETECTED NOT DETECTED   Vibrio cholerae NOT DETECTED NOT DETECTED   Enteroaggregative E coli (EAEC) NOT DETECTED NOT DETECTED   Enteropathogenic E coli (EPEC) DETECTED (A) NOT DETECTED    Comment: RESULT CALLED TO, READ BACK BY AND VERIFIED WITH:  EMILY BOYCE FNP AT 1226 04/30/18 SDR    Enterotoxigenic E coli (ETEC) NOT DETECTED NOT DETECTED   Shiga like toxin producing E coli (STEC)  NOT DETECTED NOT DETECTED   Shigella/Enteroinvasive E coli (EIEC) NOT DETECTED NOT DETECTED   Cryptosporidium NOT DETECTED NOT DETECTED   Cyclospora cayetanensis NOT DETECTED NOT DETECTED   Entamoeba histolytica NOT DETECTED NOT DETECTED   Giardia lamblia NOT DETECTED NOT DETECTED   Adenovirus F40/41 NOT DETECTED NOT DETECTED   Astrovirus NOT DETECTED NOT DETECTED   Norovirus GI/GII NOT DETECTED NOT DETECTED   Rotavirus A NOT DETECTED NOT DETECTED   Sapovirus (I, II, IV, and V) DETECTED (A) NOT DETECTED    Comment: Performed at [facility], [address] [place], [place] [address]     PHQ2/9: Depression screen [facility] 2/[facility] 06/15/2018 04/24/2017 03/23/2017 11/18/2016 10/09/2015  Decreased Interest 2 0 0 0 0  Down, Depressed, Hopeless 3 0 0 0 0  PHQ - 2 Score 5 0 0 0 0  Altered sleeping 2 - - - -  Tired, decreased energy 3 - - - -  Change in appetite 0 - - - -  Feeling bad or failure about yourself  3 - - - -  Trouble concentrating 3 - - - -  Moving slowly or fidgety/restless 3 - - - -  Suicidal thoughts 1 - - - -  PHQ-9 Score 20 - - - -  Difficult doing work/chores Somewhat  difficult - - - -    GAD 7 : Generalized Anxiety Score 06/15/2018  Nervous, Anxious, on Edge 3  Control/stop worrying 3  Worry too much - different things 3  Trouble relaxing 2  Restless 0  Easily annoyed or irritable 2  Afraid - awful might happen 1  Total GAD 7 Score 14  Anxiety Difficulty Somewhat difficult    Fall Risk: Fall Risk  04/27/2018 03/12/2018 01/19/2018 10/23/2017 10/23/2017  Falls in the past year? 0 0 No No No  Number falls in past yr: 0 - - - -  Injury with Fall? 0 - - - -    Functional Status Survey: Is the patient deaf or have difficulty hearing?: No Does the patient have difficulty seeing, even when wearing glasses/contacts?: No Does the patient have difficulty concentrating, remembering, or making decisions?: No Does the patient have difficulty walking or climbing stairs?: No Does the patient have difficulty dressing or bathing?: No Does the patient have difficulty doing errands alone such as visiting a doctor's office or shopping?: No    Assessment & Plan  1. Severe episode of recurrent major depressive disorder, without psychotic features (HCC)  Advised to continue marital counseling but to also seek individual counseling. Discussed medications and he is worried about sexual side effects and weight gain. We will try Trintelllix  - vortioxetine HBr (TRINTELLIX) 5 MG TABS tablet; Take 1-2 tablets (5-10 mg total) by mouth daily. First week take one after that 2 daily until follow up  Dispense: 60 tablet; Refill: 0  2. Need for immunization against influenza  - Flu Vaccine QUAD 6+ mos PF IM (Fluarix Quad PF)

## 2018-06-25 ENCOUNTER — Telehealth: Payer: Self-pay | Admitting: Family Medicine

## 2018-06-25 DIAGNOSIS — F332 Major depressive disorder, recurrent severe without psychotic features: Secondary | ICD-10-CM

## 2018-06-25 MED ORDER — VORTIOXETINE HBR 5 MG PO TABS: 5.0000 mg | ORAL_TABLET | Freq: Every day | ORAL | 0 refills | Status: DC

## 2018-06-25 NOTE — Telephone Encounter (Signed)
I sent in 15 tablets, please have him come in tomorrow for follow up. Thanks!

## 2018-06-25 NOTE — Telephone Encounter (Signed)
Pt.notified

## 2018-06-25 NOTE — Telephone Encounter (Signed)
Pt was given a new medication and has a FU tomorrow for Dr Carlynn Purl. Pt took his last Trintellix 5 mg today and needs one for tomorrow before he comes in to see Dr Carlynn Purl. I wasn't sure if this is something that you can do for pt. Pt uses [facility].

## 2018-06-26 ENCOUNTER — Encounter: Payer: Self-pay | Admitting: Family Medicine

## 2018-06-26 ENCOUNTER — Ambulatory Visit: Payer: BC Managed Care – PPO | Admitting: Family Medicine

## 2018-06-26 VITALS — BP 120/60 | HR 70 | Temp 98.2°F | Resp 16 | Ht 66.0 in | Wt 226.9 lb

## 2018-06-26 DIAGNOSIS — F332 Major depressive disorder, recurrent severe without psychotic features: Secondary | ICD-10-CM

## 2018-06-26 MED ORDER — VORTIOXETINE HBR 10 MG PO TABS: 10.0000 mg | ORAL_TABLET | Freq: Every day | ORAL | 0 refills | Status: DC

## 2018-06-26 NOTE — Progress Notes (Signed)
Name: Gregory Matthews   MRN: [id number]    DOB: 1980-05-09   Date:06/27/2018       Progress Note  Subjective  Chief Complaint  Chief Complaint  Patient presents with  . Depression    HPI   MDD: patient states he has a history of depression, but never this severe or this long. He states that he found out a couple of months ago that his wife was texting another man. She denies any sexual contact, but admitted on kissing him. He has been feeling los since. Having crying spells, difficulty concentrating, irritability. Initially had problems sleeping but that has improved now. He also had loss of appetite but is improving now. He states he has a good family support (his father and mother in law know about it) , he also has a couple of friends that are aware of the problem. They are having marital counseling. Since last visit wife's niece that is 66 years old moved in with them. It is causing a little more stress and lack of intimacy with his wife. He is still having crying spells, had to leave work early today because unable to focus. Feeling tired and still feels like moving slower than usual. Phq 9 slightly better. He brought FMLA papers today.   Patient Active Problem List   Diagnosis Date Noted  . Headache, migraine 06/08/2015  . Obesity (BMI 30.0-34.9) 06/08/2015  . Allergic rhinitis, seasonal 07/26/2013    Past Surgical History:  Procedure Laterality Date  . RHINOPLASTY    . TONSILLECTOMY      Family History  Problem Relation Age of Onset  . Cancer Mother        Breast  . Hypertension Father   . Heart disease Father   . Heart attack Father   . Cancer Maternal Aunt        Breast  . Hyperlipidemia Maternal Grandmother   . Parkinson's disease Paternal Grandfather     Social History   Socioeconomic History  . Marital status: Married    Spouse name: Morrie Sheldon   . Number of children: 0  . Years of education: Not on file  . Highest education level: Bachelor's degree (e.g., BA,  AB, BS)  Occupational History  . Occupation: Runner, broadcasting/film/video   Social Needs  . Financial resource strain: Somewhat hard  . Food insecurity:    Worry: Never true    Inability: Never true  . Transportation needs:    Medical: No    Non-medical: No  Tobacco Use  . Smoking status: Never Smoker  . Smokeless tobacco: Never Used  Substance and Sexual Activity  . Alcohol use: No    Alcohol/week: 0.0 standard drinks  . Drug use: No  . Sexual activity: Yes    Partners: Female  Lifestyle  . Physical activity:    Days per week: 0 days    Minutes per session: 0 min  . Stress: Only a little  Relationships  . Social connections:    Talks on phone: More than three times a week    Gets together: Twice a week    Attends religious service: More than 4 times per year    Active member of club or organization: Yes    Attends meetings of clubs or organizations: More than 4 times per year    Relationship status: Married  . Intimate partner violence:    Fear of current or ex partner: No    Emotionally abused: No    Physically abused: No  Forced sexual activity: No  Other Topics Concern  . Not on file  Social History Narrative  . Not on file     Current Outpatient Medications:  .  cyclobenzaprine (FLEXERIL) 10 MG tablet, Take 1 tablet (10 mg total) by mouth 3 (three) times daily as needed for muscle spasms., Disp: 10 tablet, Rfl: 0 .  vortioxetine HBr (TRINTELLIX) 10 MG TABS tablet, Take 1 tablet (10 mg total) by mouth daily. First week take one after that 2 daily until follow up, Disp: 30 tablet, Rfl: 0  Allergies  Allergen Reactions  . Penicillins Rash    I personally reviewed active problem list, medication list, allergies, family history, social history with the patient/caregiver today.   ROS  Constitutional: Negative for fever or weight change.  Respiratory: Negative for cough and shortness of breath.   Cardiovascular: Negative for chest pain or palpitations.  Gastrointestinal:  Negative for abdominal pain, no bowel changes.  Musculoskeletal: Negative for gait problem or joint swelling.  Skin: Negative for rash.  Neurological: Negative for dizziness or headache.  No other specific complaints in a complete review of systems (except as listed in HPI above).  Objective  Vitals:   06/26/18 1433  BP: 120/60  Pulse: 70  Resp: 16  Temp: 98.2 F (36.8 C)  TempSrc: Oral  SpO2: 97%  Weight: 226 lb 14.4 oz (102.9 kg)  Height: 5\' 6"  (1.676 m)    Body mass index is 36.62 kg/m.  Physical Exam  Constitutional: Patient appears well-developed and well-nourished. Obese  No distress.  HEENT: head atraumatic, normocephalic, pupils equal and reactive to light,  neck supple, throat within normal limits Cardiovascular: Normal rate, regular rhythm and normal heart sounds.  No murmur heard. No BLE edema. Pulmonary/Chest: Effort normal and breath sounds normal. No respiratory distress. Abdominal: Soft.  There is no tenderness. Psychiatric: Patient is still depressed.  Judgment and thought content normal.  Recent Results (from the past 2160 hour(s))  Lipase, blood     Status: None   Collection Time: 04/27/18 12:59 PM  Result Value Ref Range   Lipase 24 11 - 51 U/L    Comment: Performed at [facility], [address] [place], [place] [address]  Comprehensive Metabolic Panel (CMET)     Status: Abnormal   Collection Time: 04/27/18 12:59 PM  Result Value Ref Range   Sodium 137 135 - 145 mmol/L   Potassium 3.5 3.5 - 5.1 mmol/L   Chloride 107 98 - 111 mmol/L   CO2 24 22 - 32 mmol/L   Glucose, Bld 94 70 - 99 mg/dL   BUN 14 6 - 20 mg/dL   Creatinine, Ser 6.04 0.61 - 1.24 mg/dL   Calcium 8.9 8.9 - 54.0 mg/dL   Total Protein 7.9 6.5 - 8.1 g/dL   Albumin 4.5 3.5 - 5.0 g/dL   AST 48 (H) 15 - 41 U/L   ALT 113 (H) 0 - 44 U/L   Alkaline Phosphatase 52 38 - 126 U/L   Total Bilirubin 0.5 0.3 - 1.2 mg/dL   GFR calc non Af Amer >60 >60 mL/min   GFR calc Af Amer >60  >60 mL/min   Anion gap 6 5 - 15    Comment: Performed at [facility], [address] [place], [place] [address]  CBC w/Diff/Platelet     Status: None   Collection Time: 04/27/18 12:59 PM  Result Value Ref Range   WBC 7.0 4.0 - 10.5 K/uL   RBC 5.28 4.22 -  5.81 MIL/uL   Hemoglobin 15.6 13.0 - 17.0 g/dL   HCT 72.0 91.9 - 80.2 %   MCV 85.6 80.0 - 100.0 fL   MCH 29.5 26.0 - 34.0 pg   MCHC 34.5 30.0 - 36.0 g/dL   RDW 21.7 98.1 - 02.5 %   Platelets 322 150 - 400 K/uL   nRBC 0.0 0.0 - 0.2 %   Neutrophils Relative % 62 %   Neutro Abs 4.3 1.7 - 7.7 K/uL   Lymphocytes Relative 27 %   Lymphs Abs 1.9 0.7 - 4.0 K/uL   Monocytes Relative 9 %   Monocytes Absolute 0.7 0.1 - 1.0 K/uL   Eosinophils Relative 2 %   Eosinophils Absolute 0.1 0.0 - 0.5 K/uL   Basophils Relative 0 %   Basophils Absolute 0.0 0.0 - 0.1 K/uL   Immature Granulocytes 0 %   Abs Immature Granulocytes 0.01 0.00 - 0.07 K/uL    Comment: Performed at [facility], [address] [place], [place] [address]  Hepatic function panel     Status: Abnormal   Collection Time: 04/27/18 12:59 PM  Result Value Ref Range   Total Protein 7.8 6.5 - 8.1 g/dL   Albumin 4.5 3.5 - 5.0 g/dL   AST 48 (H) 15 - 41 U/L   ALT 112 (H) 0 - 44 U/L   Alkaline Phosphatase 47 38 - 126 U/L   Total Bilirubin 0.4 0.3 - 1.2 mg/dL   Bilirubin, Direct <2.4 0.0 - 0.2 mg/dL   Indirect Bilirubin NOT CALCULATED 0.3 - 0.9 mg/dL    Comment: Performed at [facility], [address] [place], [place] [address]  Hepatitis panel, acute     Status: None   Collection Time: 04/27/18 12:59 PM  Result Value Ref Range   Hepatitis B Surface Ag Negative Negative   HCV Ab <0.1 0.0 - 0.9 s/co ratio    Comment: (NOTE)                                  Negative:     < 0.8                             Indeterminate: 0.8 - 0.9                                  Positive:     > 0.9 The CDC recommends that a positive HCV antibody result be followed  up with a HCV Nucleic Acid Amplification test (104045). Performed At: [facility] [address] [place], [place] [id number] Jolene Schimke MD FC:[id number]    Hep A IgM Negative Negative   Hep B C IgM Negative Negative  Gastrointestinal Panel by PCR , Stool     Status: Abnormal   Collection Time: 04/27/18  6:52 PM  Result Value Ref Range   Campylobacter species NOT DETECTED NOT DETECTED   Plesimonas shigelloides NOT DETECTED NOT DETECTED   Salmonella species NOT DETECTED NOT DETECTED   Yersinia enterocolitica NOT DETECTED NOT DETECTED   Vibrio species NOT DETECTED NOT DETECTED   Vibrio cholerae NOT DETECTED NOT DETECTED   Enteroaggregative E coli (EAEC) NOT DETECTED NOT DETECTED   Enteropathogenic E coli (EPEC) DETECTED (A) NOT DETECTED    Comment: RESULT CALLED TO, READ BACK BY AND VERIFIED WITH:  EMILY BOYCE FNP AT 1226 04/30/18 SDR    Enterotoxigenic E coli (ETEC) NOT DETECTED NOT DETECTED   Shiga like toxin producing E coli (STEC) NOT DETECTED NOT DETECTED   Shigella/Enteroinvasive E coli (EIEC) NOT DETECTED NOT DETECTED   Cryptosporidium NOT DETECTED NOT DETECTED   Cyclospora cayetanensis NOT DETECTED NOT DETECTED   Entamoeba histolytica NOT DETECTED NOT DETECTED   Giardia lamblia NOT DETECTED NOT DETECTED   Adenovirus F40/41 NOT DETECTED NOT DETECTED   Astrovirus NOT DETECTED NOT DETECTED   Norovirus GI/GII NOT DETECTED NOT DETECTED   Rotavirus A NOT DETECTED NOT DETECTED   Sapovirus (I, II, IV, and V) DETECTED (A) NOT DETECTED    Comment: Performed at [facility], [address] [place], [place] [address]     PHQ2/9: Depression screen [facility] 2/9 06/26/2018 06/15/2018 04/24/2017 03/23/2017 11/18/2016  Decreased Interest 3 2 0 0 0  Down, Depressed, Hopeless 3 3 0 0 0  PHQ - 2 Score 6 5 0 0 0  Altered sleeping 1 2 - - -  Tired, decreased energy 2 3 - - -  Change in appetite 0 0 - - -  Feeling bad or failure about yourself  2 3 - - -  Trouble  concentrating 1 3 - - -  Moving slowly or fidgety/restless 3 3 - - -  Suicidal thoughts 1 1 - - -  PHQ-9 Score 16 20 - - -  Difficult doing work/chores Very difficult Somewhat difficult - - -     Fall Risk: Fall Risk  06/26/2018 04/27/2018 03/12/2018 01/19/2018 10/23/2017  Falls in the past year? 0 0 0 No No  Number falls in past yr: 0 0 - - -  Injury with Fall? 0 0 - - -     Assessment & Plan  1. Severe episode of recurrent major depressive disorder, without psychotic features (HCC)  FMLA papers filled out today, he is still having problems concentration, crying spells.5 times per month from 4 hours to 2 days per episode lasting the next 6 months   - vortioxetine HBr (TRINTELLIX) 10 MG TABS tablet; Take 1 tablet (10 mg total) by mouth daily. First week take one after that 2 daily until follow up  Dispense: 30 tablet; Refill: 0

## 2018-06-27 ENCOUNTER — Encounter: Payer: Self-pay | Admitting: Family Medicine

## 2018-07-04 ENCOUNTER — Other Ambulatory Visit: Payer: Self-pay | Admitting: Family Medicine

## 2018-07-04 DIAGNOSIS — F332 Major depressive disorder, recurrent severe without psychotic features: Secondary | ICD-10-CM

## 2018-07-04 MED ORDER — VORTIOXETINE HBR 20 MG PO TABS: 20.0000 mg | ORAL_TABLET | Freq: Every day | ORAL | 0 refills | Status: DC

## 2018-07-04 NOTE — Telephone Encounter (Signed)
Refill request for general medication. Trintellix  To [facility]. Patient thinks bottle accidentally got thrown away by their house cleaner.  Last office visit 06/26/2018   Follow up on 07/17/2018

## 2018-07-17 ENCOUNTER — Encounter: Payer: Self-pay | Admitting: Family Medicine

## 2018-07-17 ENCOUNTER — Ambulatory Visit: Payer: BC Managed Care – PPO | Admitting: Family Medicine

## 2018-07-17 ENCOUNTER — Other Ambulatory Visit: Payer: Self-pay

## 2018-07-17 VITALS — BP 114/68 | HR 70 | Temp 97.5°F | Resp 16 | Ht 66.0 in | Wt 227.1 lb

## 2018-07-17 DIAGNOSIS — G43009 Migraine without aura, not intractable, without status migrainosus: Secondary | ICD-10-CM

## 2018-07-17 DIAGNOSIS — R748 Abnormal levels of other serum enzymes: Secondary | ICD-10-CM | POA: Diagnosis not present

## 2018-07-17 DIAGNOSIS — F332 Major depressive disorder, recurrent severe without psychotic features: Secondary | ICD-10-CM | POA: Diagnosis not present

## 2018-07-17 DIAGNOSIS — Z131 Encounter for screening for diabetes mellitus: Secondary | ICD-10-CM | POA: Diagnosis not present

## 2018-07-17 DIAGNOSIS — Z79899 Other long term (current) drug therapy: Secondary | ICD-10-CM

## 2018-07-17 DIAGNOSIS — Z13220 Encounter for screening for lipoid disorders: Secondary | ICD-10-CM | POA: Diagnosis not present

## 2018-07-17 NOTE — Progress Notes (Signed)
Name: Gregory Matthews   MRN: [id number]    DOB: [birth year]   Date:07/17/2018       Progress Note  Subjective  Chief Complaint  Chief Complaint  Patient presents with  . Depression    HPI  Depression Major: he is feeling much better, phq 9 has improved, he is taking medication Trintelix, he had lost the 10 mg bottles but found it again , so he will try going up on 20 mg and if he cannot tolerate he will call back for a refill of 10 mg ( advised to call pharmacist to find out if 20 mg can be cut in half). 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   Elevated liver enzymes: we will recheck today. No nausea or vomiting, diarrhea has resolved  Migraine: he states he has a long history of migraine headaches, and takes Excedrin migraine prn. His episodes are about 1-2 per month still responds to Excedrin migraine if he takes it right away episode can last few hours, but if he does not have time to take medication immediately it can over one day. We have tried Topamax and Imitrex. He has stopped both medication secondary to side effects. Topamax he stopped because of change in taste and Imitrex, because flushing sensation and chest tightness. Episodes of migraine are described as a dull headache, followed by nausea and the pain intensifies and becomes throbbing or  pounding sensation in different parts of his head. It is associated with photophobia, phonophobia and movement - such as walking. He stopped taking amitriptyline since episodes decreased in frequency    Patient Active Problem List   Diagnosis Date Noted  . Severe episode of recurrent major depressive disorder, without psychotic features (HCC) 07/17/2018  . Headache, migraine 06/08/2015  . Obesity (BMI 30.0-34.9) 06/08/2015  . Allergic rhinitis, seasonal 07/26/2013    Past Surgical History:   Procedure Laterality Date  . RHINOPLASTY    . TONSILLECTOMY      Family History  Problem Relation Age of Onset  . Cancer Mother        Breast  . Hypertension Father   . Heart disease Father   . Heart attack Father   . Cancer Maternal Aunt        Breast  . Hyperlipidemia Maternal Grandmother   . Parkinson's disease Paternal Grandfather     Social History   Socioeconomic History  . Marital status: Married    Spouse name: Morrie Sheldon   . Number of children: 0  . Years of education: Not on file  . Highest education level: Bachelor's degree (e.g., BA, AB, BS)  Occupational History  . Occupation: Runner, broadcasting/film/video   Social Needs  . Financial resource strain: Somewhat hard  . Food insecurity:    Worry: Never true    Inability: Never true  . Transportation needs:    Medical: No    Non-medical: No  Tobacco Use  . Smoking status: Never Smoker  . Smokeless tobacco: Never Used  Substance and Sexual Activity  . Alcohol use: No    Alcohol/week: 0.0 standard drinks  . Drug use: No  . Sexual activity: Yes    Partners: Female  Lifestyle  . Physical activity:    Days per week: 7 days    Minutes per session: 30 min  . Stress: Only a little  Relationships  . Social connections:    Talks on phone: More than three times a week    Gets together: Twice a week    Attends religious service: More than 4 times per year    Active member of club or organization: Yes    Attends meetings of clubs or organizations: More than 4 times per year    Relationship status: Married  . Intimate partner violence:    Fear of current or ex partner: No    Emotionally abused: No    Physically abused: No    Forced sexual activity: No  Other Topics Concern  . Not on file  Social History Narrative  . Not on file     Current Outpatient Medications:  .  vortioxetine HBr (TRINTELLIX) 20 MG TABS tablet, Take 1 tablet (20 mg total) by mouth daily., Disp: 30 tablet, Rfl: 0  Allergies  Allergen Reactions  .  Penicillins Rash    I personally reviewed active problem list, medication list, allergies, family history, social history with the patient/caregiver today.   ROS  Constitutional: Negative for fever or weight change.  Respiratory: Negative for cough and shortness of breath.   Cardiovascular: Negative for chest pain or palpitations.  Gastrointestinal: Negative for abdominal pain, no bowel changes.  Musculoskeletal: Negative for gait problem or joint swelling.  Skin: Negative for rash.  Neurological: Negative for dizziness or headache.  No other specific complaints in a complete review of systems (except as listed in HPI above).  Objective  Vitals:   07/17/18 0845  BP: 114/68  Pulse: 70  Resp: 16  Temp: (!) 97.5 F (36.4 C)  TempSrc: Oral  SpO2: 96%  Weight: 227 lb 1.6 oz (103 kg)  Height:  (1.676 m)    Body mass index is 36.65 kg/m.  Physical Exam  Constitutional: Patient appears well-developed and well-nourished. Obese  No distress.  HEENT: head atraumatic, normocephalic, pupils equal and reactive to light,neck supple, throat within normal limits Cardiovascular: Normal rate, regular rhythm and normal heart sounds.  No murmur heard. No BLE edema. Pulmonary/Chest: Effort normal and breath sounds normal. No respiratory distress. Abdominal: Soft.  There is no tenderness. Psychiatric: Patient has a normal mood and affect. behavior is normal. Judgment and thought content normal.  Recent Results (from the past 2160 hour(s))  Lipase, blood     Status: None   Collection Time: 04/27/18 12:59 PM  Result Value Ref Range   Lipase 24 11 - 51 U/L    Comment: Performed at [facility], [address] [place], [place] [address]  Comprehensive Metabolic Panel (CMET)     Status: Abnormal   Collection Time: 04/27/18 12:59 PM  Result Value Ref Range   Sodium 137 135 - 145 mmol/L   Potassium 3.5 3.5 - 5.1 mmol/L   Chloride 107 98 - 111 mmol/L   CO2 24 22 - 32 mmol/L    Glucose, Bld 94 70 - 99 mg/dL   BUN 14 6 - 20 mg/dL   Creatinine, Ser 9.81 0.61 - 1.24 mg/dL   Calcium 8.9 8.9 - 19.1 mg/dL   Total Protein 7.9 6.5 - 8.1 g/dL   Albumin 4.5 3.5 - 5.0 g/dL   AST 48 (H) 15 - 41 U/L   ALT 113 (H) 0 - 44 U/L   Alkaline Phosphatase 52 38 - 126 U/L   Total Bilirubin 0.5 0.3 - 1.2 mg/dL   GFR calc non Af Amer >60 >60 mL/min   GFR calc Af  Amer >60 >60 mL/min   Anion gap 6 5 - 15    Comment: Performed at [facility], [address] [place], [place] [address]  CBC w/Diff/Platelet     Status: None   Collection Time: 04/27/18 12:59 PM  Result Value Ref Range   WBC 7.0 4.0 - 10.5 K/uL   RBC 5.28 4.22 - 5.81 MIL/uL   Hemoglobin 15.6 13.0 - 17.0 g/dL   HCT 08.6 57.8 - 46.9 %   MCV 85.6 80.0 - 100.0 fL   MCH 29.5 26.0 - 34.0 pg   MCHC 34.5 30.0 - 36.0 g/dL   RDW 62.9 52.8 - 41.3 %   Platelets 322 150 - 400 K/uL   nRBC 0.0 0.0 - 0.2 %   Neutrophils Relative % 62 %   Neutro Abs 4.3 1.7 - 7.7 K/uL   Lymphocytes Relative 27 %   Lymphs Abs 1.9 0.7 - 4.0 K/uL   Monocytes Relative 9 %   Monocytes Absolute 0.7 0.1 - 1.0 K/uL   Eosinophils Relative 2 %   Eosinophils Absolute 0.1 0.0 - 0.5 K/uL   Basophils Relative 0 %   Basophils Absolute 0.0 0.0 - 0.1 K/uL   Immature Granulocytes 0 %   Abs Immature Granulocytes 0.01 0.00 - 0.07 K/uL    Comment: Performed at [facility], [address] [place], [place] [address]  Hepatic function panel     Status: Abnormal   Collection Time: 04/27/18 12:59 PM  Result Value Ref Range   Total Protein 7.8 6.5 - 8.1 g/dL   Albumin 4.5 3.5 - 5.0 g/dL   AST 48 (H) 15 - 41 U/L   ALT 112 (H) 0 - 44 U/L   Alkaline Phosphatase 47 38 - 126 U/L   Total Bilirubin 0.4 0.3 - 1.2 mg/dL   Bilirubin, Direct <0.2 0.0 - 0.2 mg/dL   Indirect Bilirubin NOT CALCULATED 0.3 - 0.9 mg/dL    Comment: Performed at [facility], [address] [place], [place] [address]  Hepatitis panel, acute     Status: None    Collection Time: 04/27/18 12:59 PM  Result Value Ref Range   Hepatitis B Surface Ag Negative Negative   HCV Ab <0.1 0.0 - 0.9 s/co ratio    Comment: (NOTE)                                  Negative:     < 0.8                             Indeterminate: 0.8 - 0.9                                  Positive:     > 0.9 The CDC recommends that a positive HCV antibody result be followed up with a HCV Nucleic Acid Amplification test (644034). Performed At: [facility] [address] [place], [place] [id number] Jolene Schimke MD VF:[id number]    Hep A IgM Negative Negative   Hep B C IgM Negative Negative  Gastrointestinal Panel by PCR , Stool     Status: Abnormal   Collection Time: 04/27/18  6:52 PM  Result Value Ref Range   Campylobacter species NOT DETECTED NOT DETECTED   Plesimonas shigelloides NOT DETECTED NOT DETECTED  Salmonella species NOT DETECTED NOT DETECTED   Yersinia enterocolitica NOT DETECTED NOT DETECTED   Vibrio species NOT DETECTED NOT DETECTED   Vibrio cholerae NOT DETECTED NOT DETECTED   Enteroaggregative E coli (EAEC) NOT DETECTED NOT DETECTED   Enteropathogenic E coli (EPEC) DETECTED (A) NOT DETECTED    Comment: RESULT CALLED TO, READ BACK BY AND VERIFIED WITH:  EMILY BOYCE FNP AT 1226 04/30/18 SDR    Enterotoxigenic E coli (ETEC) NOT DETECTED NOT DETECTED   Shiga like toxin producing E coli (STEC) NOT DETECTED NOT DETECTED   Shigella/Enteroinvasive E coli (EIEC) NOT DETECTED NOT DETECTED   Cryptosporidium NOT DETECTED NOT DETECTED   Cyclospora cayetanensis NOT DETECTED NOT DETECTED   Entamoeba histolytica NOT DETECTED NOT DETECTED   Giardia lamblia NOT DETECTED NOT DETECTED   Adenovirus F40/41 NOT DETECTED NOT DETECTED   Astrovirus NOT DETECTED NOT DETECTED   Norovirus GI/GII NOT DETECTED NOT DETECTED   Rotavirus A NOT DETECTED NOT DETECTED   Sapovirus (I, II, IV, and V) DETECTED (A) NOT DETECTED    Comment: Performed at [facility], [address], [place], [place] [address]      PHQ2/9: Depression screen [facility] [place] [facility] 2/9 07/17/2018 06/26/2018 06/15/2018 04/24/2017 03/23/2017  Decreased Interest 1 3 2  0 0  Down, Depressed, Hopeless 1 3 3  0 0  PHQ - 2 Score 2 6 5  0 0  Altered sleeping 2 1 2  - -  Tired, decreased energy 1 2 3  - -  Change in appetite 0 0 0 - -  Feeling bad or failure about yourself  1 2 3  - -  Trouble concentrating 0 1 3 - -  Moving slowly or fidgety/restless 1 3 3  - -  Suicidal thoughts 0 1 1 - -  PHQ-9 Score 7 16 20  - -  Difficult doing work/chores Somewhat difficult Very difficult Somewhat difficult - -     Fall Risk: Fall Risk  07/17/2018 06/26/2018 04/27/2018 03/12/2018 01/19/2018  Falls in the past year? 0 0 0 0 No  Number falls in past yr: 0 0 0 - -  Injury with Fall? 0 0 0 - -      Assessment & Plan  1. Severe episode of recurrent major depressive disorder, without psychotic features (HCC)  Continue Trintelix, doing better , continue therapy  2. Elevated liver enzymes  - COMPLETE METABOLIC PANEL WITH GFR  3. Screening, lipid  - Lipid panel  4. Diabetes mellitus screening  - Hemoglobin A1c  5. Long-term use of high-risk medication  - COMPLETE METABOLIC PANEL WITH GFR  6. Migraine without aura and without status migrainosus, not intractable  Doing well at this time

## 2018-07-18 LAB — COMPLETE METABOLIC PANEL WITH GFR
AG Ratio: 1.5 (calc) (ref 1.0–2.5)
ALT: 49 U/L — ABNORMAL HIGH (ref 9–46)
AST: 27 U/L (ref 10–40)
Albumin: 4.5 g/dL (ref 3.6–5.1)
Alkaline phosphatase (APISO): 48 U/L (ref 36–130)
BUN: 22 mg/dL (ref 7–25)
CO2: 23 mmol/L (ref 20–32)
Calcium: 9.5 mg/dL (ref 8.6–10.3)
Chloride: 106 mmol/L (ref 98–110)
Creat: 0.94 mg/dL (ref 0.60–1.35)
GFR, Est African American: 119 mL/min/{1.73_m2} (ref >=60)
GFR, Est Non African American: 102 mL/min/{1.73_m2} (ref >=60)
Globulin: 3 g/dL (calc) (ref 1.9–3.7)
Glucose, Bld: 94 mg/dL (ref 65–99)
Potassium: 4 mmol/L (ref 3.5–5.3)
Sodium: 138 mmol/L (ref 135–146)
Total Bilirubin: 0.4 mg/dL (ref 0.2–1.2)
Total Protein: 7.5 g/dL (ref 6.1–8.1)

## 2018-07-18 LAB — HEMOGLOBIN A1C
Hgb A1c MFr Bld: 5.2 % of total Hgb (ref <5.7)
MEAN PLASMA GLUCOSE: 103 (calc)
eAG (mmol/L): 5.7 (calc)

## 2018-07-18 LAB — LIPID PANEL
Cholesterol: 222 mg/dL — ABNORMAL HIGH (ref <200)
HDL: 45 mg/dL (ref >=40)
LDL Cholesterol (Calc): 151 mg/dL (calc) — ABNORMAL HIGH
NON-HDL CHOLESTEROL (CALC): 177 mg/dL — AB (ref <130)
Total CHOL/HDL Ratio: 4.9 (calc) (ref <5.0)
Triglycerides: 131 mg/dL (ref <150)

## 2018-07-30 ENCOUNTER — Encounter: Payer: Self-pay | Admitting: Family Medicine

## 2018-07-30 ENCOUNTER — Ambulatory Visit (INDEPENDENT_AMBULATORY_CARE_PROVIDER_SITE_OTHER): Payer: BC Managed Care – PPO | Admitting: Family Medicine

## 2018-07-30 DIAGNOSIS — F332 Major depressive disorder, recurrent severe without psychotic features: Secondary | ICD-10-CM | POA: Diagnosis not present

## 2018-07-30 DIAGNOSIS — F5104 Psychophysiologic insomnia: Secondary | ICD-10-CM | POA: Diagnosis not present

## 2018-07-30 MED ORDER — HYDROXYZINE HCL 10 MG PO TABS: 10.0000 mg | ORAL_TABLET | Freq: Three times a day (TID) | ORAL | 0 refills | Status: DC | PRN

## 2018-07-30 NOTE — Progress Notes (Signed)
Name: LINDA SEPER   MRN: [id number]    DOB: [birth year]   Date:07/30/2018       Progress Note  Subjective  Chief Complaint  Chief Complaint  Patient presents with  . Insomnia    Onset 3 weeks ago    I connected with@ on 07/30/18 at  9:20 AM EDT by a video enabled telemedicine application and verified that I am speaking with the correct person using two identifiers.  I discussed the limitations of evaluation and management by telemedicine and the availability of in person appointments. The patient expressed understanding and agreed to proceed. Staff also discussed with the patient that there may be a patient responsible charge related to this service. Patient Location: at home  Provider Location: [facility]   HPI  Insomnia: getting up around 9-10 am since school has been out because of COVID-19, taking naps occasionally, going to bed around 10 pm and cannot fall asleep but most of the time able to stay asleep He is still walking and doing virtual exercise classes with his gym. Taking Tylenol PM to sleep   MDD: depression was getting worse and he went up on Trintelix to 20 mg this past weekend.    Patient Active Problem List   Diagnosis Date Noted  . Severe episode of recurrent major depressive disorder, without psychotic features (HCC) 07/17/2018  . Headache, migraine 06/08/2015  . Obesity (BMI 30.0-34.9) 06/08/2015  . Allergic rhinitis, seasonal 07/26/2013    Past Surgical History:  Procedure Laterality Date  . RHINOPLASTY    . TONSILLECTOMY      Family History  Problem Relation Age of Onset  . Cancer Mother        Breast  . Hypertension Father   . Heart disease Father   . Heart attack Father   . Cancer Maternal Aunt        Breast  . Hyperlipidemia Maternal Grandmother   . Parkinson's disease Paternal Grandfather     Social History   Socioeconomic History  . Marital status: Married    Spouse name: Morrie Sheldon   . Number of children: 0  .  Years of education: Not on file  . Highest education level: Bachelor's degree (e.g., BA, AB, BS)  Occupational History  . Occupation: Runner, broadcasting/film/video   Social Needs  . Financial resource strain: Somewhat hard  . Food insecurity:    Worry: Never true    Inability: Never true  . Transportation needs:    Medical: No    Non-medical: No  Tobacco Use  . Smoking status: Never Smoker  . Smokeless tobacco: Never Used  Substance and Sexual Activity  . Alcohol use: No    Alcohol/week: 0.0 standard drinks  . Drug use: No  . Sexual activity: Yes    Partners: Female  Lifestyle  . Physical activity:    Days per week: 7 days    Minutes per session: 30 min  . Stress: Only a little  Relationships  . Social connections:    Talks on phone: More than three times a week    Gets together: Twice a week    Attends religious service: More than 4 times per year    Active member of club or organization: Yes    Attends meetings of clubs or organizations: More than 4 times per year    Relationship status: Married  . Intimate partner violence:    Fear of current or ex partner: No    Emotionally abused: No  Physically abused: No    Forced sexual activity: No  Other Topics Concern  . Not on file  Social History Narrative  . Not on file     Current Outpatient Medications:  .  vortioxetine HBr (TRINTELLIX) 20 MG TABS tablet, Take 1 tablet (20 mg total) by mouth daily., Disp: 30 tablet, Rfl: 0  Allergies  Allergen Reactions  . Penicillins Rash    I personally reviewed active problem list, medication list, allergies, family history, social history with the patient/caregiver today.   ROS  Ten systems reviewed and is negative except as mentioned in HPI   Objective  Virtual encounter, vitals not obtained.   Physical Exam  Awake, alert in no idstress   PHQ2/9: Depression screen [facility] 2/9 07/30/2018 07/17/2018 06/26/2018 06/15/2018 04/24/2017  Decreased Interest 1 1 3 2  0  Down, Depressed, Hopeless  1 1 3 3  0  PHQ - 2 Score 2 2 6 5  0  Altered sleeping 3 2 1 2  -  Tired, decreased energy 3 1 2 3  -  Change in appetite 0 0 0 0 -  Feeling bad or failure about yourself  0 1 2 3  -  Trouble concentrating 0 0 1 3 -  Moving slowly or fidgety/restless 1 1 3 3  -  Suicidal thoughts 0 0 1 1 -  PHQ-9 Score 9 7 16 20  -  Difficult doing work/chores Somewhat difficult Somewhat difficult Very difficult Somewhat difficult -   PHQ-2/9 Result is positive   Fall Risk: Fall Risk  07/30/2018 07/17/2018 06/26/2018 04/27/2018 03/12/2018  Falls in the past year? 0 0 0 0 0  Number falls in past yr: - 0 0 0 -  Injury with Fall? - 0 0 0 -     Assessment & Plan  1. Psychophysiological insomnia  - hydrOXYzine (ATARAX/VISTARIL) 10 MG tablet; Take 1 tablet (10 mg total) by mouth 3 (three) times daily as needed.  Dispense: 30 tablet; Refill: 0  2. Severe episode of recurrent major depressive disorder, without psychotic features (HCC)  I discussed the assessment and treatment plan with the patient. The patient was provided an opportunity to ask questions and all were answered. The patient agreed with the plan and demonstrated an understanding of the instructions.  The patient was advised to call back or seek an in-person evaluation if the symptoms worsen or if the condition fails to improve as anticipated.  I provided 15  minutes of non-face-to-face time during this encounter.

## 2018-08-29 ENCOUNTER — Other Ambulatory Visit: Payer: Self-pay

## 2018-08-29 ENCOUNTER — Encounter: Payer: Self-pay | Admitting: Family Medicine

## 2018-08-29 ENCOUNTER — Ambulatory Visit (INDEPENDENT_AMBULATORY_CARE_PROVIDER_SITE_OTHER): Payer: BC Managed Care – PPO | Admitting: Family Medicine

## 2018-08-29 VITALS — Ht 70.0 in | Wt 230.0 lb

## 2018-08-29 DIAGNOSIS — F332 Major depressive disorder, recurrent severe without psychotic features: Secondary | ICD-10-CM

## 2018-08-29 DIAGNOSIS — F411 Generalized anxiety disorder: Secondary | ICD-10-CM | POA: Diagnosis not present

## 2018-08-29 DIAGNOSIS — F5104 Psychophysiologic insomnia: Secondary | ICD-10-CM

## 2018-08-29 MED ORDER — DIVALPROEX SODIUM 250 MG PO DR TAB: 250.0000 mg | DELAYED_RELEASE_TABLET | Freq: Every evening | ORAL | 0 refills | Status: DC

## 2018-08-29 MED ORDER — VORTIOXETINE HBR 20 MG PO TABS: 20.0000 mg | ORAL_TABLET | Freq: Every day | ORAL | 0 refills | Status: DC

## 2018-08-29 MED ORDER — HYDROXYZINE HCL 10 MG PO TABS: 10.0000 mg | ORAL_TABLET | Freq: Three times a day (TID) | ORAL | 0 refills | Status: DC | PRN

## 2018-08-29 NOTE — Progress Notes (Signed)
Name: Gregory Matthews   MRN: [id number]    DOB: [birth year]   Date:08/29/2018       Progress Note  Subjective  Chief Complaint  Chief Complaint  Patient presents with  . Medication Refill  . Depression    Wanted to discuss going on stronger dosage-having mood swings with it  . Migraine  . Insomnia    Sleep is improving 8 to 9 hours nightly    I connected with  Yetta GlassmanBenjamin L Stubbe  on 08/29/18 at  1:20 PM EDT by a video enabled telemedicine application and verified that I am speaking with the correct person using two identifiers.  I discussed the limitations of evaluation and management by telemedicine and the availability of in person appointments. The patient expressed understanding and agreed to proceed. Staff also discussed with the patient that there may be a patient responsible charge related to this service. Patient Location: home [facility]   HPI  MDD/GAD: he is getting marital counseling and is now going to see another therapist just for himself ( name of the therapist Leanor RubensteinMatthew Sixberry ). His Phq 9 has gotten worse. He is trying to sell his house and buying a new house and has been more stressed. He is taking his medication, but has notice mood swings, he asked for a higher dose of medication , explained Trintelix highest dose is 20 mg, we will add a mood stabilizer and also advised to titrate hydroxyzine to 10 mg tid and monitor.  He has been taking care of himself. Going for walks, doing some meditation, seeing therapist and taking his medications, Congratulated him on all his effort to get better.    Patient Active Problem List   Diagnosis Date Noted  . Severe episode of recurrent major depressive disorder, without psychotic features (HCC) 07/17/2018  . Headache, migraine 06/08/2015  . Obesity (BMI 30.0-34.9) 06/08/2015  . Allergic rhinitis, seasonal 07/26/2013    Past Surgical History:  Procedure Laterality Date  . RHINOPLASTY    .  TONSILLECTOMY      Family History  Problem Relation Age of Onset  . Cancer Mother        Breast  . Hypertension Father   . Heart disease Father   . Heart attack Father   . Cancer Maternal Aunt        Breast  . Hyperlipidemia Maternal Grandmother   . Parkinson's disease Paternal Grandfather     Social History   Socioeconomic History  . Marital status: Married    Spouse name: Morrie Sheldonshley   . Number of children: 0  . Years of education: Not on file  . Highest education level: Bachelor's degree (e.g., BA, AB, BS)  Occupational History  . Occupation: Runner, broadcasting/film/videoteacher   Social Needs  . Financial resource strain: Somewhat hard  . Food insecurity:    Worry: Never true    Inability: Never true  . Transportation needs:    Medical: No    Non-medical: No  Tobacco Use  . Smoking status: Never Smoker  . Smokeless tobacco: Never Used  Substance and Sexual Activity  . Alcohol use: No    Alcohol/week: 0.0 standard drinks  . Drug use: No  . Sexual activity: Yes    Partners: Female  Lifestyle  . Physical activity:    Days per week: 7 days    Minutes per session: 30 min  . Stress: Only a little  Relationships  . Social connections:    Talks on phone:  More than three times a week    Gets together: Twice a week    Attends religious service: More than 4 times per year    Active member of club or organization: Yes    Attends meetings of clubs or organizations: More than 4 times per year    Relationship status: Married  . Intimate partner violence:    Fear of current or ex partner: No    Emotionally abused: No    Physically abused: No    Forced sexual activity: No  Other Topics Concern  . Not on file  Social History Narrative  . Not on file     Current Outpatient Medications:  .  hydrOXYzine (ATARAX/VISTARIL) 10 MG tablet, Take 1 tablet (10 mg total) by mouth 3 (three) times daily as needed., Disp: 30 tablet, Rfl: 0 .  vortioxetine HBr (TRINTELLIX) 20 MG TABS tablet, Take 1 tablet (20  mg total) by mouth daily., Disp: 30 tablet, Rfl: 0  Allergies  Allergen Reactions  . Penicillins Rash    I personally reviewed active problem list, medication list, allergies, family history, social history with the patient/caregiver today.   ROS  Ten systems reviewed and is negative except as mentioned in HPI   Objective  Virtual encounter, vitals not obtained.  Body mass index is 33 kg/m.  Physical Exam  Awake, alert and oriented   PHQ2/9: Depression screen [facility] 2/9 08/29/2018 07/30/2018 07/17/2018 06/26/2018 06/15/2018  Decreased Interest 1 1 1 3 2   Down, Depressed, Hopeless 3 1 1 3 3   PHQ - 2 Score 4 2 2 6 5   Altered sleeping 1 3 2 1 2   Tired, decreased energy 0 3 1 2 3   Change in appetite 0 0 0 0 0  Feeling bad or failure about yourself  2 0 1 2 3   Trouble concentrating 0 0 0 1 3  Moving slowly or fidgety/restless 1 1 1 3 3   Suicidal thoughts 1 0 0 1 1  PHQ-9 Score 9 9 7 16 20   Difficult doing work/chores Very difficult Somewhat difficult Somewhat difficult Very difficult Somewhat difficult   PHQ-2/9 Result is positive.    Fall Risk: Fall Risk  08/29/2018 07/30/2018 07/17/2018 06/26/2018 04/27/2018  Falls in the past year? 0 0 0 0 0  Number falls in past yr: 0 - 0 0 0  Injury with Fall? 0 - 0 0 0    GAD 7 : Generalized Anxiety Score 07/30/2018 07/17/2018 06/26/2018 06/15/2018  Nervous, Anxious, on Edge 1 1 2 3   Control/stop worrying 1 0 3 3  Worry too much - different things 0 0 3 3  Trouble relaxing 1 0 1 2  Restless 0 0 0 0  Easily annoyed or irritable 1 2 2 2   Afraid - awful might happen 0 0 1 1  Total GAD 7 Score 4 3 12 14   Anxiety Difficulty Somewhat difficult - - Somewhat difficult     Assessment & Plan  1. Psychophysiological insomnia  - hydrOXYzine (ATARAX/VISTARIL) 10 MG tablet; Take 1 tablet (10 mg total) by mouth 3 (three) times daily as needed.  Dispense: 90 tablet; Refill: 0  2. Severe episode of recurrent major depressive disorder, without psychotic  features (HCC)  - divalproex (DEPAKOTE) 250 MG DR tablet; Take 1 tablet (250 mg total) by mouth every evening.  Dispense: 30 tablet; Refill: 0 - vortioxetine HBr (TRINTELLIX) 20 MG TABS tablet; Take 1 tablet (20 mg total) by mouth daily.  Dispense: 30 tablet; Refill: 0  3. GAD (generalized anxiety disorder)  - hydrOXYzine (ATARAX/VISTARIL) 10 MG tablet; Take 1 tablet (10 mg total) by mouth 3 (three) times daily as needed.  Dispense: 90 tablet; Refill: 0  I discussed the assessment and treatment plan with the patient. The patient was provided an opportunity to ask questions and all were answered. The patient agreed with the plan and demonstrated an understanding of the instructions.  The patient was advised to call back or seek an in-person evaluation if the symptoms worsen or if the condition fails to improve as anticipated.  I provided 25  minutes of non-face-to-face time during this encounter.

## 2018-08-30 ENCOUNTER — Ambulatory Visit: Payer: BC Managed Care – PPO | Admitting: Gastroenterology

## 2018-09-03 ENCOUNTER — Ambulatory Visit: Payer: BC Managed Care – PPO | Admitting: Gastroenterology

## 2018-09-25 ENCOUNTER — Other Ambulatory Visit: Payer: Self-pay | Admitting: Family Medicine

## 2018-09-25 DIAGNOSIS — F332 Major depressive disorder, recurrent severe without psychotic features: Secondary | ICD-10-CM

## 2018-09-27 ENCOUNTER — Ambulatory Visit (INDEPENDENT_AMBULATORY_CARE_PROVIDER_SITE_OTHER): Payer: BC Managed Care – PPO | Admitting: Family Medicine

## 2018-09-27 ENCOUNTER — Encounter: Payer: Self-pay | Admitting: Family Medicine

## 2018-09-27 DIAGNOSIS — F5104 Psychophysiologic insomnia: Secondary | ICD-10-CM | POA: Diagnosis not present

## 2018-09-27 DIAGNOSIS — F332 Major depressive disorder, recurrent severe without psychotic features: Secondary | ICD-10-CM

## 2018-09-27 MED ORDER — AMPHETAMINE-DEXTROAMPHET ER 15 MG PO CP24: 15.0000 mg | ORAL_CAPSULE | ORAL | 0 refills | Status: DC

## 2018-09-27 MED ORDER — DIVALPROEX SODIUM 250 MG PO DR TAB: 250.0000 mg | DELAYED_RELEASE_TABLET | Freq: Every evening | ORAL | 0 refills | Status: DC

## 2018-09-27 MED ORDER — VORTIOXETINE HBR 20 MG PO TABS: 20.0000 mg | ORAL_TABLET | Freq: Every day | ORAL | 0 refills | Status: DC

## 2018-09-27 NOTE — Progress Notes (Signed)
Name: Gregory Matthews   MRN: [id number]    DOB: [birth year]   Date:09/27/2018       Progress Note  Subjective  Chief Complaint  Chief Complaint  Patient presents with  . Anxiety  . Depression    I connected with  Sanjaya Philemon Appleton  on 09/27/18 at  9:40 AM EDT by a video enabled telemedicine application and verified that I am speaking with the correct person using two identifiers.  I discussed the limitations of evaluation and management by telemedicine and the availability of in person appointments. The patient expressed understanding and agreed to proceed. Staff also discussed with the patient that there may be a patient responsible charge related to this service. Patient Location: at home Provider Location: [facility]   HPI  MDD/GAD: he is getting marital counseling and is now going to see another therapist just for himself ( name of the therapist Leanor Rubenstein  Appointments are now every 2 weeks). His Phq 9 has improved, but still feels bad about himself and has suicidal thoughts.  They are moving this week and is feeling a little overwhelmed about it. He states mood swings improved with Depakote  He is compliant with medication, but states that feels very down at times, he usually goes outside in the son and talks to his wife when that happens. We will add stimulant, discussed possible side effects, return in one month in the office to check bp during visit    Patient Active Problem List   Diagnosis Date Noted  . Severe episode of recurrent major depressive disorder, without psychotic features (HCC) 07/17/2018  . Headache, migraine 06/08/2015  . Obesity (BMI 30.0-34.9) 06/08/2015  . Allergic rhinitis, seasonal 07/26/2013    Past Surgical History:  Procedure Laterality Date  . RHINOPLASTY    . TONSILLECTOMY      Family History  Problem Relation Age of Onset  . Cancer Mother        Breast  . Hypertension Father   . Heart disease Father   . Heart  attack Father   . Cancer Maternal Aunt        Breast  . Hyperlipidemia Maternal Grandmother   . Parkinson's disease Paternal Grandfather     Social History   Socioeconomic History  . Marital status: Married    Spouse name: Morrie Sheldon   . Number of children: 0  . Years of education: Not on file  . Highest education level: Bachelor's degree (e.g., BA, AB, BS)  Occupational History  . Occupation: Runner, broadcasting/film/video   Social Needs  . Financial resource strain: Somewhat hard  . Food insecurity:    Worry: Never true    Inability: Never true  . Transportation needs:    Medical: No    Non-medical: No  Tobacco Use  . Smoking status: Never Smoker  . Smokeless tobacco: Never Used  Substance and Sexual Activity  . Alcohol use: No    Alcohol/week: 0.0 standard drinks  . Drug use: No  . Sexual activity: Yes    Partners: Female  Lifestyle  . Physical activity:    Days per week: 7 days    Minutes per session: 30 min  . Stress: Only a little  Relationships  . Social connections:    Talks on phone: More than three times a week    Gets together: Twice a week    Attends religious service: More than 4 times per year    Active member of club or organization: Yes  Attends meetings of clubs or organizations: More than 4 times per year    Relationship status: Married  . Intimate partner violence:    Fear of current or ex partner: No    Emotionally abused: No    Physically abused: No    Forced sexual activity: No  Other Topics Concern  . Not on file  Social History Narrative  . Not on file     Current Outpatient Medications:  .  divalproex (DEPAKOTE) 250 MG DR tablet, Take 1 tablet (250 mg total) by mouth every evening., Disp: 90 tablet, Rfl: 0 .  hydrOXYzine (ATARAX/VISTARIL) 10 MG tablet, Take 1 tablet (10 mg total) by mouth 3 (three) times daily as needed., Disp: 90 tablet, Rfl: 0 .  vortioxetine HBr (TRINTELLIX) 20 MG TABS tablet, Take 1 tablet (20 mg total) by mouth daily., Disp: 90  tablet, Rfl: 0 .  amphetamine-dextroamphetamine (ADDERALL XR) 15 MG 24 hr capsule, Take 1 capsule by mouth every morning., Disp: 30 capsule, Rfl: 0  Allergies  Allergen Reactions  . Penicillins Rash    I personally reviewed active problem list, medication list, allergies, family history, social history with the patient/caregiver today.   ROS  Ten systems reviewed and is negative except as mentioned in HPI   Objective  Virtual encounter, vitals not obtained.  There is no height or weight on file to calculate BMI.  Physical Exam  Awake, alert and oriented   PHQ2/9: [facility] 2[facility] 09/27/2018 08/29/2018 07/30/2018 07/17/2018 06/26/2018  Decreased Interest 1 1 1 1 3   Down, Depressed, Hopeless 1 3 1 1 3   PHQ - 2 Score 2 4 2 2 6   Altered sleeping 0 1 3 2 1   Tired, decreased energy 1 0 3 1 2   Change in appetite 0 0 0 0 0  Feeling bad or failure about yourself  1 2 0 1 2  Trouble concentrating 1 0 0 0 1  Moving slowly or fidgety/restless 0 1 1 1 3   Suicidal thoughts 1 1 0 0 1  PHQ-9 Score 6 9 9 7 16   Difficult doing work/chores Very difficult Very difficult Somewhat difficult Somewhat difficult Very difficult   PHQ-2/9 Result is positive.    Fall Risk: Fall Risk  09/27/2018 08/29/2018 07/30/2018 07/17/2018 06/26/2018  Falls in the past year? 0 0 0 0 0  Number falls in past yr: 0 0 - 0 0  Injury with Fall? 0 0 - 0 0    Assessment & Plan  1. Severe episode of recurrent major depressive disorder, without psychotic features (HCC)  - vortioxetine HBr (TRINTELLIX) 20 MG TABS tablet; Take 1 tablet (20 mg total) by mouth daily.  Dispense: 90 tablet; Refill: 0 - divalproex (DEPAKOTE) 250 MG DR tablet; Take 1 tablet (250 mg total) by mouth every evening.  Dispense: 90 tablet; Refill: 0 - amphetamine-dextroamphetamine (ADDERALL XR) 15 MG 24 hr capsule; Take 1 capsule by mouth every morning.  Dispense: 30 capsule; Refill: 0  2. Psychophysiological insomnia  Doing well   I discussed  the assessment and treatment plan with the patient. The patient was provided an opportunity to ask questions and all were answered. The patient agreed with the plan and demonstrated an understanding of the instructions.  The patient was advised to call back or seek an in-person evaluation if the symptoms worsen or if the condition fails to improve as anticipated.  I provided 25 minutes of non-face-to-face time during this encounter.

## 2018-10-18 ENCOUNTER — Ambulatory Visit: Payer: BC Managed Care – PPO | Admitting: Family Medicine

## 2018-11-06 ENCOUNTER — Ambulatory Visit: Payer: BC Managed Care – PPO | Admitting: Family Medicine

## 2018-11-06 ENCOUNTER — Other Ambulatory Visit: Payer: Self-pay

## 2018-11-06 ENCOUNTER — Encounter: Payer: Self-pay | Admitting: Family Medicine

## 2018-11-06 VITALS — BP 108/64 | HR 81 | Temp 96.8°F | Resp 16 | Ht 70.0 in | Wt 241.6 lb

## 2018-11-06 DIAGNOSIS — F411 Generalized anxiety disorder: Secondary | ICD-10-CM

## 2018-11-06 DIAGNOSIS — R0789 Other chest pain: Secondary | ICD-10-CM | POA: Diagnosis not present

## 2018-11-06 DIAGNOSIS — F330 Major depressive disorder, recurrent, mild: Secondary | ICD-10-CM | POA: Diagnosis not present

## 2018-11-06 DIAGNOSIS — R079 Chest pain, unspecified: Secondary | ICD-10-CM | POA: Diagnosis not present

## 2018-11-06 MED ORDER — AMPHETAMINE-DEXTROAMPHET ER 20 MG PO CP24: 20.0000 mg | ORAL_CAPSULE | ORAL | 0 refills | Status: DC

## 2018-11-06 NOTE — Progress Notes (Signed)
Name: Gregory Matthews   MRN: [id number]    DOB: 1981-03-05   Date:11/06/2018       Progress Note  Subjective  Chief Complaint  Chief Complaint  Patient presents with  . Chest Pain    States for the past week when he bends over he feels like something is stuck in his throat. Chest Pain- is intermittent in the middle of his sternum   . Depression    Pharmacy kept telling patient Adderall was denied and never got it.  . Follow-up    HPI  MDD/GAD: he is getting marital counseling and is now going to see another therapist just for himself ( name of the therapist Leanor RubensteinMatthew Sixberry  Appointments about once a month for himself and about every 3 weeks with his wife ). His Phq 9 is stable, he no longer has suicidal thoughts. He states mood swings improved with Depakote  He is compliant with medication, the down days are not as intense, only one episode in the past two weeks. They sold [place] condo, [place] their house is done at the end of this month.  We gave him a rx of Adderal XR but not filled secondary to insurance denial, [facility]   Substernal chest pain: he states he has noticed substernal chest soreness, worse with movement but not associated with sob or palpitation. No rashes. He moved to an [employer] and is doing more laundry and cooking. He states the only incident was that he tripped on his dog and fell with his arms outstretched and felt a jolt on his chest    Patient Active Problem List   Diagnosis Date Noted  . Severe episode of recurrent major depressive disorder, without psychotic features (HCC) 07/17/2018  . Headache, migraine 06/08/2015  . Obesity (BMI 30.0-34.9) 06/08/2015  . Allergic rhinitis, seasonal 07/26/2013    Past Surgical History:  Procedure Laterality Date  . RHINOPLASTY    . TONSILLECTOMY      Family History  Problem Relation Age of Onset  . Cancer Mother        Breast  . Hypertension Father   . Heart  disease Father   . Heart attack Father   . Cancer Maternal Aunt        Breast  . Hyperlipidemia Maternal Grandmother   . Parkinson's disease Paternal Grandfather     Social History   Socioeconomic History  . Marital status: Married    Spouse name: Morrie Sheldonshley   . Number of children: 0  . Years of education: Not on file  . Highest education level: Bachelor's degree (e.g., BA, AB, BS)  Occupational History  . Occupation: Runner, broadcasting/film/videoteacher   Social Needs  . Financial resource strain: Somewhat hard  . Food insecurity    Worry: Never true    Inability: Never true  . Transportation needs    Medical: No    Non-medical: No  Tobacco Use  . Smoking status: Never Smoker  . Smokeless tobacco: Never Used  Substance and Sexual Activity  . Alcohol use: No    Alcohol/week: 0.0 standard drinks  . Drug use: No  . Sexual activity: Yes    Partners: Female  Lifestyle  . Physical activity    Days per week: 7 days    Minutes per session: 30 min  . Stress: Only a little  Relationships  . Social connections    Talks on phone: More than three  times a week    Gets together: Twice a week    Attends religious service: More than 4 times per year    Active member of club or organization: Yes    Attends meetings of clubs or organizations: More than 4 times per year    Relationship status: Married  . Intimate partner violence    Fear of current or ex partner: No    Emotionally abused: No    Physically abused: No    Forced sexual activity: No  Other Topics Concern  . Not on file  Social History Narrative  . Not on file     Current Outpatient Medications:  .  amphetamine-dextroamphetamine (ADDERALL XR) 15 MG 24 hr capsule, Take 1 capsule by mouth every morning., Disp: 30 capsule, Rfl: 0 .  divalproex (DEPAKOTE) 250 MG DR tablet, Take 1 tablet (250 mg total) by mouth every evening., Disp: 90 tablet, Rfl: 0 .  hydrOXYzine (ATARAX/VISTARIL) 10 MG tablet, Take 1 tablet (10 mg total) by mouth 3 (three) times  daily as needed., Disp: 90 tablet, Rfl: 0 .  vortioxetine HBr (TRINTELLIX) 20 MG TABS tablet, Take 1 tablet (20 mg total) by mouth daily., Disp: 90 tablet, Rfl: 0  Allergies  Allergen Reactions  . Penicillins Rash    I personally reviewed active problem list, medication list, allergies, family history, social history with the patient/caregiver today.   ROS  Constitutional: Negative for fever or weight change.  Respiratory: Negative for cough and shortness of breath.   Cardiovascular: Negative for chest pain or palpitations.  Gastrointestinal: Negative for abdominal pain, no bowel changes.  Musculoskeletal: Negative for gait problem or joint swelling.  Skin: Negative for rash.  Neurological: Negative for dizziness or headache.  No other specific complaints in a complete review of systems (except as listed in HPI above).  Objective  Vitals:   11/06/18 1129  BP: 108/64  Pulse: 81  Resp: 16  Temp: (!) 96.8 F (36 C)  TempSrc: Temporal  SpO2: 97%  Weight: 241 lb 9.6 oz (109.6 kg)  Height: 5\' 10"  (1.778 m)    Body mass index is 34.67 kg/m.  Physical Exam  Constitutional: Patient appears well-developed and well-nourished. Obese  No distress.  HEENT: head atraumatic, normocephalic, pupils equal and reactive to light,neck supple Cardiovascular: Normal rate, regular rhythm and normal heart sounds, slightly tender during palpation of sternal and costochondral joint .  No murmur heard. No BLE edema. Pulmonary/Chest: Effort normal and breath sounds normal. No respiratory distress. Abdominal: Soft.  There is no tenderness. Psychiatric: Patient has a normal mood and affect. behavior is normal. Judgment and thought content normal.  PHQ2/9: Depression screen [facility] 2/9 11/06/2018 09/27/2018 08/29/2018 07/30/2018 07/17/2018  Decreased Interest 1 1 1 1 1   Down, Depressed, Hopeless 1 1 3 1 1   PHQ - 2 Score 2 2 4 2 2   Altered sleeping 2 0 1 3 2   Tired, decreased energy 1 1 0 3 1  Change in  appetite 0 0 0 0 0  Feeling bad or failure about yourself  1 1 2  0 1  Trouble concentrating 0 1 0 0 0  Moving slowly or fidgety/restless 0 0 1 1 1   Suicidal thoughts 0 1 1 0 0  PHQ-9 Score 6 6 9 9 7   Difficult doing work/chores Somewhat difficult Very difficult Very difficult Somewhat difficult Somewhat difficult  Some recent data might be hidden    phq 9 is positive   Fall Risk: Fall Risk  09/27/2018  08/29/2018 07/30/2018 07/17/2018 06/26/2018  Falls in the past year? 0 0 0 0 0  Number falls in past yr: 0 0 - 0 0  Injury with Fall? 0 0 - 0 0    Functional Status Survey: Is the patient deaf or have difficulty hearing?: No Does the patient have difficulty seeing, even when wearing glasses/contacts?: Yes Does the patient have difficulty concentrating, remembering, or making decisions?: No Does the patient have difficulty walking or climbing stairs?: No Does the patient have difficulty dressing or bathing?: No Does the patient have difficulty doing errands alone such as visiting a doctor's office or shopping?: No    Assessment & Plan  1. GAD (generalized anxiety disorder)  - EKG 12-Lead  2. Chest pain in adult  - EKG 12-Lead : normal sinus rhythm   3. Mild Major Depression Recurrent  (HCC)  - amphetamine-dextroamphetamine (ADDERALL XR) 20 MG 24 hr capsule; Take 1 capsule (20 mg total) by mouth every morning.  Dispense: 30 capsule; Refill: 0  4. Anterior chest wall pain  Reassurance, can take tylenol, topical medication

## 2018-12-07 ENCOUNTER — Other Ambulatory Visit: Payer: Self-pay | Admitting: Family Medicine

## 2018-12-07 DIAGNOSIS — F330 Major depressive disorder, recurrent, mild: Secondary | ICD-10-CM

## 2018-12-07 NOTE — Telephone Encounter (Signed)
Pt needs refill on Dextroamphetamine to be sent to [facility]. Pt only has 2 left.

## 2018-12-09 MED ORDER — AMPHETAMINE-DEXTROAMPHET ER 20 MG PO CP24: 20.0000 mg | ORAL_CAPSULE | ORAL | 0 refills | Status: DC

## 2018-12-14 ENCOUNTER — Other Ambulatory Visit: Payer: Self-pay | Admitting: Family Medicine

## 2018-12-14 DIAGNOSIS — F330 Major depressive disorder, recurrent, mild: Secondary | ICD-10-CM

## 2019-01-25 ENCOUNTER — Other Ambulatory Visit: Payer: Self-pay | Admitting: Family Medicine

## 2019-01-25 DIAGNOSIS — F332 Major depressive disorder, recurrent severe without psychotic features: Secondary | ICD-10-CM

## 2019-01-25 MED ORDER — VORTIOXETINE HBR 20 MG PO TABS: 20.0000 mg | ORAL_TABLET | Freq: Every day | ORAL | 0 refills | Status: DC

## 2019-01-25 NOTE — Telephone Encounter (Signed)
Pt has an appt on 02/08/2019 and needs a refill on his depression meds, Trintellix to be sent to [facility]. Pt will be out on Monday

## 2019-02-08 ENCOUNTER — Other Ambulatory Visit: Payer: Self-pay

## 2019-02-08 ENCOUNTER — Ambulatory Visit: Payer: BC Managed Care – PPO | Admitting: Family Medicine

## 2019-02-08 ENCOUNTER — Encounter: Payer: Self-pay | Admitting: Family Medicine

## 2019-02-08 VITALS — BP 120/80 | HR 67 | Temp 96.9°F | Resp 16 | Ht 70.0 in | Wt 247.4 lb

## 2019-02-08 DIAGNOSIS — G43009 Migraine without aura, not intractable, without status migrainosus: Secondary | ICD-10-CM | POA: Diagnosis not present

## 2019-02-08 DIAGNOSIS — F5104 Psychophysiologic insomnia: Secondary | ICD-10-CM

## 2019-02-08 DIAGNOSIS — F330 Major depressive disorder, recurrent, mild: Secondary | ICD-10-CM | POA: Diagnosis not present

## 2019-02-08 DIAGNOSIS — F411 Generalized anxiety disorder: Secondary | ICD-10-CM

## 2019-02-08 MED ORDER — AMPHETAMINE-DEXTROAMPHET ER 20 MG PO CP24: 20.0000 mg | ORAL_CAPSULE | ORAL | 0 refills | Status: DC

## 2019-02-08 MED ORDER — HYDROXYZINE HCL 10 MG PO TABS: 10.0000 mg | ORAL_TABLET | Freq: Three times a day (TID) | ORAL | 0 refills | Status: DC | PRN

## 2019-02-08 MED ORDER — DIVALPROEX SODIUM 250 MG PO DR TAB: 250.0000 mg | DELAYED_RELEASE_TABLET | Freq: Every evening | ORAL | 0 refills | Status: DC

## 2019-02-08 NOTE — Patient Instructions (Signed)
Never run out of Trintelix or Depakote  Hydroxyzine and adderal can be as needed

## 2019-02-08 NOTE — Progress Notes (Signed)
Name: Gregory Matthews   MRN: [id number]    DOB: November 12, 1980   Date:02/08/2019       Progress Note  Subjective  Chief Complaint  Chief Complaint  Patient presents with  . Depression    HPI  MDD/GAD: he is getting marital counseling and is now going to see another therapist just for himself ( name of the therapist Sherre ScarletMatthew SixberryAppointments about once a month for himself and about every 3 weeks with his wife ). His Phq 9 is stable, he no longer has suicidal thoughts. Hestates mood swings improved with Depakote He is compliant with medication, the down days are not as intense, only one episode in the past two weeks. They sold their condo, [place] to [place] until their house is done at the end of this month.  We gave him a rx of Adderal XR but not filled secondary to insurance denial, [facility]   Insomnia: he has noticed difficulty falling asleep over the past couple of weeks, taking hydroxyzine prn but not working as well. He has been out of depakote and we will resume medication, before adding anything else   Migraine:We have tried Topamax and Imitrex. He has stopped both medication secondary to side effects. Topamax he stopped because of change in taste and Imitrex, because flushing sensation and chest tightness. Episodes of migraine are described as a dull headache, followed by nausea and the pain intensifies and becomes throbbing or  pounding sensation in different parts of his head. It is associated with photophobia, phonophobia and movement - such as walking. He stopped taking amitriptyline since episodes decreased in frequency and asked to resume it today since having tension headaches a couple of times a week, and migraine headaches about 3 -4 times a month over the past couple of months . Explained elavil and trintelix not recommended, advised to increase fluid intake, resume walks daily and physical activity and resume depakote to improve sleep and  possible migraine and if no improvement we will try another therapy   Patient Active Problem List   Diagnosis Date Noted  . Severe episode of recurrent major depressive disorder, without psychotic features (HCC) 07/17/2018  . Headache, migraine 06/08/2015  . Obesity (BMI 30.0-34.9) 06/08/2015  . Allergic rhinitis, seasonal 07/26/2013    Past Surgical History:  Procedure Laterality Date  . RHINOPLASTY    . TONSILLECTOMY      Family History  Problem Relation Age of Onset  . Cancer Mother        Breast  . Hypertension Father   . Heart disease Father   . Heart attack Father   . Cancer Maternal Aunt        Breast  . Hyperlipidemia Maternal Grandmother   . Parkinson's disease Paternal Grandfather     Social History   Socioeconomic History  . Marital status: Married    Spouse name: Morrie Sheldonshley   . Number of children: 0  . Years of education: Not on file  . Highest education level: Bachelor's degree (e.g., BA, AB, BS)  Occupational History  . Occupation: Runner, broadcasting/film/videoteacher   Social Needs  . Financial resource strain: Somewhat hard  . Food insecurity    Worry: Never true    Inability: Never true  . Transportation needs    Medical: No    Non-medical: No  Tobacco Use  . Smoking status: Never Smoker  . Smokeless tobacco: Never Used  Substance and Sexual Activity  . Alcohol use: No  Alcohol/week: 0.0 standard drinks  . Drug use: No  . Sexual activity: Yes    Partners: Female  Lifestyle  . Physical activity    Days per week: 3 days    Minutes per session: 30 min  . Stress: Only a little  Relationships  . Social connections    Talks on phone: More than three times a week    Gets together: Twice a week    Attends religious service: More than 4 times per year    Active member of club or organization: Yes    Attends meetings of clubs or organizations: More than 4 times per year    Relationship status: Married  . Intimate partner violence    Fear of current or ex partner: No     Emotionally abused: No    Physically abused: No    Forced sexual activity: No  Other Topics Concern  . Not on file  Social History Narrative  . Not on file     Current Outpatient Medications:  .  divalproex (DEPAKOTE) 250 MG DR tablet, Take 1 tablet (250 mg total) by mouth every evening., Disp: 90 tablet, Rfl: 0 .  hydrOXYzine (ATARAX/VISTARIL) 10 MG tablet, Take 1 tablet (10 mg total) by mouth 3 (three) times daily as needed., Disp: 90 tablet, Rfl: 0 .  vortioxetine HBr (TRINTELLIX) 20 MG TABS tablet, Take 1 tablet (20 mg total) by mouth daily., Disp: 90 tablet, Rfl: 0  Allergies  Allergen Reactions  . Penicillins Rash    I personally reviewed active problem list, medication list, allergies, family history, social history, health maintenance with the patient/caregiver today.   ROS  Constitutional: Negative for fever or weight change.  Respiratory: Negative for cough and shortness of breath.   Cardiovascular: Negative for chest pain or palpitations.  Gastrointestinal: Negative for abdominal pain, no bowel changes.  Musculoskeletal: Negative for gait problem or joint swelling.  Skin: Negative for rash.  Neurological: Negative for dizziness , positive for intermittent  headache.  No other specific complaints in a complete review of systems (except as listed in HPI above).   Objective  Vitals:   02/08/19 1552  BP: 120/80  Pulse: 67  Resp: 16  Temp: (!) 96.9 F (36.1 C)  TempSrc: Temporal  SpO2: 99%  Weight: 247 lb 6.4 oz (112.2 kg)  Height: 5\' 10"  (1.778 m)    Body mass index is 35.5 kg/m.  Physical Exam  Constitutional: Patient appears well-developed and well-nourished. Obese  No distress.  HEENT: head atraumatic, normocephalic, pupils equal and reactive to light Cardiovascular: Normal rate, regular rhythm and normal heart sounds.  No murmur heard. No BLE edema. Pulmonary/Chest: Effort normal and breath sounds normal. No respiratory distress. Abdominal: Soft.   There is no tenderness. Psychiatric: Patient has a normal mood and affect. behavior is normal. Judgment and thought content normal.   PHQ2/9: Depression screen [facility] 2/9 02/08/2019 11/06/2018 09/27/2018 08/29/2018 07/30/2018  Decreased Interest 0 1 1 1 1   Down, Depressed, Hopeless 2 1 1 3 1   PHQ - 2 Score 2 2 2 4 2   Altered sleeping 2 2 0 1 3  Tired, decreased energy 2 1 1  0 3  Change in appetite 1 0 0 0 0  Feeling bad or failure about yourself  0 1 1 2  0  Trouble concentrating 0 0 1 0 0  Moving slowly or fidgety/restless 1 0 0 1 1  Suicidal thoughts 0 0 1 1 0  PHQ-9 Score 8 6 6  9  9  Difficult doing work/chores Somewhat difficult Somewhat difficult Very difficult Very difficult Somewhat difficult  Some recent data might be hidden    phq 9 is positive   Fall Risk: Fall Risk  02/08/2019 09/27/2018 08/29/2018 07/30/2018 07/17/2018  Falls in the past year? 0 0 0 0 0  Number falls in past yr: 0 0 0 - 0  Injury with Fall? 0 0 0 - 0     Functional Status Survey: Is the patient deaf or have difficulty hearing?: No Does the patient have difficulty seeing, even when wearing glasses/contacts?: No Does the patient have difficulty concentrating, remembering, or making decisions?: No Does the patient have difficulty walking or climbing stairs?: No Does the patient have difficulty dressing or bathing?: No Does the patient have difficulty doing errands alone such as visiting a doctor's office or shopping?: No    Assessment & Plan  1. GAD (generalized anxiety disorder)  - hydrOXYzine (ATARAX/VISTARIL) 10 MG tablet; Take 1 tablet (10 mg total) by mouth 3 (three) times daily as needed.  Dispense: 90 tablet; Refill: 0  2. Mild episode of recurrent major depressive disorder (HCC)  - divalproex (DEPAKOTE) 250 MG DR tablet; Take 1 tablet (250 mg total) by mouth every evening.  Dispense: 90 tablet; Refill: 0 - amphetamine-dextroamphetamine (ADDERALL XR) 20 MG 24 hr capsule; Take 1 capsule (20 mg total) by  mouth every morning.  Dispense: 30 capsule; Refill: 0 - amphetamine-dextroamphetamine (ADDERALL XR) 20 MG 24 hr capsule; Take 1 capsule (20 mg total) by mouth every morning.  Dispense: 30 capsule; Refill: 0 - amphetamine-dextroamphetamine (ADDERALL XR) 20 MG 24 hr capsule; Take 1 capsule (20 mg total) by mouth every morning.  Dispense: 30 capsule; Refill: 0  3. Migraine without aura and without status migrainosus, not intractable  Some recurrence recently but he has been off Depakote for about one month, we will resume medication  4. Psychophysiological insomnia  - hydrOXYzine (ATARAX/VISTARIL) 10 MG tablet; Take 1 tablet (10 mg total) by mouth 3 (three) times daily as needed.  Dispense: 90 tablet; Refill: 0

## 2019-04-10 ENCOUNTER — Encounter: Payer: Self-pay | Admitting: Family Medicine

## 2019-04-10 ENCOUNTER — Other Ambulatory Visit: Payer: Self-pay

## 2019-04-10 ENCOUNTER — Ambulatory Visit (INDEPENDENT_AMBULATORY_CARE_PROVIDER_SITE_OTHER): Payer: BC Managed Care – PPO | Admitting: Family Medicine

## 2019-04-10 DIAGNOSIS — F411 Generalized anxiety disorder: Secondary | ICD-10-CM

## 2019-04-10 DIAGNOSIS — F330 Major depressive disorder, recurrent, mild: Secondary | ICD-10-CM

## 2019-04-10 DIAGNOSIS — F5104 Psychophysiologic insomnia: Secondary | ICD-10-CM | POA: Diagnosis not present

## 2019-04-10 MED ORDER — AMPHETAMINE-DEXTROAMPHETAMINE 10 MG PO TABS: 10.0000 mg | ORAL_TABLET | Freq: Two times a day (BID) | ORAL | 0 refills | Status: DC

## 2019-04-10 MED ORDER — HYDROXYZINE HCL 10 MG PO TABS: 10.0000 mg | ORAL_TABLET | Freq: Three times a day (TID) | ORAL | 0 refills | Status: DC | PRN

## 2019-04-10 NOTE — Progress Notes (Signed)
Name: Gregory Matthews   MRN: [id number]    DOB: May 02, 1980   Date:04/10/2019       Progress Note  Subjective  Chief Complaint  Chief Complaint  Patient presents with  . Depression    I connected with  Gregory Matthews  on 04/10/19 at  2:20 PM EST by a video enabled telemedicine application and verified that I am speaking with the correct person using two identifiers.  I discussed the limitations of evaluation and management by telemedicine and the availability of in person appointments. The patient expressed understanding and agreed to proceed. Staff also discussed with the patient that there may be a patient responsible charge related to this service. Patient Location: at home  Provider Location: [facility]   HPI  MDD/GAD: he is getting marital counseling and is now going to see another therapist just for himself ( name of the therapist Gregory Matthews once a month for himself and about every 2 weeks as a couple ). His Phq 9is stable, he no longer has suicidal thoughts. Hestates mood swings improved with Depakote He is compliant with medication.. They sold their condo, moved to [place] for a short period of time and is now at the home they built . We gave him a rx of Adderal XR but not filled secondary to insurance denial, [facility]. He states Adderal really helps him, helps him from having down days and helps with energy but is causing him to have testicular pain about 4 hours after he takes medication. He states initial dose was not as bad, but with higher dose is happening more frequently. He has been skipping medication because of the pain . We will try immediate release 20 mg in am and 10 mg at around 12-2 pm   Patient Active Problem List   Diagnosis Date Noted  . Severe episode of recurrent major depressive disorder, without psychotic features (HCC) 07/17/2018  . Headache, migraine 06/08/2015  .  Obesity (BMI 30.0-34.9) 06/08/2015  . Allergic rhinitis, seasonal 07/26/2013    Past Surgical History:  Procedure Laterality Date  . RHINOPLASTY    . TONSILLECTOMY      Family History  Problem Relation Age of Onset  . Cancer Mother        Breast  . Hypertension Father   . Heart disease Father   . Heart attack Father   . Cancer Maternal Aunt        Breast  . Hyperlipidemia Maternal Grandmother   . Parkinson's disease Paternal Grandfather     Social History   Socioeconomic History  . Marital status: Married    Spouse name: Gregory Matthews   . Number of children: 0  . Years of education: Not on file  . Highest education level: Bachelor's degree (e.g., BA, AB, BS)  Occupational History  . Occupation: Runner, broadcasting/film/video   Tobacco Use  . Smoking status: Never Smoker  . Smokeless tobacco: Never Used  Substance and Sexual Activity  . Alcohol use: No    Alcohol/week: 0.0 standard drinks  . Drug use: No  . Sexual activity: Yes    Partners: Female  Other Topics Concern  . Not on file  Social History Narrative   Married, he is a Manufacturing engineer Strain: Low Risk   . Difficulty of Paying Living Expenses: Not very hard  Food Insecurity: No Food Insecurity  . Worried About Radiation protection practitioner  of Food in the Last Year: Never true  . Ran Out of Food in the Last Year: Never true  Transportation Needs: No Transportation Needs  . Lack of Transportation (Medical): No  . Lack of Transportation (Non-Medical): No  Physical Activity: Inactive  . Days of Exercise per Week: 0 days  . Minutes of Exercise per Session: 0 min  Stress: No Stress Concern Present  . Feeling of Stress : Only a little  Social Connections: Not Isolated  . Frequency of Communication with Friends and Family: More than three times a week  . Frequency of Social Gatherings with Friends and Family: More than three times a week  . Attends Religious Services: More than 4 times per year  . Active  Member of Clubs or Organizations: Yes  . Attends Archivist Meetings: More than 4 times per year  . Marital Status: Married  Human resources officer Violence: Not At Risk  . Fear of Current or Ex-Partner: No  . Emotionally Abused: No  . Physically Abused: No  . Sexually Abused: No     Current Outpatient Medications:  .  amphetamine-dextroamphetamine (ADDERALL) 10 MG tablet, Take 1-2 tablets (10-20 mg total) by mouth 2 (two) times daily. 20 mg in am and 10 mg in the pm, Disp: 90 tablet, Rfl: 0 .  divalproex (DEPAKOTE) 250 MG DR tablet, Take 1 tablet (250 mg total) by mouth every evening., Disp: 90 tablet, Rfl: 0 .  hydrOXYzine (ATARAX/VISTARIL) 10 MG tablet, Take 1 tablet (10 mg total) by mouth 3 (three) times daily as needed., Disp: 90 tablet, Rfl: 0 .  vortioxetine HBr (TRINTELLIX) 20 MG TABS tablet, Take 1 tablet (20 mg total) by mouth daily., Disp: 90 tablet, Rfl: 0  Allergies  Allergen Reactions  . Penicillins Rash    I personally reviewed active problem list, medication list, allergies, family history, social history, health maintenance with the patient/caregiver today.   ROS  Ten systems reviewed and is negative except as mentioned in HPI   Objective  Virtual encounter, vitals not obtained.  There is no height or weight on file to calculate BMI.  Physical Exam  Awake, alert and oriented   PHQ2/9: Depression screen [facility] 2/9 04/10/2019 02/08/2019 11/06/2018 09/27/2018 08/29/2018  Decreased Interest 1 0 1 1 1   Down, Depressed, Hopeless 1 2 1 1 3   PHQ - 2 Score 2 2 2 2 4   Altered sleeping 2 2 2  0 1  Tired, decreased energy 1 2 1 1  0  Change in appetite 0 1 0 0 0  Feeling bad or failure about yourself  1 0 1 1 2   Trouble concentrating 1 0 0 1 0  Moving slowly or fidgety/restless 0 0 0 0 1  Suicidal thoughts 0 0 0 1 1  PHQ-9 Score 7 7 6 6 9   Difficult doing work/chores Somewhat difficult Somewhat difficult Somewhat difficult Very difficult Very difficult  Some recent  data might be hidden   PHQ-2/9 Result is positive.    Fall Risk: Fall Risk  04/10/2019 02/08/2019 09/27/2018 08/29/2018 07/30/2018  Falls in the past year? 1 0 0 0 0  Number falls in past yr: 0 0 0 0 -  Injury with Fall? 0 0 0 0 -  Risk for fall due to : History of fall(s) - - - -     Assessment & Plan  1. Mild episode of recurrent major depressive disorder (HCC)  - amphetamine-dextroamphetamine (ADDERALL) 10 MG tablet; Take 1-2 tablets (10-20 mg total) by  mouth 2 (two) times daily. 20 mg in am and 10 mg in the pm  Dispense: 90 tablet; Refill: 0  2. Psychophysiological insomnia  - hydrOXYzine (ATARAX/VISTARIL) 10 MG tablet; Take 1 tablet (10 mg total) by mouth 3 (three) times daily as needed.  Dispense: 90 tablet; Refill: 0  3. GAD (generalized anxiety disorder)  - hydrOXYzine (ATARAX/VISTARIL) 10 MG tablet; Take 1 tablet (10 mg total) by mouth 3 (three) times daily as needed.  Dispense: 90 tablet; Refill: 0 I discussed the assessment and treatment plan with the patient. The patient was provided an opportunity to ask questions and all were answered. The patient agreed with the plan and demonstrated an understanding of the instructions.  The patient was advised to call back or seek an in-person evaluation if the symptoms worsen or if the condition fails to improve as anticipated.  I provided 15  minutes of non-face-to-face time during this encounter.

## 2019-05-02 ENCOUNTER — Other Ambulatory Visit: Payer: Self-pay | Admitting: Family Medicine

## 2019-05-02 DIAGNOSIS — F332 Major depressive disorder, recurrent severe without psychotic features: Secondary | ICD-10-CM

## 2019-05-02 MED ORDER — VORTIOXETINE HBR 20 MG PO TABS: 20.0000 mg | ORAL_TABLET | Freq: Every day | ORAL | 0 refills | Status: DC

## 2019-05-02 NOTE — Telephone Encounter (Signed)
Pt needs refill on Trintellix to be sent to [facility]. Pt will be out by the weekend.

## 2019-05-15 ENCOUNTER — Ambulatory Visit (INDEPENDENT_AMBULATORY_CARE_PROVIDER_SITE_OTHER): Payer: BC Managed Care – PPO | Admitting: Family Medicine

## 2019-05-15 ENCOUNTER — Encounter: Payer: Self-pay | Admitting: Family Medicine

## 2019-05-15 ENCOUNTER — Other Ambulatory Visit: Payer: Self-pay

## 2019-05-15 VITALS — Temp 98.0°F | Ht 70.0 in

## 2019-05-15 DIAGNOSIS — G43009 Migraine without aura, not intractable, without status migrainosus: Secondary | ICD-10-CM | POA: Diagnosis not present

## 2019-05-15 DIAGNOSIS — F330 Major depressive disorder, recurrent, mild: Secondary | ICD-10-CM | POA: Diagnosis not present

## 2019-05-15 DIAGNOSIS — F5104 Psychophysiologic insomnia: Secondary | ICD-10-CM | POA: Diagnosis not present

## 2019-05-15 DIAGNOSIS — F411 Generalized anxiety disorder: Secondary | ICD-10-CM | POA: Diagnosis not present

## 2019-05-15 MED ORDER — AMITRIPTYLINE HCL 10 MG PO TABS: 10.0000 mg | ORAL_TABLET | Freq: Every day | ORAL | 0 refills | Status: DC

## 2019-05-15 MED ORDER — AMPHETAMINE-DEXTROAMPHETAMINE 10 MG PO TABS: 10.0000 mg | ORAL_TABLET | Freq: Two times a day (BID) | ORAL | 0 refills | Status: DC

## 2019-05-15 MED ORDER — DIVALPROEX SODIUM 250 MG PO DR TAB: 250.0000 mg | DELAYED_RELEASE_TABLET | Freq: Every evening | ORAL | 0 refills | Status: DC

## 2019-05-15 NOTE — Progress Notes (Signed)
Name: Gregory Matthews   MRN: [id number]    DOB: [birth year]   Date:05/15/2019       Progress Note  Subjective  Chief Complaint  Chief Complaint  Patient presents with  . Follow-up  . Depression  . GAD  . Psychophysiological insomnia    hydrOXYzine is helping him with sleeping on average 6-8 hours nightly. A little trouble with falling asleep but doing well with staying asleep    I connected with  Gregory Matthews on 05/15/19 at  3:00 PM EST by telephone and verified that I am speaking with the correct person using two identifiers.  I discussed the limitations, risks, security and privacy concerns of performing an evaluation and management service by telephone and the availability of in person appointments. Staff also discussed with the patient that there may be a patient responsible charge related to this service. Patient Location: at home Provider Location: [facility]   HPI  MDD/GAD: he is getting marital counseling and is now going to see another therapist just for himself ( name of the therapist Gregory Matthews once a month for himself and about every 2 weeks as a couple ). His Phq 9is stable, he no longer has suicidal thoughts. Hestates mood swings improved with Depakote He is compliant with medication.They moved to a bigger house in the [place].  He states Adderal really helps him, helps him from having down days and helps with energy but is causing him to have testicular pain about 4 hours after he takes medication. He called in Dec because he was testicular pain about 4 hours after taking Adderal XR, we changed dose to 10 mg immediate release tables and he states taking one bid is working well for him    Insomnia: he is doing well on hydroxyzine, sleeping 6-8 hours , he is waking up feeling rested  Migraine:We have tried Topamax and Imitrex. He has stopped both medication secondary to side effects. Topamax he stopped  because of change in taste and Imitrex, because flushing sensation and chest tightness. Episodes of migraine are described as a dull headache, followed by nausea andthe pain intensifies and becomes throbbingorpounding sensation in different parts of his head. It is associated with photophobia, phonophobia and movement - such as walking. Hestopped taking amitriptyline since episodes decreased in frequency but is asking to resume it  . Explained possible drug to drug interaction with Trintelix and elavil, however he would like to resume medication since episodes of migraine have been increasing and is getting 3-4 times a week again. He states symptoms tends to improve with a better diet and also increase in exercise  Patient Active Problem List   Diagnosis Date Noted  . Severe episode of recurrent major depressive disorder, without psychotic features (HCC) 07/17/2018  . Headache, migraine 06/08/2015  . Obesity (BMI 30.0-34.9) 06/08/2015  . Allergic rhinitis, seasonal 07/26/2013    Past Surgical History:  Procedure Laterality Date  . RHINOPLASTY    . TONSILLECTOMY      Family History  Problem Relation Age of Onset  . Cancer Mother        Breast  . Hypertension Father   . Heart disease Father   . Heart attack Father   . Cancer Maternal Aunt        Breast  . Hyperlipidemia Maternal Grandmother   . Parkinson's disease Paternal Grandfather     Social History   Socioeconomic History  . Marital status: Married    Spouse name:  Gregory Matthews   . Number of children: 0  . Years of education: Not on file  . Highest education level: Bachelor's degree (e.g., BA, AB, BS)  Occupational History  . Occupation: Runner, broadcasting/film/video   Tobacco Use  . Smoking status: Never Smoker  . Smokeless tobacco: Never Used  Substance and Sexual Activity  . Alcohol use: No    Alcohol/week: 0.0 standard drinks  . Drug use: No  . Sexual activity: Yes    Partners: Female  Other Topics Concern  . Not on file  Social  History Narrative   Married, he is a Manufacturing engineer Strain: Low Risk   . Difficulty of Paying Living Expenses: Not very hard  Food Insecurity: No Food Insecurity  . Worried About Programme researcher, broadcasting/film/video in the Last Year: Never true  . Ran Out of Food in the Last Year: Never true  Transportation Needs: No Transportation Needs  . Lack of Transportation (Medical): No  . Lack of Transportation (Non-Medical): No  Physical Activity: Inactive  . Days of Exercise per Week: 0 days  . Minutes of Exercise per Session: 0 min  Stress: No Stress Concern Present  . Feeling of Stress : Only a little  Social Connections: Not Isolated  . Frequency of Communication with Friends and Family: More than three times a week  . Frequency of Social Gatherings with Friends and Family: More than three times a week  . Attends Religious Services: More than 4 times per year  . Active Member of Clubs or Organizations: Yes  . Attends Banker Meetings: More than 4 times per year  . Marital Status: Married  Catering manager Violence: Not At Risk  . Fear of Current or Ex-Partner: No  . Emotionally Abused: No  . Physically Abused: No  . Sexually Abused: No     Current Outpatient Medications:  .  amphetamine-dextroamphetamine (ADDERALL) 10 MG tablet, Take 1-2 tablets (10-20 mg total) by mouth 2 (two) times daily. 20 mg in am and 10 mg in the pm (Patient taking differently: Take 10-20 mg by mouth 2 (two) times daily. 10 mg in am and 10 mg in the pm), Disp: 90 tablet, Rfl: 0 .  divalproex (DEPAKOTE) 250 MG DR tablet, Take 1 tablet (250 mg total) by mouth every evening., Disp: 90 tablet, Rfl: 0 .  hydrOXYzine (ATARAX/VISTARIL) 10 MG tablet, Take 1 tablet (10 mg total) by mouth 3 (three) times daily as needed., Disp: 90 tablet, Rfl: 0 .  vortioxetine HBr (TRINTELLIX) 20 MG TABS tablet, Take 1 tablet (20 mg total) by mouth daily., Disp: 90 tablet, Rfl: 0  Allergies   Allergen Reactions  . Penicillins Rash    I personally reviewed active problem list, medication list, allergies, family history, social history with the patient/caregiver today.   ROS  Ten systems reviewed and is negative except as mentioned in HPI   Objective  Virtual encounter, vitals not obtained.  Body mass index is 35.5 kg/m.  Physical Exam  Awake, alert and oriented   PHQ2/9: Depression screen [facility] 2/9 05/15/2019 04/10/2019 02/08/2019 11/06/2018 09/27/2018  Decreased Interest 1 1 0 1 1  Down, Depressed, Hopeless 1 1 2 1 1   PHQ - 2 Score 2 2 2 2 2   Altered sleeping 1 2 2 2  0  Tired, decreased energy 1 1 2 1 1   Change in appetite 0 0 1 0 0  Feeling bad or failure about yourself  1 1  0 1 1  Trouble concentrating 1 1 0 0 1  Moving slowly or fidgety/restless 1 0 0 0 0  Suicidal thoughts 0 0 0 0 1  PHQ-9 Score 7 7 7 6 6   Difficult doing work/chores Somewhat difficult Somewhat difficult Somewhat difficult Somewhat difficult Very difficult  Some recent data might be hidden   PHQ-2/9 Result is positive.    Fall Risk: Fall Risk  05/15/2019 04/10/2019 02/08/2019 09/27/2018 08/29/2018  Falls in the past year? 1 1 0 0 0  Number falls in past yr: 0 0 0 0 0  Injury with Fall? 0 0 0 0 0  Risk for fall due to : History of fall(s) History of fall(s) - - -     Assessment & Plan  1. Mild episode of recurrent major depressive disorder (HCC)  - divalproex (DEPAKOTE) 250 MG DR tablet; Take 1 tablet (250 mg total) by mouth every evening.  Dispense: 90 tablet; Refill: 0 - amphetamine-dextroamphetamine (ADDERALL) 10 MG tablet; Take 1 tablet (10 mg total) by mouth 2 (two) times daily.  Dispense: 60 tablet; Refill: 0 - amphetamine-dextroamphetamine (ADDERALL) 10 MG tablet; Take 1 tablet (10 mg total) by mouth 2 (two) times daily.  Dispense: 60 tablet; Refill: 0 - amphetamine-dextroamphetamine (ADDERALL) 10 MG tablet; Take 1 tablet (10 mg total) by mouth 2 (two) times daily.  Dispense: 60  tablet; Refill: 0  2. GAD (generalized anxiety disorder)   3. Migraine without aura and without status migrainosus, not intractable  - divalproex (DEPAKOTE) 250 MG DR tablet; Take 1 tablet (250 mg total) by mouth every evening.  Dispense: 90 tablet; Refill: 0 - amitriptyline (ELAVIL) 10 MG tablet; Take 1 tablet (10 mg total) by mouth at bedtime.  Dispense: 90 tablet; Refill: 0  4. Psychophysiological insomnia   I discussed the assessment and treatment plan with the patient. The patient was provided an opportunity to ask questions and all were answered. The patient agreed with the plan and demonstrated an understanding of the instructions.   The patient was advised to call back or seek an in-person evaluation if the symptoms worsen or if the condition fails to improve as anticipated.  I provided 25  minutes of non-face-to-face time during this encounter.  Loistine Chance, MD

## 2019-06-22 ENCOUNTER — Other Ambulatory Visit: Payer: Self-pay

## 2019-06-22 ENCOUNTER — Ambulatory Visit: Payer: BC Managed Care – PPO | Attending: Internal Medicine

## 2019-06-22 DIAGNOSIS — Z23 Encounter for immunization: Secondary | ICD-10-CM

## 2019-06-22 NOTE — Progress Notes (Signed)
   Covid-19 Vaccination Clinic  Name:  Gregory Matthews    MRN: [id number] DOB: 06/08/1980  06/22/2019  Mr. Ratajczak was observed post Covid-19 immunization for 15 minutes without incidence. He was provided with Vaccine Information Sheet and instruction to access the V-Safe system.   Mr. Nevares was instructed to call 911 with any severe reactions post vaccine: Marland Kitchen Difficulty breathing  . Swelling of your face and throat  . A fast heartbeat  . A bad rash all over your body  . Dizziness and weakness    Immunizations Administered    Name Date Dose VIS Date Route   Moderna COVID-19 Vaccine 06/22/2019  1:06 PM 0.5 mL 03/26/2019 Intramuscular   Manufacturer: Moderna   Lot: [id number]   NDC: 75102-585-27

## 2019-06-27 ENCOUNTER — Ambulatory Visit: Payer: BC Managed Care – PPO | Admitting: Internal Medicine

## 2019-06-27 ENCOUNTER — Other Ambulatory Visit: Payer: Self-pay

## 2019-06-27 ENCOUNTER — Encounter: Payer: Self-pay | Admitting: Internal Medicine

## 2019-06-27 VITALS — BP 128/84 | HR 101 | Temp 97.9°F | Resp 20 | Ht 70.0 in | Wt 254.7 lb

## 2019-06-27 DIAGNOSIS — E6609 Other obesity due to excess calories: Secondary | ICD-10-CM | POA: Diagnosis not present

## 2019-06-27 DIAGNOSIS — M545 Low back pain, unspecified: Secondary | ICD-10-CM

## 2019-06-27 DIAGNOSIS — Z6835 Body mass index (BMI) 35.0-35.9, adult: Secondary | ICD-10-CM | POA: Diagnosis not present

## 2019-06-27 DIAGNOSIS — M62830 Muscle spasm of back: Secondary | ICD-10-CM

## 2019-06-27 MED ORDER — NAPROXEN SODIUM 550 MG PO TABS: 550.0000 mg | ORAL_TABLET | Freq: Two times a day (BID) | ORAL | 1 refills | Status: DC | PRN

## 2019-06-27 MED ORDER — KETOROLAC TROMETHAMINE 60 MG/2ML IM SOLN
60.0000 mg | Freq: Once | INTRAMUSCULAR | Status: AC
  Administered 2019-06-27: 60 mg via INTRAMUSCULAR

## 2019-06-27 MED ORDER — CYCLOBENZAPRINE HCL 10 MG PO TABS: 10.0000 mg | ORAL_TABLET | Freq: Three times a day (TID) | ORAL | 1 refills | Status: DC | PRN

## 2019-06-27 NOTE — Progress Notes (Signed)
Patient ID: Gregory Matthews, male    DOB: July 03, 1980, 39 y.o.   MRN: [id number]  PCP: Gregory Cory, MD  Chief Complaint  Patient presents with  . Back Pain    Right side, woke up quickly and hurt back    Subjective:   Gregory Matthews is a 39 y.o. male, presents to clinic with CC of the following:  Chief Complaint  Patient presents with  . Back Pain    Right side, woke up quickly and hurt back    HPI:  Patient notes his back has been sore on and off for the past couple weeks, and last night, jolted awake in the middle of the night and had severe pain in the right lower back.  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 Notes pain a 7-8 /10 on the pain scale at its worst Pain felt in lower back right of the spine in the muscle region.  Some pain at rest and significantly increased with movements No pain radiates down the legs No numbness, tingling or marked weakness in LE's, no foot drop No bowel or bladder dysfunction symptoms, no saddle anesthesia symptoms No fevers or other infectious symptoms of concern   Has a h/o occas low back pain in the past, but this is much more painful than anything previous. No h/o major prior back injury/fracture/surgery No cancer history No diabetes history No history of stomach ulcers or concerns with taking anti-inflammatory medicines in his past. Is obese  Never smoker, no alcohol use, is a Runner, broadcasting/film/video Had the first dose of the covid vaccine  Patient Active Problem List   Diagnosis Date Noted  . Severe episode of recurrent major depressive disorder, without psychotic features (HCC) 07/17/2018  . Headache, migraine 06/08/2015  . Obesity (BMI 30.0-34.9) 06/08/2015  . Allergic rhinitis, seasonal 07/26/2013      Current Outpatient Medications:  .  amitriptyline (ELAVIL) 10 MG tablet, Take 1 tablet (10 mg total) by mouth at  bedtime., Disp: 90 tablet, Rfl: 0 .  amphetamine-dextroamphetamine (ADDERALL) 10 MG tablet, Take 1 tablet (10 mg total) by mouth 2 (two) times daily., Disp: 60 tablet, Rfl: 0 .  amphetamine-dextroamphetamine (ADDERALL) 10 MG tablet, Take 1 tablet (10 mg total) by mouth 2 (two) times daily., Disp: 60 tablet, Rfl: 0 .  amphetamine-dextroamphetamine (ADDERALL) 10 MG tablet, Take 1 tablet (10 mg total) by mouth 2 (two) times daily., Disp: 60 tablet, Rfl: 0 .  divalproex (DEPAKOTE) 250 MG DR tablet, Take 1 tablet (250 mg total) by mouth every evening., Disp: 90 tablet, Rfl: 0 .  hydrOXYzine (ATARAX/VISTARIL) 10 MG tablet, Take 1 tablet (10 mg total) by mouth 3 (three) times daily as needed., Disp: 90 tablet, Rfl: 0 .  vortioxetine HBr (TRINTELLIX) 20 MG TABS tablet, Take 1 tablet (20 mg total) by mouth daily., Disp: 90 tablet, Rfl: 0 .  cyclobenzaprine (FLEXERIL) 10 MG tablet, Take 1 tablet (10 mg total) by mouth 3 (three) times daily as needed for muscle spasms. Take mainly at bedtime as may make drowsy. Not drive if taking, Disp: 30 tablet, Rfl: 1 .  naproxen sodium (ANAPROX DS) 550 MG tablet, Take 1 tablet (550 mg total) by mouth 2 (two) times daily as needed. Take with meals, Disp: 20 tablet, Rfl: 1   Allergies  Allergen Reactions  . Penicillins Rash     Past Surgical  History:  Procedure Laterality Date  . RHINOPLASTY    . TONSILLECTOMY       Family History  Problem Relation Age of Onset  . Cancer Mother        Breast  . Hypertension Father   . Heart disease Father   . Heart attack Father   . Cancer Maternal Aunt        Breast  . Hyperlipidemia Maternal Grandmother   . Parkinson's disease Paternal Grandfather      Social History   Tobacco Use  . Smoking status: Never Smoker  . Smokeless tobacco: Never Used  Substance Use Topics  . Alcohol use: No    Alcohol/week: 0.0 standard drinks    With staff assistance, above reviewed with the patient today.  ROS: As per HPI,  otherwise no specific complaints on a limited and focused system review   No results found for this or any previous visit (from the past 72 hour(s)).   PHQ2/9: Depression screen [facility] 2/9 06/27/2019 05/15/2019 04/10/2019 02/08/2019 11/06/2018  Decreased Interest 0 1 1 0 1  Down, Depressed, Hopeless 0 1 1 2 1   PHQ - 2 Score 0 2 2 2 2   Altered sleeping 1 1 2 2 2   Tired, decreased energy 1 1 1 2 1   Change in appetite 0 0 0 1 0  Feeling bad or failure about yourself  1 1 1  0 1  Trouble concentrating 1 1 1  0 0  Moving slowly or fidgety/restless 0 1 0 0 0  Suicidal thoughts 0 0 0 0 0  PHQ-9 Score 4 7 7 7 6   Difficult doing work/chores Somewhat difficult Somewhat difficult Somewhat difficult Somewhat difficult Somewhat difficult  Some recent data might be hidden   PHQ-2/9 Result is neg for depression, phq2 better from prior  Fall Risk: Fall Risk  06/27/2019 05/15/2019 04/10/2019 02/08/2019 09/27/2018  Falls in the past year? 0 1 1 0 0  Number falls in past yr: 0 0 0 0 0  Injury with Fall? 0 0 0 0 0  Risk for fall due to : - History of fall(s) History of fall(s) - -      Objective:   Vitals:   06/27/19 1420  BP: 128/84  Pulse: (!) 101  Resp: 20  Temp: 97.9 F (36.6 C)  TempSrc: Temporal  SpO2: 97%  Weight: 254 lb 11.2 oz (115.5 kg)  Height: 5\' 10"  (1.778 m)    Body mass index is 36.55 kg/m.  Physical Exam   NAD, masked, obese HEENT - [place]/AT, sclera anicteric, Neck - supple,  Car - RRR  Abd - soft,obese, diffusely guarding some with palpation, notes ticklish, no markedly painful with palpation Back - no CVA tenderness   Limited ROM due to pain  Could get to a supine position slowly and with assistance Painful with palpation in lower back, adjacent to and lateral to lumbar spine on the right in the lower lumbar muscle region  NT over spine proper, mild spasm  No focal CVA tenderness No focal SI joint tenderness No bruising or swelling             SLR negative, with some pain  in lower back noted with testing the right, not the left Good strength in LE's on testing including with dorsi and plantar flexion of feet bilateral             Sensation intact to LT in bilteral LE's distal  DTR's 2+ and = in patella and achilles Ext - no LE edema,  Neuro/psychiatric - affect was not flat, appropriate with conversation  Alert and oriented  Speech  normal       Assessment & Plan:   1. Acute right-sided low back pain without sciatica Indicated, more of a mechanical or muscle based source to his pain. No concerning red flag symptoms, and not feel x-rays indicated presently Given his discomfort, discussed the shot of Toradol which can be more helpful acutely, and he was okay proceeding with this.  60 mg IM of Toradol was given in the office.  He was observed for 25-30 minutes, and felt a little better on reassessment after. Not to take the naproxen product until tomorrow morning at the earliest, and can use twice a day and emphasized taking with food.  He has no history of ulcer concerns, and noted with one of his medications, a slight increase concern with this side effect, and will use the naproxen only in the short-term and emphasized if he develops any epigastric pains, dark black stools, he needs to stop the medication and contact us.  He was understanding of that.  Also recommended taking the Flexeril mostly at bedtime, as may make drowsy during the day.  Not to take during the day if driving or if going to work. Discussed contrast therapies including warmth, then range of motion exercises which were reviewed, then applying cold after to keep the inflammation down. - cyclobenzaprine (FLEXERIL) 10 MG tablet; Take 1 tablet (10 mg total) by mouth 3 (three) times daily as needed for muscle spasms. Take mainly at bedtime as may make drowsy. Not drive if taking  Dispense: 30 tablet; Refill: 1 - naproxen sodium (ANAPROX DS) 550 MG tablet; Take 1 tablet (550 mg total) by  mouth 2 (two) times daily as needed. Take with meals  Dispense: 20 tablet; Refill: 1 - ketorolac (TORADOL) injection 60 mg  2. Spasm of muscle of lower back As above  - cyclobenzaprine (FLEXERIL) 10 MG tablet; Take 1 tablet (10 mg total) by mouth 3 (three) times daily as needed for muscle spasms. Take mainly at bedtime as may make drowsy. Not drive if taking  Dispense: 30 tablet; Refill: 1 - naproxen sodium (ANAPROX DS) 550 MG tablet; Take 1 tablet (550 mg total) by mouth 2 (two) times daily as needed. Take with meals  Dispense: 20 tablet; Refill: 1 - ketorolac (TORADOL) injection 60 mg  3. Class 2 obesity due to excess calories without serious comorbidity with body mass index (BMI) of 35.0 to 35.9 in adult  Emphasized to follow-up if not improving or worsening over time        Towanda Malkin, MD 06/27/19 2:50 PM

## 2019-07-05 ENCOUNTER — Ambulatory Visit (INDEPENDENT_AMBULATORY_CARE_PROVIDER_SITE_OTHER): Payer: BC Managed Care – PPO | Admitting: Internal Medicine

## 2019-07-05 ENCOUNTER — Other Ambulatory Visit: Payer: Self-pay

## 2019-07-05 ENCOUNTER — Encounter: Payer: Self-pay | Admitting: Internal Medicine

## 2019-07-05 VITALS — BP 118/74 | HR 93 | Temp 97.9°F | Resp 16 | Ht 70.0 in | Wt 252.8 lb

## 2019-07-05 DIAGNOSIS — M5459 Other low back pain: Secondary | ICD-10-CM

## 2019-07-05 DIAGNOSIS — M25561 Pain in right knee: Secondary | ICD-10-CM

## 2019-07-05 DIAGNOSIS — M545 Low back pain: Secondary | ICD-10-CM | POA: Diagnosis not present

## 2019-07-05 NOTE — Progress Notes (Signed)
Patient ID: Gregory Matthews, male    DOB: 1981-01-05, 39 y.o.   MRN: [id number]  PCP: Alba Cory, MD  Chief Complaint  Patient presents with  . Knee Pain    right knee  . Back Pain    Subjective:   Gregory Matthews is a 39 y.o. male, presents to clinic with CC of the following:  Chief Complaint  Patient presents with  . Knee Pain    right knee  . Back Pain    HPI:  Patient is a 39 year old male whom I just saw last week with an acute low back pain episode without sciatica.  He notes that his back pain is better, but not resolved.  He is moving around better.  He returns today for evaluation of knee pain.  He is not sure if the back pain prompted a gait change to increase his knee pain.  He notes he feels it in the front of his knee, and has increased some in the past few days.  He works as a Runner, broadcasting/film/video, and has 3 flights of concrete steps to climb the teacher on the third floor.  He also notes after sitting for prolonged periods of time, when he gets up it is more painful.  Has not swollen significantly Has been intermittently bothersome in the past, more after long drives after sitting for a while. No one time trauma preceded the onset Did not try ice  Not locking or giving out  No h/o major knee injury in his past, noted when he was in kindergarten, he had a knee injury, may have involved a ligament, although he was not certain.  He limped around for a while and it slowly improved. No h/o knee surgeries  No cancer history No diabetes history No history of stomach ulcers or concerns with taking anti-inflammatory medicines in his past, and he has tolerated the naproxen which was prescribed for his back pain to date.  He is taking that twice a day. Is obese  Never smoker, no alcohol use, is a Runner, broadcasting/film/video Had the first dose of the covid vaccine, second scheduled for late March.  Patient Active Problem List   Diagnosis Date Noted  . Severe episode of recurrent major  depressive disorder, without psychotic features (HCC) 07/17/2018  . Headache, migraine 06/08/2015  . Obesity (BMI 30.0-34.9) 06/08/2015  . Allergic rhinitis, seasonal 07/26/2013      Current Outpatient Medications:  .  amitriptyline (ELAVIL) 10 MG tablet, Take 1 tablet (10 mg total) by mouth at bedtime., Disp: 90 tablet, Rfl: 0 .  amphetamine-dextroamphetamine (ADDERALL) 10 MG tablet, Take 1 tablet (10 mg total) by mouth 2 (two) times daily., Disp: 60 tablet, Rfl: 0 .  amphetamine-dextroamphetamine (ADDERALL) 10 MG tablet, Take 1 tablet (10 mg total) by mouth 2 (two) times daily., Disp: 60 tablet, Rfl: 0 .  amphetamine-dextroamphetamine (ADDERALL) 10 MG tablet, Take 1 tablet (10 mg total) by mouth 2 (two) times daily., Disp: 60 tablet, Rfl: 0 .  cyclobenzaprine (FLEXERIL) 10 MG tablet, Take 1 tablet (10 mg total) by mouth 3 (three) times daily as needed for muscle spasms. Take mainly at bedtime as may make drowsy. Not drive if taking, Disp: 30 tablet, Rfl: 1 .  divalproex (DEPAKOTE) 250 MG DR tablet, Take 1 tablet (250 mg total) by mouth every evening., Disp: 90 tablet, Rfl: 0 .  hydrOXYzine (ATARAX/VISTARIL) 10 MG tablet, Take 1 tablet (10 mg total) by mouth 3 (three) times daily as needed.,  Disp: 90 tablet, Rfl: 0 .  naproxen sodium (ANAPROX DS) 550 MG tablet, Take 1 tablet (550 mg total) by mouth 2 (two) times daily as needed. Take with meals, Disp: 20 tablet, Rfl: 1 .  vortioxetine HBr (TRINTELLIX) 20 MG TABS tablet, Take 1 tablet (20 mg total) by mouth daily., Disp: 90 tablet, Rfl: 0   Allergies  Allergen Reactions  . Penicillins Rash     Past Surgical History:  Procedure Laterality Date  . RHINOPLASTY    . TONSILLECTOMY       Family History  Problem Relation Age of Onset  . Cancer Mother        Breast  . Hypertension Father   . Heart disease Father   . Heart attack Father   . Cancer Maternal Aunt        Breast  . Hyperlipidemia Maternal Grandmother   . Parkinson's  disease Paternal Grandfather      Social History   Tobacco Use  . Smoking status: Never Smoker  . Smokeless tobacco: Never Used  Substance Use Topics  . Alcohol use: No    Alcohol/week: 0.0 standard drinks    With staff assistance, above reviewed with the patient today.  ROS: As per HPI, otherwise no specific complaints on a limited and focused system review   No results found for this or any previous visit (from the past 72 hour(s)).   PHQ2/9: Depression screen [facility] 2/9 07/05/2019 06/27/2019 05/15/2019 04/10/2019 02/08/2019  Decreased Interest 0 0 1 1 0  Down, Depressed, Hopeless 0 0 1 1 2   PHQ - 2 Score 0 0 2 2 2   Altered sleeping 2 1 1 2 2   Tired, decreased energy 0 1 1 1 2   Change in appetite 0 0 0 0 1  Feeling bad or failure about yourself  0 1 1 1  0  Trouble concentrating 0 1 1 1  0  Moving slowly or fidgety/restless 0 0 1 0 0  Suicidal thoughts 0 0 0 0 0  PHQ-9 Score 2 4 7 7 7   Difficult doing work/chores Not difficult at all Somewhat difficult Somewhat difficult Somewhat difficult Somewhat difficult  Some recent data might be hidden   PHQ-2/9 Result is neg  Fall Risk: Fall Risk  07/05/2019 06/27/2019 05/15/2019 04/10/2019 02/08/2019  Falls in the past year? 0 0 1 1 0  Number falls in past yr: 0 0 0 0 0  Injury with Fall? 0 0 0 0 0  Risk for fall due to : - - History of fall(s) History of fall(s) -      Objective:   Vitals:   07/05/19 1410  BP: 118/74  Pulse: 93  Resp: 16  Temp: 97.9 F (36.6 C)  TempSrc: Temporal  SpO2: 98%  Weight: 252 lb 12.8 oz (114.7 kg)  Height: 5\' 10"  (1.778 m)    Body mass index is 36.27 kg/m.  Physical Exam   NAD, masked HEENT - [place]/AT, sclera anicteric,  Abd - soft, NT, ND, BS+,  no masses Back - no CVA tenderness, no spinal tenderness, nontender to palpate in the muscle beds in the lower lumbar region where he still feels the discomfort at times bilateral. Ext - no LE edema, Examined uninjured knee first and stable,    Right Knee - Good ROM, no limitations, no discomfort in flexion, could fully extend  Mild crepitus on ROM testing  No effusion  No bruising  NT with movement of the patella, no gross deformity of patella  NT suprapatella bursa region  No marked medial joint line tenderness, mild lateral joint line tenderness with palpation  Med and lat collaterals intact on testing with no gap, no pain with testing  Lachman neg  And and post drawer neg  McMurrays neg  No pain posterior, no obvious Baker's cyst Neuro/psychiatric - affect was not flat, appropriate with conversation  Alert and oriented  Grossly non-focal - good strength on testing lower extremities, including with dorsi and plantar flexion of the feet.  Sensation intact to LT in distal lower extremities        Assessment & Plan:    1. Knee pain, right anterior  R KNEE PAIN-PF component, Knee stable on exam today, also feel meniscus without a big tear concern.  Educated at length on this with his classic movie-goer component to the history.  Also included information in the AVS  Rec'ed liberal use of ice short term  Activity modifications short-term, noting ones that may make things worse over time.  The naproxen he is taking presently for his back will help with the knee, and recommended being off the high-dose naproxen very shortly, and returning to just over-the-counter strength naproxen (aleve) up to 2 tablets twice daily with food only as needed  Reviewed the PFS exercises to do  (wall squats, towel between the knees, and terminal extensions) and recommended doing routinely and regularly.  Can do in addition to the back exercises more routinely.  He also noted he is very flat-footed, and recommended shoe inserts, and showed him ones that he can use to help.  Over-pronating with being flat-footed exacerbates the symptoms.  F/u if not improving or worsening, may need further imaging, PT or ortho input pending status over this time.     2. Acute mechanical low back pain with duration of less than six weeks His back pain is improving, did emphasize trying to get more active with the exercises which he has not been doing to date.  Also the contrast therapies reviewed, and moving away from the naproxen higher strength to lower strength and then eventually stopping as completely resolved.  Also will be lessening any Flexeril use at night over time. If does not continue to improve or again worsens, will follow up.  May need imaging or PT help at that point.        Jamelle Haring, MD 07/05/19 2:44 PM

## 2019-07-05 NOTE — Patient Instructions (Signed)
Patellofemoral Pain Syndrome  Patellofemoral pain syndrome is a condition in which the tissue (cartilage) on the underside of the kneecap (patella) softens or breaks down. This causes pain in the front of the knee. The condition is also called runner's knee or chondromalacia patella. Patellofemoral pain syndrome is most common in young adults who are active in sports. The knee is the largest joint in the body. The patella covers the front of the knee and is attached to muscles above and below the knee. The underside of the patella is covered with a smooth type of cartilage (synovium). The smooth surface helps the patella to glide easily when you move your knee. Patellofemoral pain syndrome causes swelling in the joint linings and bone surfaces in the knee. What are the causes? This condition may be caused by:  Overuse of the knee.  Poor alignment of your knee joints.  Weak leg muscles.  A direct blow to your kneecap. What increases the risk? You are more likely to develop this condition if:  You do a lot of activities that can wear down your kneecap. These include: ? Running. ? Squatting. ? Climbing stairs.  You start a new physical activity or exercise program.  You wear shoes that do not fit well.  You do not have good leg strength.  You are overweight. What are the signs or symptoms? The main symptom of this condition is knee pain. This may feel like a dull, aching pain underneath your patella, in the front of your knee. There may be a popping or cracking sound when you move your knee. Pain may get worse with:  Exercise.  Climbing stairs.  Running.  Jumping.  Squatting.  Kneeling.  Sitting for a long time.  Moving or pushing on your patella. How is this diagnosed? This condition may be diagnosed based on:  Your symptoms and medical history. You may be asked about your recent physical activities and which ones cause knee pain.  A physical exam. This may  include: ? Moving your patella back and forth. ? Checking your range of knee motion. ? Having you squat or jump to see if you have pain. ? Checking the strength of your leg muscles.  Imaging tests to confirm the diagnosis. These may include an MRI of your knee. How is this treated? This condition may be treated at home with rest, ice, compression, and elevation (RICE).  Other treatments may include:  Nonsteroidal anti-inflammatory drugs (NSAIDs).  Physical therapy to stretch and strengthen your leg muscles.  Shoe inserts (orthotics) to take stress off your knee.  A knee brace or knee support.  Adhesive tapes to the skin.  Surgery to remove damaged cartilage or move the patella to a better position. This is rare. Follow these instructions at home: If you have a shoe or brace:  Wear the shoe or brace as told by your health care provider. Remove it only as told by your health care provider.  Loosen the shoe or brace if your toes tingle, become numb, or turn cold and blue.  Keep the shoe or brace clean.  If the shoe or brace is not waterproof: ? Do not let it get wet. ? Cover it with a watertight covering when you take a bath or a shower. Managing pain, stiffness, and swelling  If directed, put ice on the painful area. ? If you have a removable shoe or brace, remove it as told by your health care provider. ? Put ice in a plastic bag. ?   Place a towel between your skin and the bag. ? Leave the ice on for 20 minutes, 2-3 times a day.  Move your toes often to avoid stiffness and to lessen swelling.  Rest your knee: ? Avoid activities that cause knee pain. ? When sitting or lying down, raise (elevate) the injured area above the level of your heart, whenever possible. General instructions  Take over-the-counter and prescription medicines only as told by your health care provider.  Use splints, braces, knee supports, or walking aids as directed by your health care  provider.  Perform stretching and strengthening exercises as told by your health care provider or physical therapist.  Do not use any products that contain nicotine or tobacco, such as cigarettes and e-cigarettes. These can delay healing. If you need help quitting, ask your health care provider.  Return to your normal activities as told by your health care provider. Ask your health care provider what activities are safe for you.  Keep all follow-up visits as told by your health care provider. This is important. Contact a health care provider if:  Your symptoms get worse.  You are not improving with home care. Summary  Patellofemoral pain syndrome is a condition in which the tissue (cartilage) on the underside of the kneecap (patella) softens or breaks down.  This condition causes swelling in the joint linings and bone surfaces in the knee. This leads to pain in the front of the knee.  This condition may be treated at home with rest, ice, compression, and elevation (RICE).  Use splints, braces, knee supports, or walking aids as directed by your health care provider. This information is not intended to replace advice given to you by your health care provider. Make sure you discuss any questions you have with your health care provider. Document Revised: 05/22/2017 Document Reviewed: 05/22/2017 Elsevier Patient Education  2020 [employer]  

## 2019-07-20 ENCOUNTER — Ambulatory Visit: Payer: BC Managed Care – PPO | Attending: Internal Medicine

## 2019-07-20 DIAGNOSIS — Z23 Encounter for immunization: Secondary | ICD-10-CM

## 2019-07-20 NOTE — Progress Notes (Signed)
   Covid-19 Vaccination Clinic  Name:  Gregory Matthews    MRN: [id number] DOB: 1980-05-18  07/20/2019  Mr. Gregory Matthews was observed post Covid-19 immunization for 15 minutes without incident. He was provided with Vaccine Information Sheet and instruction to access the V-Safe system.   Mr. Gregory Matthews was instructed to call 911 with any severe reactions post vaccine: Marland Kitchen Difficulty breathing  . Swelling of face and throat  . A fast heartbeat  . A bad rash all over body  . Dizziness and weakness   Immunizations Administered    Name Date Dose VIS Date Route   Moderna COVID-19 Vaccine 07/20/2019 12:33 PM 0.5 mL 03/26/2019 Intramuscular   Manufacturer: Gala Murdoch   Lot: [id number]   NDC: 85927-639-43

## 2019-08-07 ENCOUNTER — Telehealth: Payer: Self-pay | Admitting: Family Medicine

## 2019-08-07 DIAGNOSIS — F332 Major depressive disorder, recurrent severe without psychotic features: Secondary | ICD-10-CM

## 2019-08-07 MED ORDER — VORTIOXETINE HBR 20 MG PO TABS: 20.0000 mg | ORAL_TABLET | Freq: Every day | ORAL | 0 refills | Status: DC

## 2019-08-07 NOTE — Telephone Encounter (Signed)
L/m for pt to call and schedule an appt

## 2019-08-07 NOTE — Telephone Encounter (Signed)
Pt called stating he needs a refill on Trintellix to be sent to his pharmacy. He will be out in a few days.

## 2019-08-30 ENCOUNTER — Encounter: Payer: Self-pay | Admitting: Family Medicine

## 2019-08-30 ENCOUNTER — Other Ambulatory Visit: Payer: Self-pay

## 2019-08-30 ENCOUNTER — Ambulatory Visit (INDEPENDENT_AMBULATORY_CARE_PROVIDER_SITE_OTHER): Payer: BC Managed Care – PPO | Admitting: Family Medicine

## 2019-08-30 DIAGNOSIS — F5104 Psychophysiologic insomnia: Secondary | ICD-10-CM

## 2019-08-30 DIAGNOSIS — F411 Generalized anxiety disorder: Secondary | ICD-10-CM

## 2019-08-30 DIAGNOSIS — G43009 Migraine without aura, not intractable, without status migrainosus: Secondary | ICD-10-CM

## 2019-08-30 DIAGNOSIS — F330 Major depressive disorder, recurrent, mild: Secondary | ICD-10-CM | POA: Diagnosis not present

## 2019-08-30 DIAGNOSIS — F331 Major depressive disorder, recurrent, moderate: Secondary | ICD-10-CM | POA: Diagnosis not present

## 2019-08-30 MED ORDER — VORTIOXETINE HBR 20 MG PO TABS: 20.0000 mg | ORAL_TABLET | Freq: Every day | ORAL | 1 refills | Status: DC

## 2019-08-30 MED ORDER — AMITRIPTYLINE HCL 10 MG PO TABS: 10.0000 mg | ORAL_TABLET | Freq: Every day | ORAL | 1 refills | Status: DC

## 2019-08-30 MED ORDER — DIVALPROEX SODIUM 250 MG PO DR TAB: 250.0000 mg | DELAYED_RELEASE_TABLET | Freq: Two times a day (BID) | ORAL | 0 refills | Status: DC

## 2019-08-30 MED ORDER — HYDROXYZINE HCL 10 MG PO TABS: 10.0000 mg | ORAL_TABLET | Freq: Three times a day (TID) | ORAL | 1 refills | Status: DC | PRN

## 2019-08-30 NOTE — Progress Notes (Signed)
Name: Gregory Matthews   MRN: [id number]    DOB: 01/07/1981   Date:08/30/2019       Progress Note  Subjective  Chief Complaint  Chief Complaint  Patient presents with  . Follow-up  . Depression    medication refill    I connected with  Daryll Spisak Bhatt on 08/30/19 at  7:40 AM EDT by telephone and verified that I am speaking with the correct person using two identifiers.  I discussed the limitations, risks, security and privacy concerns of performing an evaluation and management service by telephone and the availability of in person appointments. Staff also discussed with the patient that there may be a patient responsible charge related to this service. Patient Location: at work  Provider Location: [facility]   HPI  MDD/GAD: he is getting marital counseling and also solo sessions with the same therapist, no longer seeing Molli Hazard , he states therapy has been very helpful. Appointmentsabout once a month for himself and about every2weeksas a couple). His Phq 9is a little worse,  it was down to 3 and is now up to 7 again, but he no longer has suicidal thoughts. Heis still taking Depakote but mood has not been as stable - it has been going on for the past few weeks . 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 He states Adderal really helps him, helps him from having down days and helps with energy but is causing him to have testicular pain about 4 hours after he takes medication. He called in Dec because he was testicular pain about 4 hours after taking Adderal and he states no pain as long as he only takes one daily , he states he will try going up to a day again . He is crashing in the mid afternoon   Insomnia:he is doing well on hydroxyzine, getting at least 6 hours every night, sometimes he gets 8 hours of  sleep,  he is waking up feeling rested  Migraine:We have tried Topamax and Imitrex. He has stopped both medication secondary to side effects. Topamax he stopped because of change in taste and Imitrex, because flushing sensation and chest tightness. Episodes of migraine are described as a dull headache, followed by nausea andthe pain intensifies and becomes throbbingorpounding sensation in different parts of his head. It is associated with photophobia, phonophobia and movement - such as walking. He stopped Nortriptyline last Fall and episodes increased again to 3-4 a week, back on medication since Feb 2021 and episodes down to about once a week.   AR: he has been sneezing a lot, watery eyes, some sinus pressure, some rhinorrhea. Only claritin otc  Back pain and knee pain: seen by Dr. Dorris Fetch, he is doing well now   Patient Active Problem List   Diagnosis Date Noted  . Acute mechanical low back pain with duration of less than six weeks 07/05/2019  . Severe episode of recurrent major depressive disorder, without psychotic features (HCC) 07/17/2018  . Headache, migraine 06/08/2015  . Obesity (BMI 30.0-34.9) 06/08/2015  . Allergic rhinitis, seasonal 07/26/2013    Past Surgical History:  Procedure Laterality Date  . RHINOPLASTY    . TONSILLECTOMY      Family History  Problem Relation Age of Onset  . Cancer Mother  Breast  . Hypertension Father   . Heart disease Father   . Heart attack Father   . Cancer Maternal Aunt        Breast  . Hyperlipidemia Maternal Grandmother   . Parkinson's disease Paternal Grandfather     Social History   Socioeconomic History  . Marital status: Married    Spouse name: Morrie Sheldon   . Number of children: 0  . Years of education: Not on file  . Highest education level: Bachelor's degree (e.g., BA, AB, BS)  Occupational History  . Occupation: Runner, broadcasting/film/video   Tobacco Use  . Smoking status: Never Smoker  . Smokeless tobacco: Never Used    Substance and Sexual Activity  . Alcohol use: No    Alcohol/week: 0.0 standard drinks  . Drug use: No  . Sexual activity: Yes    Partners: Female  Other Topics Concern  . Not on file  Social History Narrative   Married, he is a Manufacturing engineer Strain: Low Risk   . Difficulty of Paying Living Expenses: Not very hard  Food Insecurity: No Food Insecurity  . Worried About Programme researcher, broadcasting/film/video in the Last Year: Never true  . Ran Out of Food in the Last Year: Never true  Transportation Needs: No Transportation Needs  . Lack of Transportation (Medical): No  . Lack of Transportation (Non-Medical): No  Physical Activity: Insufficiently Active  . Days of Exercise per Week: 2 days  . Minutes of Exercise per Session: 60 min  Stress: No Stress Concern Present  . Feeling of Stress : Only a little  Social Connections: Not Isolated  . Frequency of Communication with Friends and Family: More than three times a week  . Frequency of Social Gatherings with Friends and Family: More than three times a week  . Attends Religious Services: More than 4 times per year  . Active Member of Clubs or Organizations: Yes  . Attends Banker Meetings: More than 4 times per year  . Marital Status: Married  Catering manager Violence: Not At Risk  . Fear of Current or Ex-Partner: No  . Emotionally Abused: No  . Physically Abused: No  . Sexually Abused: No     Current Outpatient Medications:  .  amitriptyline (ELAVIL) 10 MG tablet, Take 1 tablet (10 mg total) by mouth at bedtime., Disp: 90 tablet, Rfl: 0 .  amphetamine-dextroamphetamine (ADDERALL) 10 MG tablet, Take 1 tablet (10 mg total) by mouth 2 (two) times daily., Disp: 60 tablet, Rfl: 0 .  amphetamine-dextroamphetamine (ADDERALL) 10 MG tablet, Take 1 tablet (10 mg total) by mouth 2 (two) times daily., Disp: 60 tablet, Rfl: 0 .  amphetamine-dextroamphetamine (ADDERALL) 10 MG tablet, Take 1 tablet  (10 mg total) by mouth 2 (two) times daily., Disp: 60 tablet, Rfl: 0 .  cyclobenzaprine (FLEXERIL) 10 MG tablet, Take 1 tablet (10 mg total) by mouth 3 (three) times daily as needed for muscle spasms. Take mainly at bedtime as may make drowsy. Not drive if taking, Disp: 30 tablet, Rfl: 1 .  divalproex (DEPAKOTE) 250 MG DR tablet, Take 1 tablet (250 mg total) by mouth every evening., Disp: 90 tablet, Rfl: 0 .  hydrOXYzine (ATARAX/VISTARIL) 10 MG tablet, Take 1 tablet (10 mg total) by mouth 3 (three) times daily as needed., Disp: 90 tablet, Rfl: 0 .  vortioxetine HBr (TRINTELLIX) 20 MG TABS tablet, Take 1 tablet (20 mg total) by mouth daily., Disp: 30 tablet, Rfl:  0 .  naproxen sodium (ANAPROX DS) 550 MG tablet, Take 1 tablet (550 mg total) by mouth 2 (two) times daily as needed. Take with meals (Patient not taking: Reported on 08/30/2019), Disp: 20 tablet, Rfl: 1  Allergies  Allergen Reactions  . Penicillins Rash    I personally reviewed active problem list, medication list, allergies, family history, social history, health maintenance with the patient/caregiver today.   ROS  Ten systems reviewed and is negative except as mentioned in HPI   Objective  Virtual encounter, vitals not obtained.   There is no height or weight on file to calculate BMI.  Physical Exam  Awake, alert and oriented  PHQ2/9: Depression screen [facility] 2/9 08/30/2019 07/05/2019 06/27/2019 05/15/2019 04/10/2019  Decreased Interest 1 0 0 1 1  Down, Depressed, Hopeless 1 0 0 1 1  PHQ - 2 Score 2 0 0 2 2  Altered sleeping 2 2 1 1 2   Tired, decreased energy 1 0 1 1 1   Change in appetite 0 0 0 0 0  Feeling bad or failure about yourself  1 0 1 1 1   Trouble concentrating 1 0 1 1 1   Moving slowly or fidgety/restless 0 0 0 1 0  Suicidal thoughts 0 0 0 0 0  PHQ-9 Score 7 2 4 7 7   Difficult doing work/chores Somewhat difficult Not difficult at all Somewhat difficult Somewhat difficult Somewhat difficult  Some recent data might  be hidden   PHQ-2/9 Result is positive.    Fall Risk: Fall Risk  08/30/2019 07/05/2019 06/27/2019 05/15/2019 04/10/2019  Falls in the past year? 0 0 0 1 1  Number falls in past yr: 0 0 0 0 0  Injury with Fall? 0 0 0 0 0  Risk for fall due to : - - - History of fall(s) History of fall(s)  Follow up Falls evaluation completed - - - -     Assessment & Plan  1. Moderate episode of recurrent major depressive disorder (HCC)  - vortioxetine HBr (TRINTELLIX) 20 MG TABS tablet; Take 1 tablet (20 mg total) by mouth daily.  Dispense: 90 tablet; Refill: 1  2. Psychophysiological insomnia  - hydrOXYzine (ATARAX/VISTARIL) 10 MG tablet; Take 1 tablet (10 mg total) by mouth 3 (three) times daily as needed.  Dispense: 90 tablet; Refill: 1  3. GAD (generalized anxiety disorder)  - hydrOXYzine (ATARAX/VISTARIL) 10 MG tablet; Take 1 tablet (10 mg total) by mouth 3 (three) times daily as needed.  Dispense: 90 tablet; Refill: 1  4. Mild episode of recurrent major depressive disorder (HCC)  - divalproex (DEPAKOTE) 250 MG DR tablet; Take 1 tablet (250 mg total) by mouth 2 (two) times daily.  Dispense: 180 tablet; Refill: 0  5. Migraine without aura and without status migrainosus, not intractable  - divalproex (DEPAKOTE) 250 MG DR tablet; Take 1 tablet (250 mg total) by mouth 2 (two) times daily.  Dispense: 180 tablet; Refill: 0 - amitriptyline (ELAVIL) 10 MG tablet; Take 1 tablet (10 mg total) by mouth at bedtime.  Dispense: 90 tablet; Refill: 1  I discussed the assessment and treatment plan with the patient. The patient was provided an opportunity to ask questions and all were answered. The patient agreed with the plan and demonstrated an understanding of the instructions.   The patient was advised to call back or seek an in-person evaluation if the symptoms worsen or if the condition fails to improve as anticipated.  I provided 25  minutes of non-face-to-face time during this  encounter.  Loistine Chance, MD

## 2019-10-24 ENCOUNTER — Other Ambulatory Visit: Payer: Self-pay

## 2019-10-24 ENCOUNTER — Other Ambulatory Visit: Payer: Self-pay | Admitting: Family Medicine

## 2019-10-24 ENCOUNTER — Telehealth: Payer: Self-pay | Admitting: Family Medicine

## 2019-10-24 DIAGNOSIS — F330 Major depressive disorder, recurrent, mild: Secondary | ICD-10-CM

## 2019-10-24 MED ORDER — AMPHETAMINE-DEXTROAMPHETAMINE 10 MG PO TABS: 10.0000 mg | ORAL_TABLET | Freq: Two times a day (BID) | ORAL | 0 refills | Status: DC

## 2019-10-24 NOTE — Telephone Encounter (Signed)
lvm-prescription has been sent to pharmacy

## 2019-10-24 NOTE — Telephone Encounter (Signed)
Pt needs refill on his adderall to be sent to his pharmacy.

## 2019-11-04 IMAGING — US US ABDOMEN COMPLETE
1 series · 14 of 25 positions shown · non-contrast
Comparison: CT AP 06/29/2017

CLINICAL DATA: Right upper quadrant and right lower quadrant
tenderness. Diarrhea for 4 days.

EXAM:
ABDOMEN ULTRASOUND COMPLETE

[Series 1: us abdomen complete · 0.23mm/px · 14 of 121 slices shown]
[im 1/121]
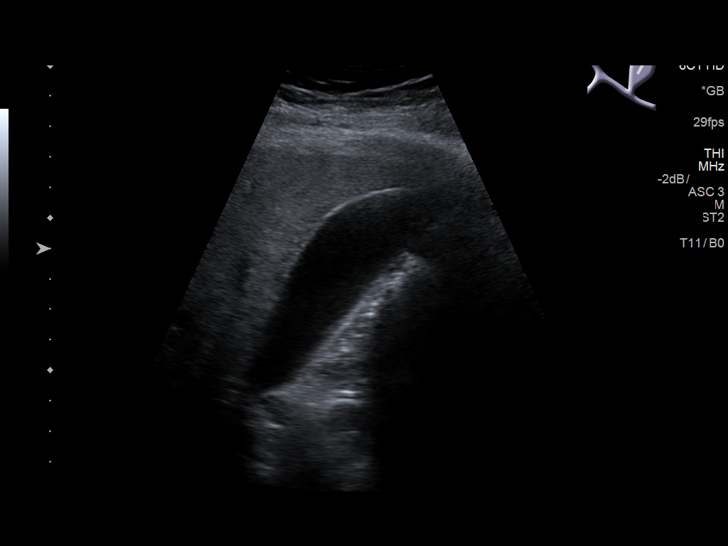
[im 11/121]
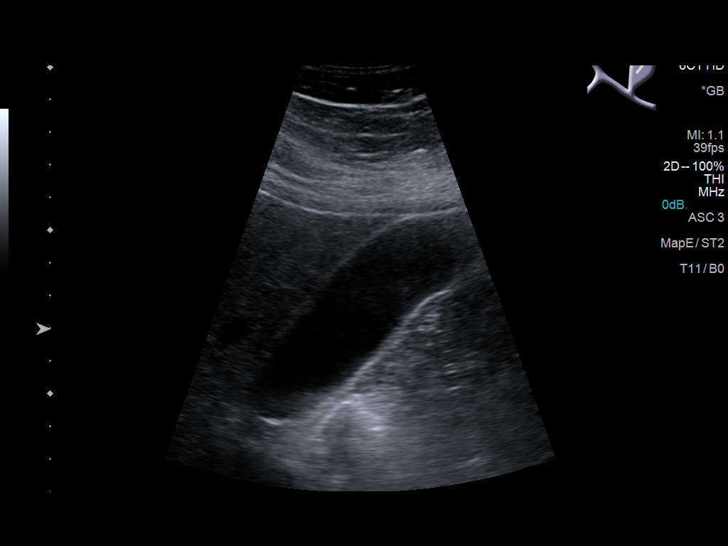
[im 21/121]
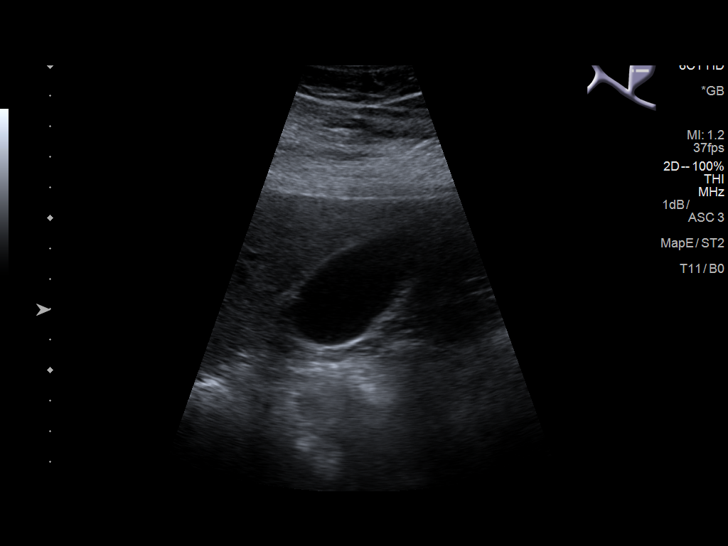
[im 31/121]
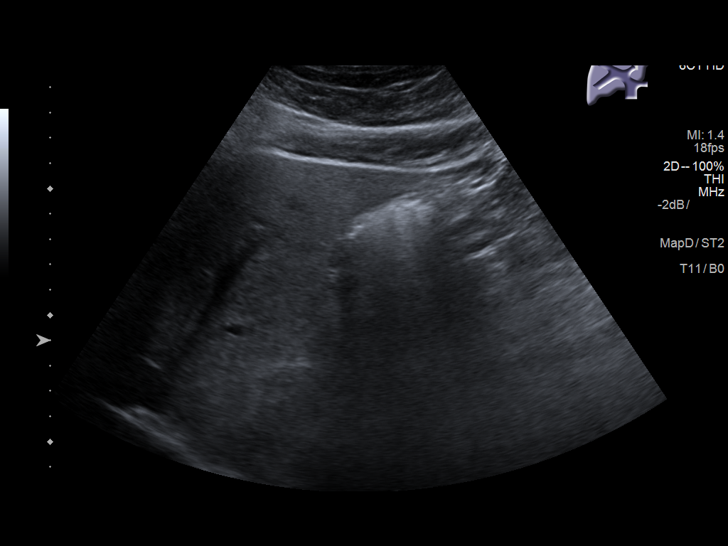
[im 41/121]
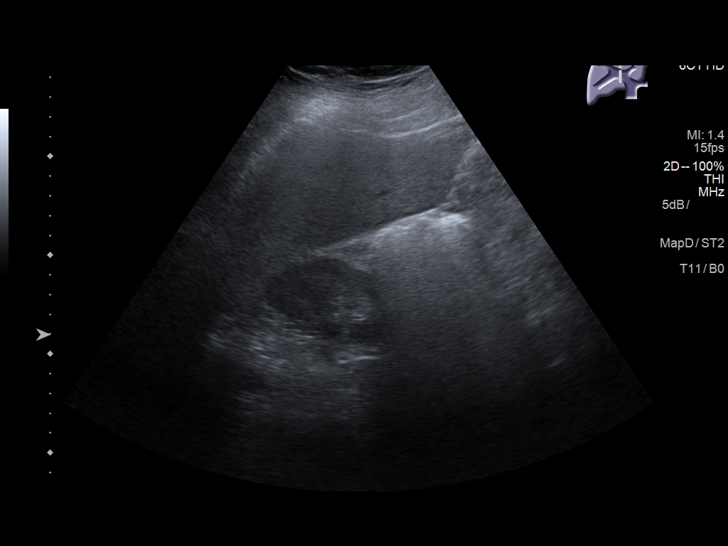
[im 46/121]
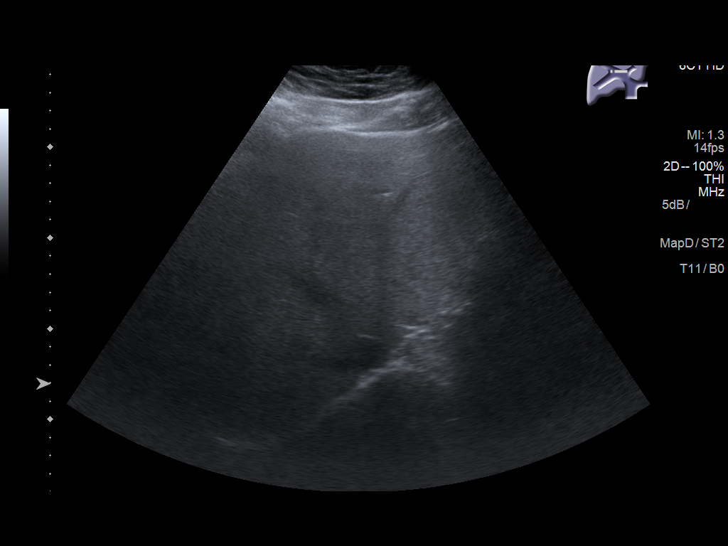
[im 56/121]
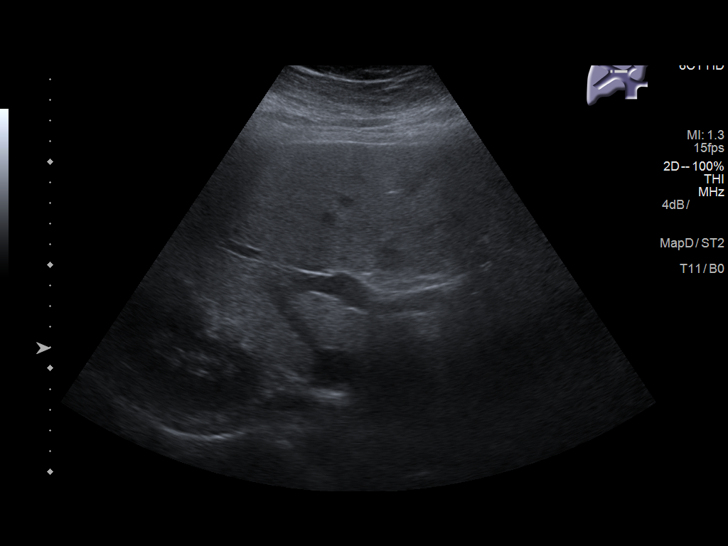
[im 66/121]
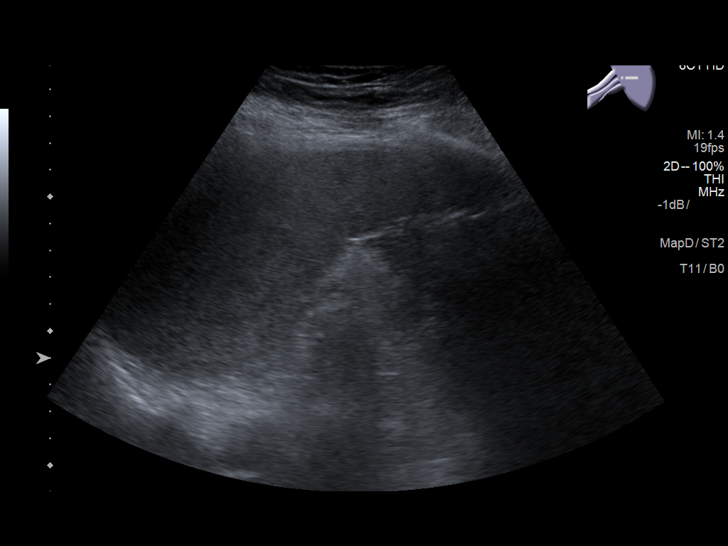
[im 76/121]
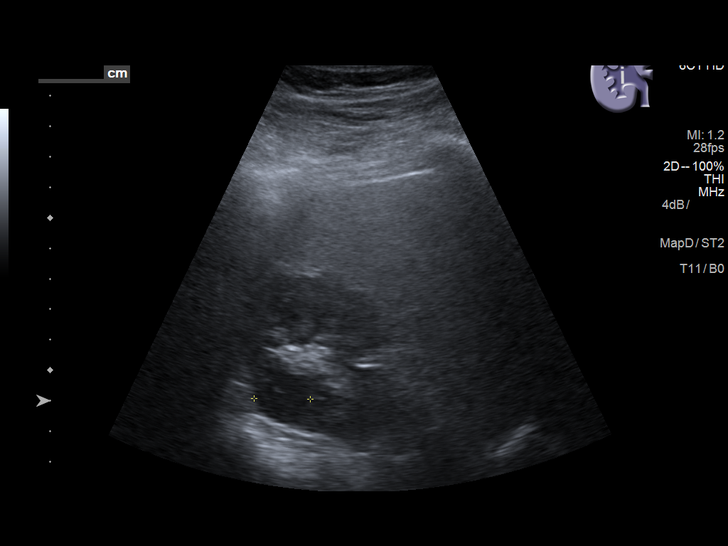
[im 81/121]
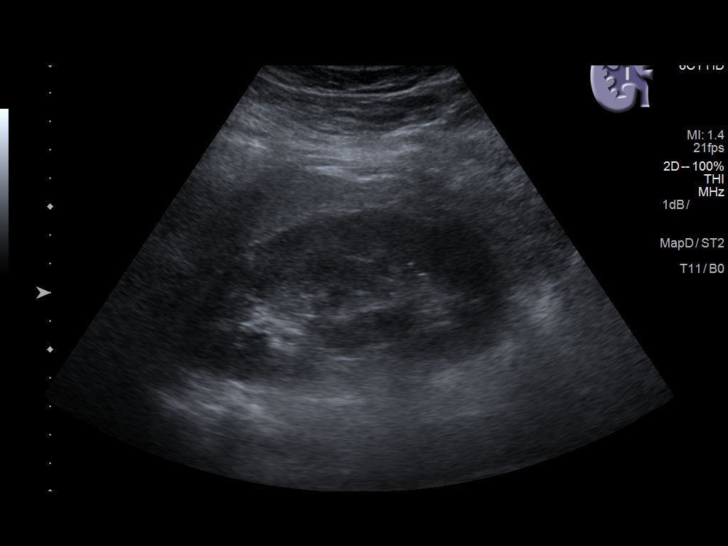
[im 91/121]
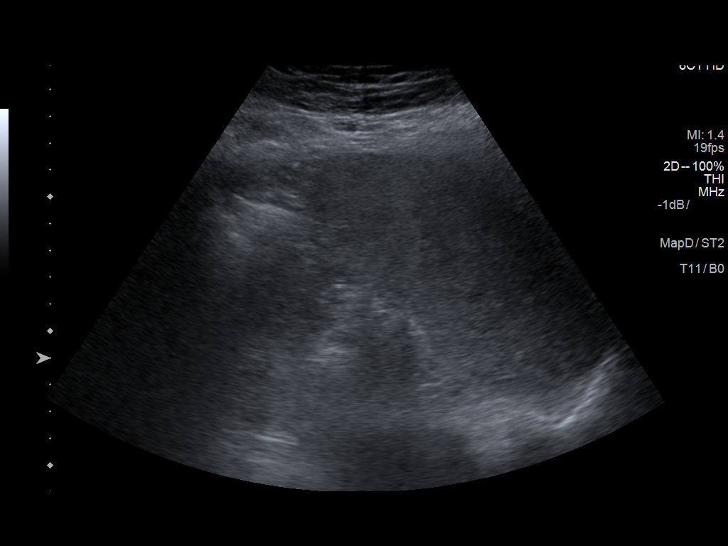
[im 101/121]
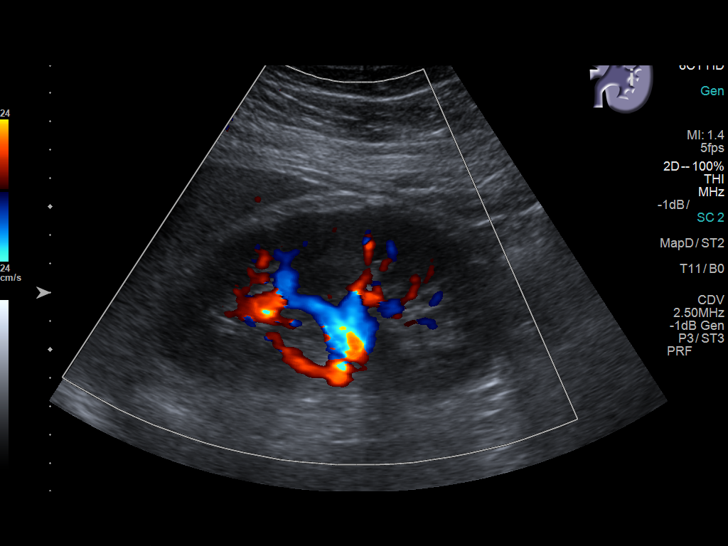
[im 111/121]
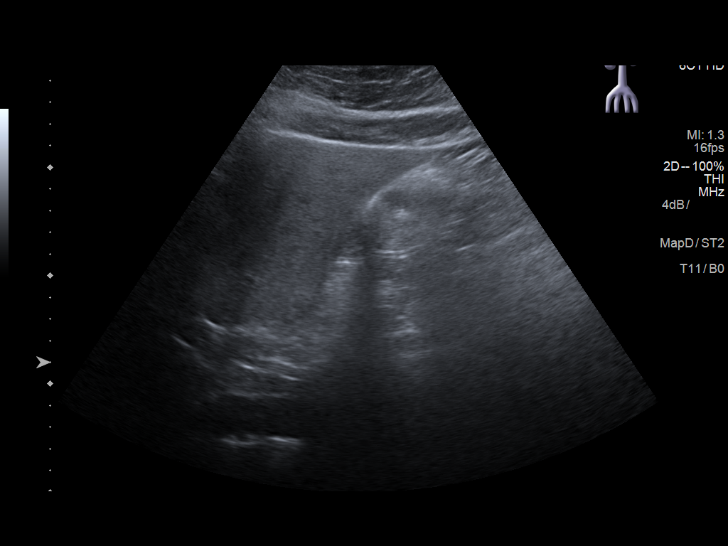
[im 121/121]
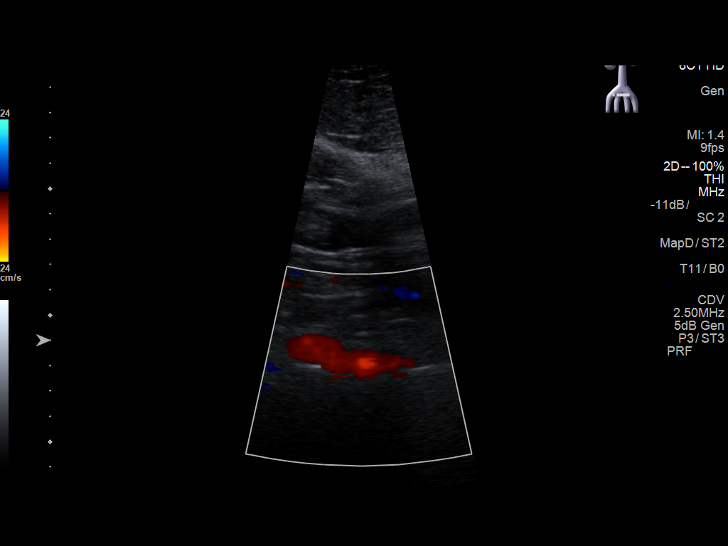

[14 of 25 positions shown; findings below may reference images not displayed]

FINDINGS: Gallbladder: No gallstones or wall thickening visualized. No
sonographic Murphy sign noted by sonographer.

Common bile duct: Diameter: 4.2 mm

Liver: No focal lesion. Increased parenchymal echogenicity. Portal
vein is patent on color Doppler imaging with normal direction of
blood flow towards the liver.

IVC: No abnormality visualized.

Pancreas: Visualized portion unremarkable.

Spleen: Size and appearance within normal limits.

Right Kidney: Length: 11.7 cm. Cyst within the upper pole measures
1.8 by 1.8 x 1.8 cm.. Echogenicity within normal limits. No mass or
hydronephrosis visualized.

Left Kidney: Length: 12.3 cm. Echogenicity within normal limits. No
mass or hydronephrosis visualized.

Abdominal aorta: No aneurysm visualized.

Other findings: None.
IMPRESSION: 1. No acute findings.
2. Echogenic liver suggestive of hepatic steatosis.
3. Right kidney cyst.

## 2019-11-08 ENCOUNTER — Encounter: Payer: Self-pay | Admitting: Family Medicine

## 2019-11-08 ENCOUNTER — Telehealth (INDEPENDENT_AMBULATORY_CARE_PROVIDER_SITE_OTHER): Payer: BC Managed Care – PPO | Admitting: Family Medicine

## 2019-11-08 VITALS — Ht 70.0 in | Wt 252.0 lb

## 2019-11-08 DIAGNOSIS — R05 Cough: Secondary | ICD-10-CM | POA: Diagnosis not present

## 2019-11-08 DIAGNOSIS — J069 Acute upper respiratory infection, unspecified: Secondary | ICD-10-CM | POA: Diagnosis not present

## 2019-11-08 DIAGNOSIS — R059 Cough, unspecified: Secondary | ICD-10-CM

## 2019-11-08 MED ORDER — PROMETHAZINE-DM 6.25-15 MG/5ML PO SYRP: 5.0000 mL | ORAL_SOLUTION | Freq: Four times a day (QID) | ORAL | 0 refills | Status: DC | PRN

## 2019-11-08 MED ORDER — PREDNISONE 20 MG PO TABS: 40.0000 mg | ORAL_TABLET | Freq: Every day | ORAL | 0 refills | Status: AC

## 2019-11-08 MED ORDER — ALBUTEROL SULFATE HFA 108 (90 BASE) MCG/ACT IN AERS: 2.0000 | INHALATION_SPRAY | Freq: Four times a day (QID) | RESPIRATORY_TRACT | 0 refills | Status: DC | PRN

## 2019-11-08 MED ORDER — BENZONATATE 100 MG PO CAPS: 100.0000 mg | ORAL_CAPSULE | Freq: Three times a day (TID) | ORAL | 0 refills | Status: DC | PRN

## 2019-11-08 NOTE — Progress Notes (Signed)
Name: Gregory Matthews   MRN: [id number]    DOB: March 14, 1981   Date:11/08/2019       Progress Note  Subjective:    Chief Complaint  Chief Complaint  Patient presents with  . Sore Throat    x1day  . Cough    x1 day  . Ear Pain    right    Unable to reach pt with multiple attempts Through mychart, with texting link   I connected with  Gregory Matthews  on 11/08/19 at  2:20 PM EDT - finally connected at 4:30 pm until 4:47 pm by a video enabled telemedicine application and verified that I am speaking with the correct person using two identifiers.  I discussed the limitations of evaluation and management by telemedicine and the availability of in person appointments. The patient expressed understanding and agreed to proceed. Staff also discussed with the patient that there may be a patient responsible charge related to this service. Patient Location:  home Provider Location: [facility] clinic Additional Individuals present:     URI  This is a new problem. The current episode started yesterday. The problem has been gradually worsening. There has been no fever. Associated symptoms include congestion, coughing, a plugged ear sensation, rhinorrhea, sinus pain and a sore throat. Pertinent negatives include no abdominal pain, chest pain, ear pain, headaches, joint pain, joint swelling, nausea, neck pain, rash, vomiting or wheezing. Treatments tried: OTC cold/cough/flu meds. The treatment provided no relief.  Cough This is a new problem. The current episode started yesterday. The problem has been gradually worsening. The problem occurs constantly. The cough is non-productive. Associated symptoms include ear congestion, nasal congestion, postnasal drip, rhinorrhea and a sore throat. Pertinent negatives include no chest pain, ear pain, fever, headaches, hemoptysis, rash, shortness of breath, sweats or wheezing. Nothing aggravates the symptoms. He has tried OTC cough suppressant (cough drops) for the  symptoms. The treatment provided no relief. His past medical history is significant for bronchitis and pneumonia. There is no history of asthma, COPD, emphysema or environmental allergies.    Pt presents with URI sx, sore throat, ears hurting, coughing, dry Wife was sick       Patient Active Problem List   Diagnosis Date Noted  . Acute mechanical low back pain with duration of less than six weeks 07/05/2019  . Severe episode of recurrent major depressive disorder, without psychotic features (HCC) 07/17/2018  . Headache, migraine 06/08/2015  . Obesity (BMI 30.0-34.9) 06/08/2015  . Allergic rhinitis, seasonal 07/26/2013    Social History   Tobacco Use  . Smoking status: Never Smoker  . Smokeless tobacco: Never Used  Substance Use Topics  . Alcohol use: No    Alcohol/week: 0.0 standard drinks     Current Outpatient Medications:  .  amitriptyline (ELAVIL) 10 MG tablet, Take 1 tablet (10 mg total) by mouth at bedtime., Disp: 90 tablet, Rfl: 1 .  amphetamine-dextroamphetamine (ADDERALL) 10 MG tablet, Take 1 tablet (10 mg total) by mouth 2 (two) times daily., Disp: 60 tablet, Rfl: 0 .  divalproex (DEPAKOTE) 250 MG DR tablet, Take 1 tablet (250 mg total) by mouth 2 (two) times daily., Disp: 180 tablet, Rfl: 0 .  hydrOXYzine (ATARAX/VISTARIL) 10 MG tablet, Take 1 tablet (10 mg total) by mouth 3 (three) times daily as needed., Disp: 90 tablet, Rfl: 1 .  vortioxetine HBr (TRINTELLIX) 20 MG TABS tablet, Take 1 tablet (20 mg total) by mouth daily., Disp: 90 tablet, Rfl: 1  Allergies  Allergen  Reactions  . Penicillins Rash   I personally reviewed active problem list, medication list, allergies, family history, social history, health maintenance, notes from last encounter, lab results, imaging with the patient/caregiver today.    Review of Systems  Constitutional: Negative.  Negative for fever.  HENT: Positive for congestion, postnasal drip, rhinorrhea, sinus pain and sore throat.  Negative for ear pain.   Eyes: Negative.   Respiratory: Positive for cough. Negative for hemoptysis, shortness of breath and wheezing.   Cardiovascular: Negative.  Negative for chest pain.  Gastrointestinal: Negative.  Negative for abdominal pain, nausea and vomiting.  Endocrine: Negative.   Genitourinary: Negative.   Musculoskeletal: Negative.  Negative for joint pain and neck pain.  Skin: Negative.  Negative for rash.  Allergic/Immunologic: Negative.  Negative for environmental allergies.  Neurological: Negative.  Negative for headaches.  Hematological: Negative.   Psychiatric/Behavioral: Negative.   All other systems reviewed and are negative.     Objective:   Virtual encounter, vitals limited, only able to obtain the following Today's Vitals   11/08/19 1349  Weight: 252 lb (114.3 kg)  Height: 5\' 10"  (1.778 m)  PainSc: 5   PainLoc: Throat   Body mass index is 36.16 kg/m. Nursing Note and Vital Signs reviewed.  Physical Exam Vitals and nursing note reviewed.  Constitutional:      General: He is not in acute distress.    Appearance: Normal appearance. He is well-developed. He is not ill-appearing, toxic-appearing or diaphoretic.     Comments: Non-toxic appearing, looks a little tired  HENT:     Head: Normocephalic and atraumatic.     Right Ear: External ear normal.     Left Ear: External ear normal.     Nose: Nose normal.  Eyes:     General:        Right eye: No discharge.        Left eye: No discharge.     Conjunctiva/sclera: Conjunctivae normal.  Neck:     Trachea: No tracheal deviation.  Pulmonary:     Effort: No respiratory distress.     Breath sounds: No stridor.     Comments: Frequent short coughing fits Skin:    Coloration: Skin is not jaundiced or pale.     Findings: No rash.  Neurological:     Mental Status: He is alert.  Psychiatric:        Attention and Perception: Attention normal.        Mood and Affect: Mood normal.        Speech: Speech  normal.        Behavior: Behavior normal. Behavior is cooperative.     PE limited by telephone encounter  No results found for this or any previous visit (from the past 72 hour(s)).  Assessment and Plan:   1. Upper respiratory tract infection, unspecified type Encouraged supportive and sx tx, no current indication for abx right now, wife had viral illness and recovered, likely viral URI will run its course -encouraged him to use Tylenol and ibuprofen, push fluids and get plenty of rest.  Can use antihistamines, decongestants, cough suppressants etc.  He does have a history of sinus surgery, he sometimes gets a sinus infection requiring antibiotics but is not very frequently.  Encouraged him to follow-up if he has acute worsening of facial sinus pain or pressure with fever. - benzonatate (TESSALON) 100 MG capsule; Take 1-2 capsules (100-200 mg total) by mouth 3 (three) times daily as needed for cough.  Dispense: 60  capsule; Refill: 0  2. Cough Frequent coughing throughout encounter today.  He does endorse feeling tight in his chest and he does have a history of bronchitis and pneumonia but no history of asthma or COPD.  He currently is not having chest pain, shortness of breath, no noted wheeze, fever, night sweats or exertional symptoms.  Encouraged him to do supportive treatment as outlined above and he can try Tessalon Perles or cough syrup.  Encouraged him to start the steroids and get the inhaler if he has any worsening of his cough concerned that his frequent coughing here may be a bronchospasm and he may need treatment for bronchitis and bronchospasm.  He does not feel that he has pneumonia nor is his history of very concerning for this but I encouraged him to follow-up if he has pleuritic chest pain, more productive cough, fever, night sweats, shortness of breath.  Patient verbalized understanding. - benzonatate (TESSALON) 100 MG capsule; Take 1-2 capsules (100-200 mg total) by mouth 3  (three) times daily as needed for cough.  Dispense: 60 capsule; Refill: 0 - promethazine-dextromethorphan (PROMETHAZINE-DM) 6.25-15 MG/5ML syrup; Take 5 mLs by mouth 4 (four) times daily as needed for cough.  Dispense: 118 mL; Refill: 0 - predniSONE (DELTASONE) 20 MG tablet; Take 2 tablets (40 mg total) by mouth daily with breakfast for 5 days.  Dispense: 10 tablet; Refill: 0 - albuterol (VENTOLIN HFA) 108 (90 Base) MCG/ACT inhaler; Inhale 2 puffs into the lungs every 6 (six) hours as needed for wheezing or shortness of breath.  Dispense: 18 g; Refill: 0    -Red flags and when to present for emergency care or RTC including fever >101.52F, chest pain, shortness of breath, new/worsening/un-resolving symptoms, reviewed with patient at time of visit. Follow up and care instructions discussed and provided in AVS. - I discussed the assessment and treatment plan with the patient. The patient was provided an opportunity to ask questions and all were answered. The patient agreed with the plan and demonstrated an understanding of the instructions.  I provided 30+ minutes of non-face-to-face time during this encounter.  Danelle Berry, PA-C 11/08/19 4:27 PM

## 2019-12-12 NOTE — Progress Notes (Signed)
Name: Gregory Matthews   MRN: [id number]    DOB: December 10, 1980   Date:12/13/2019       Progress Note  Subjective  Chief Complaint  Chief Complaint  Patient presents with  . Medication Management    HPI  MDD/GAD: he is getting marital counseling and also solo sessions with the same therapist, no longer seeing Molli Hazard , he states therapy has been very helpful.  His Phq 9is much worse, he states anticipating going back to work .Heis still taking Depakote , taking 2 at night. He was diagnosed 05/2019 only tried Trintelix because he was afraid of possible sexual side effects of other medications but now would like to try Lexapro or Celexa as suggested by therapist. He has lack of energy and motivation but also feels nervous inside. No suicidal thoughts but feels like moving away to get rid of his problems.   Insomnia:hewas  doing well on hydroxyzine, getting at least 6 hours every night, sometimes he gets 8 hours of sleep,  however noticing increase in anxiety and inability to sleep well lately, therapist suggested trying trazodone, discussed drug-drug interaction with Elavil and Lexapro   Migraine:We have tried Topamax and Imitrex. He has stopped both medication secondary to side effects. Topamax he stopped because of change in taste and Imitrex, because flushing sensation and chest tightness. Episodes of migraine are described as a dull headache, followed by nausea andthe pain intensifies and becomes throbbingorpounding sensation in different parts of his head. It is associated with photophobia, phonophobia and movement - such as walking. He stopped Nortriptyline last Fall and episodes increased again to 3-4 a week, back on medication since Feb 2021 but down to about twice a month, we will try Ubrelvy to be used prn.     Patient Active Problem List   Diagnosis Date Noted  . Severe episode of recurrent major depressive disorder, without psychotic features (HCC) 07/17/2018  . Headache,  migraine 06/08/2015  . Obesity (BMI 30.0-34.9) 06/08/2015  . Allergic rhinitis, seasonal 07/26/2013    Past Surgical History:  Procedure Laterality Date  . RHINOPLASTY    . TONSILLECTOMY      Family History  Problem Relation Age of Onset  . Cancer Mother        Breast  . Hypertension Father   . Heart disease Father   . Heart attack Father   . Cancer Maternal Aunt        Breast  . Hyperlipidemia Maternal Grandmother   . Parkinson's disease Paternal Grandfather     Social History   Tobacco Use  . Smoking status: Never Smoker  . Smokeless tobacco: Never Used  Substance Use Topics  . Alcohol use: No    Alcohol/week: 0.0 standard drinks     Current Outpatient Medications:  .  amitriptyline (ELAVIL) 10 MG tablet, Take 1 tablet (10 mg total) by mouth at bedtime., Disp: 90 tablet, Rfl: 1 .  amphetamine-dextroamphetamine (ADDERALL) 10 MG tablet, Take 1 tablet (10 mg total) by mouth 2 (two) times daily., Disp: 60 tablet, Rfl: 0 .  divalproex (DEPAKOTE) 250 MG DR tablet, Take 1 tablet (250 mg total) by mouth 2 (two) times daily., Disp: 180 tablet, Rfl: 0 .  escitalopram (LEXAPRO) 10 MG tablet, Take 1 tablet (10 mg total) by mouth daily., Disp: 30 tablet, Rfl: 1 .  traZODone (DESYREL) 50 MG tablet, Take 0.5-1 tablets (25-50 mg total) by mouth at bedtime as needed for sleep., Disp: 30 tablet, Rfl: 0 .  Ubrogepant (UBRELVY) 100 MG  TABS, Take 1 tablet by mouth daily as needed., Disp: 10 tablet, Rfl: 0  Allergies  Allergen Reactions  . Penicillins Rash    I personally reviewed active problem list, medication list, allergies, family history, social history, health maintenance with the patient/caregiver today.   ROS  Constitutional: Negative for fever or weight change.  Respiratory: Negative for cough and shortness of breath.   Cardiovascular: Negative for chest pain or palpitations.  Gastrointestinal: Negative for abdominal pain, no bowel changes.  Musculoskeletal: Negative  for gait problem or joint swelling.  Skin: Negative for rash.  Neurological: Negative for dizziness , positiive for intermittent  headache.  No other specific complaints in a complete review of systems (except as listed in HPI above).  Objective  Vitals:   12/13/19 1453  BP: 124/78  Pulse: 96  SpO2: 97%  Weight: 247 lb (112 kg)  Height: 5\' 10"  (1.778 m)    Body mass index is 35.44 kg/m.  Physical Exam  Constitutional: Patient appears well-developed and well-nourished. Obese No distress.  HEENT: head atraumatic, normocephalic, pupils equal and reactive to light,  neck supple Cardiovascular: Normal rate, regular rhythm and normal heart sounds.  No murmur heard. No BLE edema. Pulmonary/Chest: Effort normal and breath sounds normal. No respiratory distress. Abdominal: Soft.  There is no tenderness. Psychiatric: Patient has a normal mood and affect. behavior is normal. Judgment and thought content normal.  PHQ2/9: Depression screen [facility] 2/9 12/13/2019 11/08/2019 08/30/2019 07/05/2019 06/27/2019  Decreased Interest 2 0 1 0 0  Down, Depressed, Hopeless 2 0 1 0 0  PHQ - 2 Score 4 0 2 0 0  Altered sleeping 2 2 2 2 1   Tired, decreased energy 3 1 1  0 1  Change in appetite 0 0 0 0 0  Feeling bad or failure about yourself  2 0 1 0 1  Trouble concentrating 1 0 1 0 1  Moving slowly or fidgety/restless 2 0 0 0 0  Suicidal thoughts 0 0 0 0 0  PHQ-9 Score 14 3 7 2 4   Difficult doing work/chores - Not difficult at all Somewhat difficult Not difficult at all Somewhat difficult  Some recent data might be hidden    phq 9 is positive   Fall Risk: Fall Risk  12/13/2019 11/08/2019 08/30/2019 07/05/2019 06/27/2019  Falls in the past year? 0 0 0 0 0  Number falls in past yr: - 0 0 0 0  Injury with Fall? - 0 0 0 0  Risk for fall due to : - - - - -  Follow up - - Falls evaluation completed - -     Assessment & Plan  1. Moderate episode of recurrent major depressive disorder (HCC)  - escitalopram  (LEXAPRO) 10 MG tablet; Take 1 tablet (10 mg total) by mouth daily.  Dispense: 30 tablet; Refill: 1  2. Class 2 obesity due to excess calories without serious comorbidity with body mass index (BMI) of 35.0 to 35.9 in adult  Discussed with the patient the risk posed by an increased BMI. Discussed importance of portion control, calorie counting and at least 150 minutes of physical activity weekly. Avoid sweet beverages and drink more water. Eat at least 6 servings of fruit and vegetables daily   3. Migraine without aura and without status migrainosus, not intractable  - amitriptyline (ELAVIL) 10 MG tablet; Take 1 tablet (10 mg total) by mouth at bedtime.  Dispense: 90 tablet; Refill: 1 - divalproex (DEPAKOTE) 250 MG DR tablet; Take 1 tablet (  250 mg total) by mouth 2 (two) times daily.  Dispense: 180 tablet; Refill: 0 - Ubrogepant (UBRELVY) 100 MG TABS; Take 1 tablet by mouth daily as needed.  Dispense: 10 tablet; Refill: 0  4. GAD (generalized anxiety disorder)  We will try lexapro   5. Psychophysiological insomnia  - traZODone (DESYREL) 50 MG tablet; Take 0.5-1 tablets (25-50 mg total) by mouth at bedtime as needed for sleep.  Dispense: 30 tablet; Refill: 0

## 2019-12-13 ENCOUNTER — Encounter: Payer: Self-pay | Admitting: Family Medicine

## 2019-12-13 ENCOUNTER — Other Ambulatory Visit: Payer: Self-pay

## 2019-12-13 ENCOUNTER — Ambulatory Visit: Payer: BC Managed Care – PPO | Admitting: Family Medicine

## 2019-12-13 VITALS — BP 124/78 | HR 96 | Ht 70.0 in | Wt 247.0 lb

## 2019-12-13 DIAGNOSIS — F331 Major depressive disorder, recurrent, moderate: Secondary | ICD-10-CM | POA: Diagnosis not present

## 2019-12-13 DIAGNOSIS — F5104 Psychophysiologic insomnia: Secondary | ICD-10-CM

## 2019-12-13 DIAGNOSIS — E6609 Other obesity due to excess calories: Secondary | ICD-10-CM | POA: Diagnosis not present

## 2019-12-13 DIAGNOSIS — G43009 Migraine without aura, not intractable, without status migrainosus: Secondary | ICD-10-CM

## 2019-12-13 DIAGNOSIS — F411 Generalized anxiety disorder: Secondary | ICD-10-CM | POA: Diagnosis not present

## 2019-12-13 DIAGNOSIS — F330 Major depressive disorder, recurrent, mild: Secondary | ICD-10-CM

## 2019-12-13 DIAGNOSIS — Z6835 Body mass index (BMI) 35.0-35.9, adult: Secondary | ICD-10-CM

## 2019-12-13 MED ORDER — ESCITALOPRAM OXALATE 10 MG PO TABS: 10.0000 mg | ORAL_TABLET | Freq: Every day | ORAL | 1 refills | Status: DC

## 2019-12-13 MED ORDER — UBRELVY 100 MG PO TABS: 1.0000 | ORAL_TABLET | Freq: Every day | ORAL | 0 refills | Status: DC | PRN

## 2019-12-13 MED ORDER — DIVALPROEX SODIUM 250 MG PO DR TAB: 250.0000 mg | DELAYED_RELEASE_TABLET | Freq: Two times a day (BID) | ORAL | 0 refills | Status: DC

## 2019-12-13 MED ORDER — TRAZODONE HCL 50 MG PO TABS: 25.0000 mg | ORAL_TABLET | Freq: Every evening | ORAL | 0 refills | Status: DC | PRN

## 2019-12-13 MED ORDER — AMITRIPTYLINE HCL 10 MG PO TABS: 10.0000 mg | ORAL_TABLET | Freq: Every day | ORAL | 1 refills | Status: DC

## 2019-12-19 ENCOUNTER — Other Ambulatory Visit: Payer: Self-pay | Admitting: Family Medicine

## 2019-12-19 DIAGNOSIS — F330 Major depressive disorder, recurrent, mild: Secondary | ICD-10-CM

## 2019-12-19 NOTE — Telephone Encounter (Signed)
Pt needs a refill on his Adderell to be sent to [facility]. Pt will be out on 12/23/2019.

## 2019-12-20 MED ORDER — AMPHETAMINE-DEXTROAMPHETAMINE 10 MG PO TABS: 10.0000 mg | ORAL_TABLET | Freq: Two times a day (BID) | ORAL | 0 refills | Status: DC

## 2019-12-20 NOTE — Telephone Encounter (Signed)
Called [facility] they don't have a prescription for Adderall. Please send to [facility].

## 2020-01-07 ENCOUNTER — Ambulatory Visit: Payer: BC Managed Care – PPO | Admitting: Family Medicine

## 2020-01-08 ENCOUNTER — Ambulatory Visit: Payer: BC Managed Care – PPO | Admitting: Family Medicine

## 2020-01-13 ENCOUNTER — Other Ambulatory Visit: Payer: Self-pay | Admitting: Family Medicine

## 2020-01-13 DIAGNOSIS — F5104 Psychophysiologic insomnia: Secondary | ICD-10-CM

## 2020-01-13 MED ORDER — TRAZODONE HCL 50 MG PO TABS: 25.0000 mg | ORAL_TABLET | Freq: Every evening | ORAL | 0 refills | Status: DC | PRN

## 2020-01-13 NOTE — Telephone Encounter (Signed)
Pts needs a refill on Escitalopram and trazodone to be sent to [facility].

## 2020-01-17 ENCOUNTER — Ambulatory Visit: Payer: BC Managed Care – PPO | Admitting: Family Medicine

## 2020-01-17 ENCOUNTER — Other Ambulatory Visit (HOSPITAL_COMMUNITY)
Admission: RE | Admit: 2020-01-17 | Discharge: 2020-01-17 | Disposition: A | Discharge status: Home / Self Care | Payer: BC Managed Care – PPO | Source: Ambulatory Visit | Attending: Family Medicine | Admitting: Family Medicine

## 2020-01-17 ENCOUNTER — Encounter: Payer: Self-pay | Admitting: Family Medicine

## 2020-01-17 ENCOUNTER — Other Ambulatory Visit: Payer: Self-pay

## 2020-01-17 VITALS — BP 128/82 | HR 93 | Temp 98.2°F | Resp 18 | Ht 70.0 in | Wt 250.0 lb

## 2020-01-17 DIAGNOSIS — L0591 Pilonidal cyst without abscess: Secondary | ICD-10-CM | POA: Diagnosis not present

## 2020-01-17 DIAGNOSIS — Z23 Encounter for immunization: Secondary | ICD-10-CM | POA: Diagnosis not present

## 2020-01-17 DIAGNOSIS — L989 Disorder of the skin and subcutaneous tissue, unspecified: Secondary | ICD-10-CM

## 2020-01-17 DIAGNOSIS — F330 Major depressive disorder, recurrent, mild: Secondary | ICD-10-CM | POA: Diagnosis not present

## 2020-01-17 NOTE — Progress Notes (Signed)
Name: Gregory Matthews   MRN: [id number]    DOB: [birth year]   Date:01/17/2020       Progress Note  Subjective  Chief Complaint  Check skin tag/abnormal place on back  HPI    MDD/GAD: he is getting marital counseling and also solo sessions with the same therapist, no longer seeing Molli Hazard , he states therapy has been very helpful.  His Phq 9is much worse, he states anticipating going back to work .Heis still taking Depakote , taking 2 at night. He was diagnosed 05/2019 only tried Trintelix because he was afraid of possible sexual side effects of other medications on his last visit medication was switched to Lexapro and seems to be helping his mood. No side effects .   Cyst: he has a recurrent spot on gluteal folds that gets larger is size and very painful, occasionally with drainage that happens every couple of months, never seen a doctor for it. Discussed referring to surgeon, likely pilonidal cyst, he will contact me if it happens again   Skin Tag: he has a large skin tag on left inner thigh, it has been getting irritated when he wear pants, not bleeding, but is bothersome and would like to have it removed since it is growing and causing more problems   Patient Active Problem List   Diagnosis Date Noted  . Severe episode of recurrent major depressive disorder, without psychotic features (HCC) 07/17/2018  . Headache, migraine 06/08/2015  . Obesity (BMI 30.0-34.9) 06/08/2015  . Allergic rhinitis, seasonal 07/26/2013    Past Surgical History:  Procedure Laterality Date  . RHINOPLASTY    . TONSILLECTOMY      Family History  Problem Relation Age of Onset  . Cancer Mother        Breast  . Hypertension Father   . Heart disease Father   . Heart attack Father   . Cancer Maternal Aunt        Breast  . Hyperlipidemia Maternal Grandmother   . Parkinson's disease Paternal Grandfather     Social History   Tobacco Use  . Smoking status: Never Smoker  . Smokeless tobacco: Never  Used  Substance Use Topics  . Alcohol use: No    Alcohol/week: 0.0 standard drinks     Current Outpatient Medications:  .  amitriptyline (ELAVIL) 10 MG tablet, Take 1 tablet (10 mg total) by mouth at bedtime., Disp: 90 tablet, Rfl: 1 .  amphetamine-dextroamphetamine (ADDERALL) 10 MG tablet, Take 1 tablet (10 mg total) by mouth 2 (two) times daily., Disp: 60 tablet, Rfl: 0 .  divalproex (DEPAKOTE) 250 MG DR tablet, Take 1 tablet (250 mg total) by mouth 2 (two) times daily., Disp: 180 tablet, Rfl: 0 .  escitalopram (LEXAPRO) 10 MG tablet, Take 1 tablet (10 mg total) by mouth daily., Disp: 30 tablet, Rfl: 1 .  traZODone (DESYREL) 50 MG tablet, Take 0.5-1 tablets (25-50 mg total) by mouth at bedtime as needed for sleep., Disp: 30 tablet, Rfl: 0 .  Ubrogepant (UBRELVY) 100 MG TABS, Take 1 tablet by mouth daily as needed. (Patient not taking: Reported on 01/17/2020), Disp: 10 tablet, Rfl: 0  Allergies  Allergen Reactions  . Penicillins Rash    I personally reviewed active problem list, medication list, allergies, family history, social history, health maintenance with the patient/caregiver today.   ROS  Ten systems reviewed and is negative except as mentioned in HPI   Objective  Vitals:   01/17/20 1549  BP: 128/82  Pulse: 93  Resp: 18  Temp: 98.2 F (36.8 C)  TempSrc: Oral  SpO2: 98%  Weight: 250 lb (113.4 kg)  Height: 5\' 10"  (1.778 m)    Body mass index is 35.87 kg/m.  Physical Exam  Constitutional: Patient appears well-developed and well-nourished. Obese.  No distress.  HEENT: head atraumatic, normocephalic, pupils equal and reactive to light, neck supple Cardiovascular: Normal rate, regular rhythm and normal heart sounds.  No murmur heard. No BLE edema. Pulmonary/Chest: Effort normal and breath sounds normal. No respiratory distress.  Skin:  Abdominal: Soft.  There is no tenderness. Psychiatric: Patient has a normal mood and affect. behavior is normal. Judgment and  thought content normal.  PHQ2/9: Depression screen [facility] 2/9 01/17/2020 12/13/2019 11/08/2019 08/30/2019 07/05/2019  Decreased Interest 1 2 0 1 0  Down, Depressed, Hopeless 1 2 0 1 0  PHQ - 2 Score 2 4 0 2 0  Altered sleeping 2 2 2 2 2   Tired, decreased energy 1 3 1 1  0  Change in appetite 0 0 0 0 0  Feeling bad or failure about yourself  1 2 0 1 0  Trouble concentrating 1 1 0 1 0  Moving slowly or fidgety/restless 0 2 0 0 0  Suicidal thoughts 0 0 0 0 0  PHQ-9 Score 7 14 3 7 2   Difficult doing work/chores Somewhat difficult - Not difficult at all Somewhat difficult Not difficult at all  Some recent data might be hidden    phq 9 is positive  Fall Risk: Fall Risk  01/17/2020 12/13/2019 11/08/2019 08/30/2019 07/05/2019  Falls in the past year? 0 0 0 0 0  Number falls in past yr: 0 - 0 0 0  Injury with Fall? 0 - 0 0 0  Risk for fall due to : - - - - -  Follow up - - - Falls evaluation completed -    Functional Status Survey: Is the patient deaf or have difficulty hearing?: No Does the patient have difficulty seeing, even when wearing glasses/contacts?: No Does the patient have difficulty concentrating, remembering, or making decisions?: No Does the patient have difficulty walking or climbing stairs?: No Does the patient have difficulty dressing or bathing?: No Does the patient have difficulty doing errands alone such as visiting a doctor's office or shopping?: No    Assessment & Plan  1. Skin lesion of left leg  Consent signed: YES  Procedure: Skin Mass Removal Location: left inner thigh  Equipment used: derma-blade, high temperature cautery, sterile scalpel, tweezers, curved scissors Anesthesia: 1% Lidocaine w/o Epinephrine  Cleaned and prepped: Betadine  After consent signed, are of skin prepped with betadine. Lidocaine w/o epinephrine injected into skin underneath skin mass. After properly numbed sterile equipment used to remove tag.  Specimen sent for pathology analysis.  Instructed on proper care to allow for proper healing. F/U for nursing visit if needed.  2. Need for immunization against influenza  Today   3. Pilonidal cyst  He will contact me for referral when gets inflamed again   4. Mild episode of recurrent major depressive disorder (HCC)  Continue medication , seems to be improving

## 2020-01-17 NOTE — Patient Instructions (Addendum)
Skin Tag, Adult  A skin tag (acrochordon) is a soft, extra growth of skin. Most skin tags are flesh-colored and rarely bigger than a pencil eraser. They commonly form near areas where there are folds in the skin, such as the armpit or groin. Skin tags are not dangerous, and they do not spread from person to person (are not contagious). You may have one skin tag or several. Skin tags do not require treatment. However, your health care provider may recommend removal of a skin tag if it:  Gets irritated from clothing.  Bleeds.  Is visible and unsightly. Your health care provider can remove skin tags with a simple surgical procedure or a procedure that involves freezing the skin tag. Follow these instructions at home:  Watch for any changes in your skin tag. A normal skin tag does not require any other special care at home.  Take over-the-counter and prescription medicines only as told by your health care provider.  Keep all follow-up visits as told by your health care provider. This is important. Contact a health care provider if:  You have a skin tag that: ? Becomes painful. ? Changes color. ? Bleeds. ? Swells.  You develop more skin tags. This information is not intended to replace advice given to you by your health care provider. Make sure you discuss any questions you have with your health care provider. Document Revised: 03/24/2017 Document Reviewed: 04/26/2015 Elsevier Patient Education  2020 Else[employer] Inc[employer]  Pilonidal Cyst  A pilonidal cyst is a fluid-filled sac that forms beneath the skin near the tailbone, at the top of the crease of the buttocks (pilonidal area). If the cyst is not large and not infected, it may not cause any problems. If the cyst becomes irritated or infected, it may get larger and fill with pus. An infected cyst is called an abscess. A pilonidal abscess may cause pain and swelling, and it may need to be drained or removed. What are the causes? The cause of  this condition is not always known. In some cases, a hair that grows into your skin (ingrown hair) may be the cause. What increases the risk? You are more likely to get a pilonidal cyst if you:  Are male.  Have lots of hair near the crease of the buttocks.  Are overweight.  Have a dimple near the crease of the buttocks.  Wear tight clothing.  Do not bathe or shower often.  Sit for long periods of time. What are the signs or symptoms? Signs and symptoms of a pilonidal cyst may include pain, swelling, redness, and warmth in the pilonidal area. Depending on how big the cyst is, you may be able to feel a lump near your tailbone. If your cyst becomes infected, symptoms may include:  Pus or fluid drainage.  Fever.  Pain, swelling, and redness getting worse.  The lump getting bigger. How is this diagnosed? This condition may be diagnosed based on:  Your symptoms and medical history.  A physical exam.  A blood test to check for infection.  Testing a pus sample, if applicable. How is this treated? If your cyst does not cause symptoms, you may not need any treatment. If your cyst bothers you or is infected, you may need a procedure to drain or remove the cyst. Depending on the size, location, and severity of your cyst, your health care provider may:  Make an incision in the cyst and drain it (incision and drainage).  Open and drain the cyst,  and then stitch the wound so that it stays open while it heals (marsupialization). You will be given instructions about how to care for your open wound while it heals.  Remove all or part of the cyst, and then close the wound (cyst removal). You may need to take antibiotic medicines before your procedure. Follow these instructions at home: Medicines  Take over-the-counter and prescription medicines only as told by your health care provider.  If you were prescribed an antibiotic medicine, take it as told by your health care provider. Do  not stop taking the antibiotic even if you start to feel better. General instructions  Keep the area around your pilonidal cyst clean and dry.  If there is fluid or pus draining from your cyst: ? Cover the area with a clean bandage (dressing) as needed. ? Wash the area gently with soap and water. Pat the area dry with a clean towel. Do not rub the area because that may cause bleeding.  Remove hair from the area around the cyst only if your health care provider tells you to do this.  Do not wear tight pants or sit in one position for long periods at a time.  Keep all follow-up visits as told by your health care provider. This is important. Contact a health care provider if you have:  New redness, swelling, or pain.  A fever.  Severe pain. Summary  A pilonidal cyst is a fluid-filled sac that forms beneath the skin near the tailbone, at the top of the crease of the buttocks (pilonidal area).  If the cyst becomes irritated or infected, it may get larger and fill with pus. An infected cyst is called an abscess.  The cause of this condition is not always known. In some cases, a hair that grows into your skin (ingrown hair) may be the cause.  If your cyst does not cause symptoms, you may not need any treatment. If your cyst bothers you or is infected, you may need a procedure to drain or remove the cyst. This information is not intended to replace advice given to you by your health care provider. Make sure you discuss any questions you have with your health care provider. Document Revised: 03/30/2017 Document Reviewed: 03/30/2017 Elsevier Patient Education  2020 Else[employer] Inc.  Pilonidal Cyst  A pilonidal cyst is a fluid-filled sac that forms beneath the skin near the tailbone, at the top of the crease of the buttocks (pilonidal area). If the cyst is not large and not infected, it may not cause any problems. If the cyst becomes irritated or infected, it may get larger and fill with pus.  An infected cyst is called an abscess. A pilonidal abscess may cause pain and swelling, and it may need to be drained or removed. What are the causes? The cause of this condition is not always known. In some cases, a hair that grows into your skin (ingrown hair) may be the cause. What increases the risk? You are more likely to get a pilonidal cyst if you:  Are male.  Have lots of hair near the crease of the buttocks.  Are overweight.  Have a dimple near the crease of the buttocks.  Wear tight clothing.  Do not bathe or shower often.  Sit for long periods of time. What are the signs or symptoms? Signs and symptoms of a pilonidal cyst may include pain, swelling, redness, and warmth in the pilonidal area. Depending on how big the cyst is, you may be  able to feel a lump near your tailbone. If your cyst becomes infected, symptoms may include:  Pus or fluid drainage.  Fever.  Pain, swelling, and redness getting worse.  The lump getting bigger. How is this diagnosed? This condition may be diagnosed based on:  Your symptoms and medical history.  A physical exam.  A blood test to check for infection.  Testing a pus sample, if applicable. How is this treated? If your cyst does not cause symptoms, you may not need any treatment. If your cyst bothers you or is infected, you may need a procedure to drain or remove the cyst. Depending on the size, location, and severity of your cyst, your health care provider may:  Make an incision in the cyst and drain it (incision and drainage).  Open and drain the cyst, and then stitch the wound so that it stays open while it heals (marsupialization). You will be given instructions about how to care for your open wound while it heals.  Remove all or part of the cyst, and then close the wound (cyst removal). You may need to take antibiotic medicines before your procedure. Follow these instructions at home: Medicines  Take over-the-counter and  prescription medicines only as told by your health care provider.  If you were prescribed an antibiotic medicine, take it as told by your health care provider. Do not stop taking the antibiotic even if you start to feel better. General instructions  Keep the area around your pilonidal cyst clean and dry.  If there is fluid or pus draining from your cyst: ? Cover the area with a clean bandage (dressing) as needed. ? Wash the area gently with soap and water. Pat the area dry with a clean towel. Do not rub the area because that may cause bleeding.  Remove hair from the area around the cyst only if your health care provider tells you to do this.  Do not wear tight pants or sit in one position for long periods at a time.  Keep all follow-up visits as told by your health care provider. This is important. Contact a health care provider if you have:  New redness, swelling, or pain.  A fever.  Severe pain. Summary  A pilonidal cyst is a fluid-filled sac that forms beneath the skin near the tailbone, at the top of the crease of the buttocks (pilonidal area).  If the cyst becomes irritated or infected, it may get larger and fill with pus. An infected cyst is called an abscess.  The cause of this condition is not always known. In some cases, a hair that grows into your skin (ingrown hair) may be the cause.  If your cyst does not cause symptoms, you may not need any treatment. If your cyst bothers you or is infected, you may need a procedure to drain or remove the cyst. This information is not intended to replace advice given to you by your health care provider. Make sure you discuss any questions you have with your health care provider. Document Revised: 03/30/2017 Document Reviewed: 03/30/2017 Elsevier Patient Education  2020 [employer].

## 2020-01-21 LAB — SURGICAL PATHOLOGY

## 2020-02-17 ENCOUNTER — Other Ambulatory Visit: Payer: Self-pay | Admitting: Family Medicine

## 2020-02-17 DIAGNOSIS — F331 Major depressive disorder, recurrent, moderate: Secondary | ICD-10-CM

## 2020-02-17 DIAGNOSIS — F5104 Psychophysiologic insomnia: Secondary | ICD-10-CM

## 2020-02-17 NOTE — Telephone Encounter (Signed)
Requested Prescriptions  Pending Prescriptions Disp Refills  . traZODone (DESYREL) 50 MG tablet [Pharmacy Med Name: TRAZODONE 50MG  TABLETS] 30 tablet 0    Sig: TAKE 1/2 TO 1 TABLET(25 TO 50 MG) BY MOUTH AT BEDTIME AS NEEDED FOR SLEEP     Psychiatry: Antidepressants - Serotonin Modulator Passed - 02/17/2020  3:25 PM      Passed - Completed PHQ-2 or PHQ-9 in the last 360 days.      Passed - Valid encounter within last 6 months    Recent Outpatient Visits          1 month ago Skin lesion of left leg   [facility] [place], [place], MD   2 months ago Moderate episode of recurrent major depressive disorder [facility])   [facility], MD   3 months ago Upper respiratory tract infection, unspecified type   [facility], PA-C   5 months ago Moderate episode of recurrent major depressive disorder [facility])   [facility], MD   7 months ago Knee pain, right anterior   [facility] D, MD             . escitalopram (LEXAPRO) 10 MG tablet [Pharmacy Med Name: ESCITALOPRAM 10MG  TABLETS] 30 tablet 2    Sig: TAKE 1 TABLET(10 MG) BY MOUTH DAILY     Psychiatry:  Antidepressants - SSRI Passed - 02/17/2020  3:25 PM      Passed - Completed PHQ-2 or PHQ-9 in the last 360 days.      Passed - Valid encounter within last 6 months    Recent Outpatient Visits          1 month ago Skin lesion of left leg   [facility], [facility], MD   2 months ago Moderate episode of recurrent major depressive disorder [facility])   [facility], MD   3 months ago Upper respiratory tract infection, unspecified type   [facility], PA-C   5 months ago Moderate episode of recurrent major depressive disorder [facility])   [facility], MD   7 months ago Knee  pain, right anterior   [facility] [place], MD

## 2020-02-28 ENCOUNTER — Telehealth: Payer: Self-pay

## 2020-02-28 ENCOUNTER — Other Ambulatory Visit: Payer: Self-pay

## 2020-02-28 DIAGNOSIS — F330 Major depressive disorder, recurrent, mild: Secondary | ICD-10-CM

## 2020-02-28 MED ORDER — AMPHETAMINE-DEXTROAMPHETAMINE 10 MG PO TABS: 10.0000 mg | ORAL_TABLET | Freq: Two times a day (BID) | ORAL | 0 refills | Status: DC

## 2020-02-28 NOTE — Telephone Encounter (Signed)
Pt needs refill on adderell to be sent to his pharmacy

## 2020-03-23 ENCOUNTER — Other Ambulatory Visit: Payer: Self-pay

## 2020-03-23 ENCOUNTER — Ambulatory Visit: Payer: BC Managed Care – PPO | Admitting: Family Medicine

## 2020-03-23 ENCOUNTER — Encounter: Payer: Self-pay | Admitting: Family Medicine

## 2020-03-23 VITALS — BP 136/82 | HR 87 | Temp 97.9°F | Resp 16 | Ht 70.0 in | Wt 248.1 lb

## 2020-03-23 DIAGNOSIS — F330 Major depressive disorder, recurrent, mild: Secondary | ICD-10-CM

## 2020-03-23 DIAGNOSIS — F411 Generalized anxiety disorder: Secondary | ICD-10-CM

## 2020-03-23 DIAGNOSIS — G43009 Migraine without aura, not intractable, without status migrainosus: Secondary | ICD-10-CM | POA: Diagnosis not present

## 2020-03-23 DIAGNOSIS — F5104 Psychophysiologic insomnia: Secondary | ICD-10-CM

## 2020-03-23 DIAGNOSIS — F331 Major depressive disorder, recurrent, moderate: Secondary | ICD-10-CM

## 2020-03-23 DIAGNOSIS — L0591 Pilonidal cyst without abscess: Secondary | ICD-10-CM | POA: Diagnosis not present

## 2020-03-23 MED ORDER — TEMAZEPAM 15 MG PO CAPS: 15.0000 mg | ORAL_CAPSULE | Freq: Every evening | ORAL | 0 refills | Status: DC | PRN

## 2020-03-23 MED ORDER — ESCITALOPRAM OXALATE 10 MG PO TABS: 10.0000 mg | ORAL_TABLET | Freq: Every day | ORAL | 1 refills | Status: DC

## 2020-03-23 MED ORDER — AMPHETAMINE-DEXTROAMPHETAMINE 10 MG PO TABS: 10.0000 mg | ORAL_TABLET | Freq: Two times a day (BID) | ORAL | 0 refills | Status: DC

## 2020-03-23 MED ORDER — DIVALPROEX SODIUM 250 MG PO DR TAB: 250.0000 mg | DELAYED_RELEASE_TABLET | Freq: Two times a day (BID) | ORAL | 0 refills | Status: DC

## 2020-03-23 NOTE — Progress Notes (Signed)
Name: Gregory Matthews   MRN: [id number]    DOB: 16-Feb-1981   Date:03/23/2020       Progress Note  Subjective  Chief Complaint  Follow up  HPI   MDD/GAD:diagnosed 05/2019  he is getting marital counseling and also solo sessions with the same therapist, no longer seeing Molli Hazard , he states therapy has been very helpful.  His Phq 9has improved. .Heis still taking Depakote twice a day and seems to help his mood. He was initially on Trintelix in Feb because he was afraid of possible side effects of other SSRI's, we switched to Lexapro this past Summer because Trintelix was no longer helping and he is now doing well on Lexapro. He states he has been busy at home, cooking most nights ( wife is on post-graduate school), he is walking his dog, being more active - his watch tracks his level of activity 15-30 daily, 10 hours standing on average and burns about 800 calories  Insomnia:he has tried Hydroxizine and Trazodone but did not work well for him,  He has difficulty falling asleep but able to stay asleep. We will try temazepam  Migraine:We have tried Topamax and Imitrex. He has stopped both medication secondary to side effects. Topamax he stopped because of change in taste and Imitrex, because flushing sensation and chest tightness. Episodes of migraine are described as a dull headache, followed by nausea andthe pain intensifies and becomes throbbingorpounding sensation in different parts of his head. It is associated with photophobia, phonophobia and movement - such as walking.He stopped Nortriptyline last Fall and episodes increased again to 3-4 a week, back on medication since Feb 2021 he states episodes are about twice a week. He states seems to be happening in the mornings, he states worse if he does not take caffeine. He is drinking 3 cans of Dr. Reino Kent  sodas daily, not drinking coffee daily . Discussed cutting down on caffeine and drinking more water. He did not fill rx of Ubrelvy , he  states Excedrin Migraine works well for him   Cyst: he has a recurrent spot on gluteal folds that gets larger is size and very painful, occasionally with drainage that happens every couple of months, never seen a doctor for it. Discussed referring to surgeon, likely pilonidal cyst, he had a flare but he will wait until next year to get more money in his flex spending account.    Patient Active Problem List   Diagnosis Date Noted   Severe episode of recurrent major depressive disorder, without psychotic features (HCC) 07/17/2018   Headache, migraine 06/08/2015   Obesity (BMI 30.0-34.9) 06/08/2015   Allergic rhinitis, seasonal 07/26/2013    Past Surgical History:  Procedure Laterality Date   RHINOPLASTY     TONSILLECTOMY      Family History  Problem Relation Age of Onset   Cancer Mother        Breast   Hypertension Father    Heart disease Father    Heart attack Father    Cancer Maternal Aunt        Breast   Hyperlipidemia Maternal Grandmother    Parkinson's disease Paternal Grandfather     Social History   Tobacco Use   Smoking status: Never Smoker   Smokeless tobacco: Never Used  Substance Use Topics   Alcohol use: No    Alcohol/week: 0.0 standard drinks     Current Outpatient Medications:    amitriptyline (ELAVIL) 10 MG tablet, Take 1 tablet (10 mg total) by mouth  at bedtime., Disp: 90 tablet, Rfl: 1   amphetamine-dextroamphetamine (ADDERALL) 10 MG tablet, Take 1 tablet (10 mg total) by mouth 2 (two) times daily., Disp: 60 tablet, Rfl: 0   divalproex (DEPAKOTE) 250 MG DR tablet, Take 1 tablet (250 mg total) by mouth 2 (two) times daily., Disp: 180 tablet, Rfl: 0   escitalopram (LEXAPRO) 10 MG tablet, TAKE 1 TABLET(10 MG) BY MOUTH DAILY, Disp: 30 tablet, Rfl: 2   traZODone (DESYREL) 50 MG tablet, TAKE 1/2 TO 1 TABLET(25 TO 50 MG) BY MOUTH AT BEDTIME AS NEEDED FOR SLEEP, Disp: 30 tablet, Rfl: 0  Allergies  Allergen Reactions   Penicillins Rash     I personally reviewed active problem list, medication list, allergies, family history, social history, health maintenance with the patient/caregiver today.   ROS  Constitutional: Negative for fever or weight change.  Respiratory: Negative for cough and shortness of breath.   Cardiovascular: Negative for chest pain or palpitations.  Gastrointestinal: Negative for abdominal pain, no bowel changes.  Musculoskeletal: Negative for gait problem or joint swelling.  Skin: Negative for rash.  Neurological: Negative for dizziness, positive for headache.  No other specific complaints in a complete review of systems (except as listed in HPI above).  Objective  Vitals:   03/23/20 1553  BP: 136/82  Pulse: 87  Resp: 16  Temp: 97.9 F (36.6 C)  TempSrc: Oral  SpO2: 99%  Weight: 248 lb 1.6 oz (112.5 kg)  Height: 5\' 10"  (1.778 m)    Body mass index is 35.6 kg/m.  Physical Exam  Constitutional: Patient appears well-developed and well-nourished. Obese  No distress.  HEENT: head atraumatic, normocephalic, pupils equal and reactive to light,  neck supple Cardiovascular: Normal rate, regular rhythm and normal heart sounds.  No murmur heard. No BLE edema. Pulmonary/Chest: Effort normal and breath sounds normal. No respiratory distress. Abdominal: Soft.  There is no tenderness. Psychiatric: Patient has a normal mood and affect. behavior is normal. Judgment and thought content normal.  Recent Results (from the past 2160 hour(s))  Surgical pathology     Status: None   Collection Time: 01/17/20  4:24 PM  Result Value Ref Range   SURGICAL PATHOLOGY      SURGICAL PATHOLOGY CASE: [id number] PATIENT: Kahne Mehlberg Surgical Pathology Report     Clinical History: skin lesion of left leg (cm)     FINAL MICROSCOPIC DIAGNOSIS:  A. SKIN TAG, LEFT INNER THIGH, EXCISION: - Benign fibroepithelial polyp (skin tag)    GROSS DESCRIPTION:  Received formalin is a 1 x 0.7 x 0.5 cm  wrinkled tan portion of skin, bisected and submitted in 1 cassette.  (AK 01/20/2020)    Final Diagnosis performed by 01/22/2020, MD.   Electronically signed 01/21/2020 Technical component performed at [facility]. [facility], [address] [place], [place].  Professional component performed at [facility], [address], [place], [place].  Immunohistochemistry Technical component (if applicable) was performed at [facility]. [address], [place], [place].   IMMUNOHISTOCHEMISTRY DISCLAIMER (if applicable): Some of these immunohistochemical stain s may have been developed and the performance characteristics determine by [facility]. Some may not have been cleared or approved by the U.S. Food and Drug Administration. The FDA has determined that such clearance or approval is not necessary. This test is used for clinical purposes. It should not be regarded as investigational or for research. This laboratory is certified under the Clinical Laboratory Improvement  Amendments of 1988 (CLIA-88) as qualified to perform high complexity clinical laboratory testing.  The controls stained appropriately.      PHQ2/9: Depression screen [facility] 2/9 03/23/2020 01/17/2020 12/13/2019 11/08/2019 08/30/2019  Decreased Interest 1 1 2  0 1  Down, Depressed, Hopeless 0 1 2 0 1  PHQ - 2 Score 1 2 4  0 2  Altered sleeping 1 2 2 2 2   Tired, decreased energy 1 1 3 1 1   Change in appetite 0 0 0 0 0  Feeling bad or failure about yourself  0 1 2 0 1  Trouble concentrating 1 1 1  0 1  Moving slowly or fidgety/restless 0 0 2 0 0  Suicidal thoughts 0 0 0 0 0  PHQ-9 Score 4 7 14 3 7   Difficult doing work/chores Somewhat difficult Somewhat difficult - Not difficult at all Somewhat difficult  Some recent data might be hidden    phq 9 is positive   Fall Risk: Fall Risk  03/23/2020 01/17/2020 12/13/2019 11/08/2019  08/30/2019  Falls in the past year? 0 0 0 0 0  Number falls in past yr: 0 0 - 0 0  Injury with Fall? 0 0 - 0 0  Risk for fall due to : - - - - -  Follow up - - - - Falls evaluation completed     Functional Status Survey: Is the patient deaf or have difficulty hearing?: No Does the patient have difficulty seeing, even when wearing glasses/contacts?: No Does the patient have difficulty concentrating, remembering, or making decisions?: No Does the patient have difficulty walking or climbing stairs?: No Does the patient have difficulty dressing or bathing?: No Does the patient have difficulty doing errands alone such as visiting a doctor's office or shopping?: No    Assessment & Plan  .1. Pilonidal cyst  - Ambulatory referral to General Surgery  2. GAD (generalized anxiety disorder)   3. Migraine without aura and without status migrainosus, not intractable  - divalproex (DEPAKOTE) 250 MG DR tablet; Take 1 tablet (250 mg total) by mouth 2 (two) times daily.  Dispense: 180 tablet; Refill: 0  4. Psychophysiological insomnia  - temazepam (RESTORIL) 15 MG capsule; Take 1 capsule (15 mg total) by mouth at bedtime as needed for sleep.  Dispense: 30 capsule; Refill: 0  5. Mild episode of recurrent major depressive disorder (HCC)  - amphetamine-dextroamphetamine (ADDERALL) 10 MG tablet; Take 1 tablet (10 mg total) by mouth 2 (two) times daily.  Dispense: 180 tablet; Refill: 0  6. Moderate episode of recurrent major depressive disorder (HCC)  - escitalopram (LEXAPRO) 10 MG tablet; Take 1 tablet (10 mg total) by mouth daily.  Dispense: 90 tablet; Refill: 1

## 2020-03-26 ENCOUNTER — Ambulatory Visit (INDEPENDENT_AMBULATORY_CARE_PROVIDER_SITE_OTHER): Payer: BC Managed Care – PPO | Admitting: General Surgery

## 2020-03-26 ENCOUNTER — Other Ambulatory Visit: Payer: Self-pay

## 2020-03-26 ENCOUNTER — Encounter: Payer: Self-pay | Admitting: General Surgery

## 2020-03-26 VITALS — BP 146/84 | HR 76 | Temp 98.1°F | Ht 70.0 in | Wt 247.0 lb

## 2020-03-26 DIAGNOSIS — L0591 Pilonidal cyst without abscess: Secondary | ICD-10-CM

## 2020-03-26 NOTE — Patient Instructions (Addendum)
You may try hair removal products Darene Lamer) to reduce hair involvement from the pits. If you have any questions or concerns, feel free to call our office.   Pilonidal Cyst  A pilonidal cyst is a fluid-filled sac that forms beneath the skin near the tailbone, at the top of the crease of the buttocks (pilonidal area). If the cyst is not large and not infected, it may not cause any problems. If the cyst becomes irritated or infected, it may get larger and fill with pus. An infected cyst is called an abscess. A pilonidal abscess may cause pain and swelling, and it may need to be drained or removed. What are the causes? The cause of this condition is not always known. In some cases, a hair that grows into your skin (ingrown hair) may be the cause. What increases the risk? You are more likely to get a pilonidal cyst if you:  Are male.  Have lots of hair near the crease of the buttocks.  Are overweight.  Have a dimple near the crease of the buttocks.  Wear tight clothing.  Do not bathe or shower often.  Sit for long periods of time. What are the signs or symptoms? Signs and symptoms of a pilonidal cyst may include pain, swelling, redness, and warmth in the pilonidal area. Depending on how big the cyst is, you may be able to feel a lump near your tailbone. If your cyst becomes infected, symptoms may include:  Pus or fluid drainage.  Fever.  Pain, swelling, and redness getting worse.  The lump getting bigger. How is this diagnosed? This condition may be diagnosed based on:  Your symptoms and medical history.  A physical exam.  A blood test to check for infection.  Testing a pus sample, if applicable. How is this treated? If your cyst does not cause symptoms, you may not need any treatment. If your cyst bothers you or is infected, you may need a procedure to drain or remove the cyst. Depending on the size, location, and severity of your cyst, your health care provider may:  Make  an incision in the cyst and drain it (incision and drainage).  Open and drain the cyst, and then stitch the wound so that it stays open while it heals (marsupialization). You will be given instructions about how to care for your open wound while it heals.  Remove all or part of the cyst, and then close the wound (cyst removal). You may need to take antibiotic medicines before your procedure. Follow these instructions at home: Medicines  Take over-the-counter and prescription medicines only as told by your health care provider.  If you were prescribed an antibiotic medicine, take it as told by your health care provider. Do not stop taking the antibiotic even if you start to feel better. General instructions  Keep the area around your pilonidal cyst clean and dry.  If there is fluid or pus draining from your cyst: ? Cover the area with a clean bandage (dressing) as needed. ? Wash the area gently with soap and water. Pat the area dry with a clean towel. Do not rub the area because that may cause bleeding.  Remove hair from the area around the cyst only if your health care provider tells you to do this.  Do not wear tight pants or sit in one position for long periods at a time.  Keep all follow-up visits as told by your health care provider. This is important. Contact a health  care provider if you have:  New redness, swelling, or pain.  A fever.  Severe pain. Summary  A pilonidal cyst is a fluid-filled sac that forms beneath the skin near the tailbone, at the top of the crease of the buttocks (pilonidal area).  If the cyst becomes irritated or infected, it may get larger and fill with pus. An infected cyst is called an abscess.  The cause of this condition is not always known. In some cases, a hair that grows into your skin (ingrown hair) may be the cause.  If your cyst does not cause symptoms, you may not need any treatment. If your cyst bothers you or is infected, you may need a  procedure to drain or remove the cyst. This information is not intended to replace advice given to you by your health care provider. Make sure you discuss any questions you have with your health care provider. Document Revised: 03/30/2017 Document Reviewed: 03/30/2017 Elsevier Patient Education  2020 [employer].

## 2020-03-26 NOTE — Progress Notes (Signed)
Patient ID: Gregory Matthews, male   DOB: 1980/12/03, 39 y.o.   MRN: [id number]  Chief Complaint  Patient presents with  . New Patient (Initial Visit)    pilonidal cyst    HPI Gregory Matthews is a 39 y.o. male. Been referred by Dr. Carlynn Purl for evaluation of a pilonidal cyst.  He reports having had it for several years.  He says that it waxes and wanes.  It will swell and then spontaneously drain.  It is painful when it flares up.  He reports that the drainage is not malodorous.  He denies having associated nausea, vomiting, fever, or chills when the area is inflamed.  He has not taken any antibiotics for this.  He is interested in more definitive management.   Past Medical History:  Diagnosis Date  . Acute pharyngitis   . Hay fever   . Migraine   . Sinusitis, acute     Past Surgical History:  Procedure Laterality Date  . HAIR TRANSPLANT    . RHINOPLASTY    . TONSILLECTOMY      Family History  Problem Relation Age of Onset  . Cancer Mother        Breast  . Hypertension Father   . Heart disease Father   . Heart attack Father   . Cancer Maternal Aunt        Breast  . Hyperlipidemia Maternal Grandmother   . Parkinson's disease Paternal Grandfather     Social History Social History   Tobacco Use  . Smoking status: Never Smoker  . Smokeless tobacco: Never Used  Vaping Use  . Vaping Use: Never used  Substance Use Topics  . Alcohol use: No    Alcohol/week: 0.0 standard drinks  . Drug use: No    Allergies  Allergen Reactions  . Penicillins Rash    Current Outpatient Medications  Medication Sig Dispense Refill  . amitriptyline (ELAVIL) 10 MG tablet Take 1 tablet (10 mg total) by mouth at bedtime. 90 tablet 1  . amphetamine-dextroamphetamine (ADDERALL) 10 MG tablet Take 1 tablet (10 mg total) by mouth 2 (two) times daily. 180 tablet 0  . divalproex (DEPAKOTE) 250 MG DR tablet Take 1 tablet (250 mg total) by mouth 2 (two) times daily. 180 tablet 0  . escitalopram  (LEXAPRO) 10 MG tablet Take 1 tablet (10 mg total) by mouth daily. 90 tablet 1  . temazepam (RESTORIL) 15 MG capsule Take 1 capsule (15 mg total) by mouth at bedtime as needed for sleep. 30 capsule 0   No current facility-administered medications for this visit.    Review of Systems Review of Systems  Psychiatric/Behavioral: Positive for dysphoric mood.  All other systems reviewed and are negative.   Blood pressure (!) 146/84, pulse 76, temperature 98.1 F (36.7 C), temperature source Oral, height 5\' 10"  (1.778 m), weight 247 lb (112 kg), SpO2 96 %. Body mass index is 35.44 kg/m.  Physical Exam Physical Exam Vitals reviewed. Exam conducted with a chaperone present.  Constitutional:      General: He is not in acute distress.    Appearance: He is obese.  HENT:     Head: Normocephalic.     Comments: Scar along the occiput from hair transplant.    Nose:     Comments: Covered with a mask    Mouth/Throat:     Comments: Covered with a mask Eyes:     General: No scleral icterus.       Right eye: No discharge.  Left eye: No discharge.  Neck:     Comments: No dominant thyroid masses or thyromegaly appreciated.  The gland moves freely with deglutition. Cardiovascular:     Rate and Rhythm: Normal rate and regular rhythm.     Pulses: Normal pulses.     Heart sounds: No murmur heard.   Pulmonary:     Effort: Pulmonary effort is normal. No respiratory distress.     Breath sounds: Normal breath sounds.  Abdominal:     General: Bowel sounds are normal.     Palpations: Abdomen is soft.     Comments: Protuberant, consistent with his level of obesity.  Genitourinary:    Comments: Deferred Musculoskeletal:        General: No swelling or tenderness.     Cervical back: No rigidity.  Lymphadenopathy:     Cervical: No cervical adenopathy.  Skin:    General: Skin is warm and dry.          Comments: Deep natal cleft with extensive hair.  There is a single pit with a hair  protruding from it.  This was removed.  A small amount of whitish drainage was expressed.  Just to the left is a small, healing area that appears to be consistent with a drainage site.  Neurological:     General: No focal deficit present.     Mental Status: He is alert and oriented to person, place, and time.  Psychiatric:        Mood and Affect: Mood normal.        Behavior: Behavior normal.     Data Reviewed Reviewed number of clinic notes from Dr. Carlynn Purl.  The majority of these address psychiatric issues.  A note from January 17, 2020 does mention a pilonidal cyst and that the patient would contact her if it were to become inflamed again.  At the more recent clinic visit, on March 23, 2020, she describes a cyst, "a recurrent spot on his gluteal folds that gets larger in size and is very painful, occasionally with drainage that happens every couple of months, never seen a doctor for it.  Discussed referring to surgeon, likely pilonidal cyst, he had a flare but he will wait until next year to get more money in his flex spending account."  Assessment This is a 39 year old man with a pilonidal cyst.  It is currently not inflamed.  He does not report ever having had an abscess in the area but that it does wax and wane and sometimes spontaneously drains nonpurulent, nonmalodorous fluid.  On physical examination today, he does have a deep natal cleft and a single pit.  I removed a hair from that pit.  Plan I had an extensive discussion with Gregory Matthews about the various treatments available for pilonidal disease.  The most conservative of these would be to maintain a hair free natal cleft using a depilatory agent and monitor for recurrence.  The next step of invasiveness would likely be a "pit picking" procedure where the single pit is cored out and allowed to heal.  The most recent data suggest that wide local excisions closing by secondary intention are probably not the ideal management and that  flap closure with an off-midline suture line (a cleft lift procedure) would be the best definitive treatment, if he fails conservative or pit picking treatment.  I do not perform this procedure.  My office is in the process of identifying surgeons who could potentially perform this operation.  It appears that  there is a Careers adviser in [place] who may offer this.  We are also in communication with several other surgical practices, both general and plastics to try and determine if this would be an option for him, should it come to that.  For now, he would like to pursue the most conservative intervention, namely keeping the area hair free and he will monitor for recurrence.  He will contact me should this fail and he desire a more aggressive approach.  I spent greater than 50% of a 45-minute appointment with this patient counseling and coordinating his care.    Duanne Guess 03/26/2020, 4:11 PM

## 2020-04-29 ENCOUNTER — Telehealth (INDEPENDENT_AMBULATORY_CARE_PROVIDER_SITE_OTHER): Payer: BC Managed Care – PPO | Admitting: Family Medicine

## 2020-04-29 ENCOUNTER — Other Ambulatory Visit: Payer: Self-pay

## 2020-04-29 ENCOUNTER — Encounter: Payer: Self-pay | Admitting: Family Medicine

## 2020-04-29 DIAGNOSIS — J029 Acute pharyngitis, unspecified: Secondary | ICD-10-CM | POA: Diagnosis not present

## 2020-04-29 DIAGNOSIS — R059 Cough, unspecified: Secondary | ICD-10-CM | POA: Diagnosis not present

## 2020-04-29 DIAGNOSIS — U071 COVID-19: Secondary | ICD-10-CM | POA: Diagnosis not present

## 2020-04-29 MED ORDER — GUAIFENESIN-CODEINE 100-10 MG/5ML PO SOLN: 10.0000 mL | Freq: Three times a day (TID) | ORAL | 0 refills | Status: DC | PRN

## 2020-04-29 MED ORDER — MAGIC MOUTHWASH W/LIDOCAINE: 5.0000 mL | Freq: Three times a day (TID) | ORAL | 0 refills | Status: DC

## 2020-04-29 NOTE — Progress Notes (Signed)
Name: Gregory Matthews   MRN: [id number]    DOB: [birth year]   Date:04/29/2020       Progress Note  Subjective  Chief Complaint  Chief Complaint  Patient presents with  . Covid Positive    Sore throat, headache, cough, fever, chills for 3 days    I connected with  Gerson Fauth Veals on 04/29/20 at  3:40 PM EST by telephone and verified that I am speaking with the correct person using two identifiers.   I discussed the limitations, risks, security and privacy concerns of performing an evaluation and management service by telephone and the availability of in person appointments. Staff also discussed with the patient that there may be a patient responsible charge related to this service. Patient Location: at home  Provider Location: [facility] Additional Individuals present: wife   HPI  COVID-19 positive: wife returned from [place] last week and developed COVID-19 symptoms on Saturday am, two days later he developed severe sore throat, headache, fatigue, chills and fever. Appetite is normal, he has been able to drink fluids. No nausea, vomiting or diarrhea.   Patient Active Problem List   Diagnosis Date Noted  . Severe episode of recurrent major depressive disorder, without psychotic features (HCC) 07/17/2018  . Headache, migraine 06/08/2015  . Obesity (BMI 30.0-34.9) 06/08/2015  . Allergic rhinitis, seasonal 07/26/2013    Social History   Tobacco Use  . Smoking status: Never Smoker  . Smokeless tobacco: Never Used  Substance Use Topics  . Alcohol use: No    Alcohol/week: 0.0 standard drinks     Current Outpatient Medications:  .  amitriptyline (ELAVIL) 10 MG tablet, Take 1 tablet (10 mg total) by mouth at bedtime., Disp: 90 tablet, Rfl: 1 .  amphetamine-dextroamphetamine (ADDERALL) 10 MG tablet, Take 1 tablet (10 mg total) by mouth 2 (two) times daily., Disp: 180 tablet, Rfl: 0 .  divalproex (DEPAKOTE) 250 MG DR tablet, Take 1 tablet (250 mg total) by mouth 2 (two) times daily.,  Disp: 180 tablet, Rfl: 0 .  escitalopram (LEXAPRO) 10 MG tablet, Take 1 tablet (10 mg total) by mouth daily., Disp: 90 tablet, Rfl: 1 .  temazepam (RESTORIL) 15 MG capsule, Take 1 capsule (15 mg total) by mouth at bedtime as needed for sleep., Disp: 30 capsule, Rfl: 0  Allergies  Allergen Reactions  . Penicillins Rash    I personally reviewed active problem list, medication list, allergies, family history, social history with the patient/caregiver today.  ROS  Ten systems reviewed and is negative except as mentioned in HPI   Objective  Virtual encounter, vitals not obtained.  There is no height or weight on file to calculate BMI.  Nursing Note and Vital Signs reviewed.  Physical Exam  Awake, alert and oriented , coughing   Assessment & Plan  1. COVID-19  Discussed isolation, fluids, rest, get pulse ox and go to [facility] if below 92 %. He did not want home monitoring, he will reach back if needed   2. Sore throat  - magic mouthwash w/lidocaine SOLN; Take 5 mLs by mouth 4 (four) times daily -  before meals and at bedtime.  Dispense: 120 mL; Refill: 0  3. Cough  - guaiFENesin-codeine 100-10 MG/5ML syrup; Take 10 mLs by mouth 3 (three) times daily as needed for cough.  Dispense: 120 mL; Refill: 0  Advised to only take it during the day to avoid respiratory suppression, stay hydrated, take mucinex prn, get a pulse ox and go to [facility] if  below 92 %  -Red flags and when to present for emergency care or RTC including fever >101.37F, chest pain, shortness of breath, new/worsening/un-resolving symptoms, reviewed with patient at time of visit. Follow up and care instructions discussed and provided in AVS. - I discussed the assessment and treatment plan with the patient. The patient was provided an opportunity to ask questions and all were answered. The patient agreed with the plan and demonstrated an understanding of the instructions.  - The patient was advised to call back or seek an  in-person evaluation if the symptoms worsen or if the condition fails to improve as anticipated.  I provided 15  minutes of non-face-to-face time during this encounter.  Ruel Favors, MD

## 2020-05-08 ENCOUNTER — Telehealth (INDEPENDENT_AMBULATORY_CARE_PROVIDER_SITE_OTHER): Payer: BC Managed Care – PPO | Admitting: Family Medicine

## 2020-05-08 ENCOUNTER — Ambulatory Visit
Admission: RE | Admit: 2020-05-08 | Discharge: 2020-05-08 | Disposition: A | Discharge status: Home / Self Care | Payer: Self-pay | Attending: Family Medicine | Admitting: Family Medicine

## 2020-05-08 ENCOUNTER — Ambulatory Visit
Admission: RE | Admit: 2020-05-08 | Discharge: 2020-05-08 | Disposition: A | Discharge status: Home / Self Care | Payer: Self-pay | Source: Ambulatory Visit | Attending: Family Medicine | Admitting: Family Medicine

## 2020-05-08 ENCOUNTER — Other Ambulatory Visit: Payer: Self-pay

## 2020-05-08 ENCOUNTER — Encounter: Payer: Self-pay | Admitting: Family Medicine

## 2020-05-08 DIAGNOSIS — R059 Cough, unspecified: Secondary | ICD-10-CM

## 2020-05-08 DIAGNOSIS — U071 COVID-19: Secondary | ICD-10-CM

## 2020-05-08 DIAGNOSIS — R0781 Pleurodynia: Secondary | ICD-10-CM | POA: Diagnosis not present

## 2020-05-08 MED ORDER — PREDNISONE 10 MG PO TABS: 10.0000 mg | ORAL_TABLET | Freq: Two times a day (BID) | ORAL | 0 refills | Status: DC

## 2020-05-08 NOTE — Progress Notes (Signed)
Name: Gregory Matthews   MRN: [id number]    DOB: 04/14/1981   Date:05/08/2020       Progress Note  Subjective  Chief Complaint  Chest Xray Order  I connected with  Gregory Matthews  on 05/08/20 at  1:20 PM EST by a video enabled telemedicine application and verified that I am speaking with the correct person using two identifiers.  I discussed the limitations of evaluation and management by telemedicine and the availability of in person appointments. The patient expressed understanding and agreed to proceed with the virtual visit  Staff also discussed with the patient that there may be a patient responsible charge related to this service. Patient Location: at work  Provider Location: [facility]  Additional Individuals present: none   HPI  COVI-19: he was diagnosed about 10 days ago, visit with me on 04/29/2020, he is concerned because he is still having a significant cough and recently also noticing anterior chest tightness that is worse with movement of chest wall and also with deep inspiration. He has been afebrile, denies sob but is feeling very fatigued and sleeping more than usual. Unable to check pulse ox at home. He has been back to work, but had to take Wednesday off due to severe fatigue. 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 He had a CXR, I reviewed images and it looks negative for pneumonia but explained to him I will call in antibiotics if radiologist interpreters it differently  Patient Active Problem List   Diagnosis Date Noted  . Severe episode of recurrent major depressive disorder, without psychotic features (HCC) 07/17/2018  . Headache, migraine 06/08/2015  . Obesity (BMI 30.0-34.9) 06/08/2015  . Allergic rhinitis, seasonal 07/26/2013    Past Surgical History:  Procedure Laterality Date  . HAIR TRANSPLANT    . RHINOPLASTY    . TONSILLECTOMY      Family History  Problem Relation Age of Onset  . Cancer Mother        Breast  . Hypertension Father    . Heart disease Father   . Heart attack Father   . Cancer Maternal Aunt        Breast  . Hyperlipidemia Maternal Grandmother   . Parkinson's disease Paternal Grandfather     Social History   Socioeconomic History  . Marital status: Married    Spouse name: Morrie Sheldon   . Number of children: 0  . Years of education: Not on file  . Highest education level: Bachelor's degree (e.g., BA, AB, BS)  Occupational History  . Occupation: Runner, broadcasting/film/video   Tobacco Use  . Smoking status: Never Smoker  . Smokeless tobacco: Never Used  Vaping Use  . Vaping Use: Never used  Substance and Sexual Activity  . Alcohol use: No    Alcohol/week: 0.0 standard drinks  . Drug use: No  . Sexual activity: Yes    Partners: Female  Other Topics Concern  . Not on file  Social History Narrative   Married, he is a Manufacturing engineer Strain: Not on file  Food Insecurity: No Food Insecurity  . Worried About Programme researcher, broadcasting/film/video in the Last Year: Never true  . Ran Out of Food in the Last Year: Never true  Transportation Needs: Not on file  Physical Activity: Insufficiently Active  . Days of Exercise per Week: 2 days  . Minutes of Exercise per Session: 60 min  Stress: No Stress  Concern Present  . Feeling of Stress : Only a little  Social Connections: Not on file  Intimate Partner Violence: Not on file     Current Outpatient Medications:  .  amitriptyline (ELAVIL) 10 MG tablet, Take 1 tablet (10 mg total) by mouth at bedtime., Disp: 90 tablet, Rfl: 1 .  amphetamine-dextroamphetamine (ADDERALL) 10 MG tablet, Take 1 tablet (10 mg total) by mouth 2 (two) times daily., Disp: 180 tablet, Rfl: 0 .  divalproex (DEPAKOTE) 250 MG DR tablet, Take 1 tablet (250 mg total) by mouth 2 (two) times daily., Disp: 180 tablet, Rfl: 0 .  escitalopram (LEXAPRO) 10 MG tablet, Take 1 tablet (10 mg total) by mouth daily., Disp: 90 tablet, Rfl: 1 .  guaiFENesin-codeine 100-10 MG/5ML syrup,  Take 10 mLs by mouth 3 (three) times daily as needed for cough., Disp: 120 mL, Rfl: 0 .  magic mouthwash w/lidocaine SOLN, Take 5 mLs by mouth 4 (four) times daily -  before meals and at bedtime., Disp: 120 mL, Rfl: 0 .  temazepam (RESTORIL) 15 MG capsule, Take 1 capsule (15 mg total) by mouth at bedtime as needed for sleep., Disp: 30 capsule, Rfl: 0  Allergies  Allergen Reactions  . Penicillins Rash    I personally reviewed active problem list, medication list, allergies, family history with the patient/caregiver today.   ROS  Ten systems reviewed and is negative except as mentioned in HPI   Objective  Virtual encounter, vitals not obtained.  There is no height or weight on file to calculate BMI.  Physical Exam  Awake, alert and oriented, coughing during the visit in no acute distress. '  PHQ2/9: Depression screen [facility] 2/9 05/08/2020 04/29/2020 03/23/2020 01/17/2020 12/13/2019  Decreased Interest 0 0 1 1 2   Down, Depressed, Hopeless 0 0 0 1 2  PHQ - 2 Score 0 0 1 2 4   Altered sleeping 2 - 1 2 2   Tired, decreased energy 2 - 1 1 3   Change in appetite 0 - 0 0 0  Feeling bad or failure about yourself  0 - 0 1 2  Trouble concentrating 0 - 1 1 1   Moving slowly or fidgety/restless 0 - 0 0 2  Suicidal thoughts 0 - 0 0 0  PHQ-9 Score 4 - 4 7 14   Difficult doing work/chores Not difficult at all - Somewhat difficult Somewhat difficult -  Some recent data might be hidden   PHQ-2/9 Result is positive.    Fall Risk: Fall Risk  05/08/2020 04/29/2020 03/26/2020 03/23/2020 01/17/2020  Falls in the past year? 0 0 0 0 0  Number falls in past yr: 0 0 - 0 0  Injury with Fall? 0 0 - 0 0  Risk for fall due to : - - - - -  Follow up - Falls evaluation completed - - -    Assessment & Plan  1. Cough  - DG Chest 2 View; Future  2. COVID-19  - DG Chest 2 View; Future  3. Pleuritic chest pain  - predniSONE (DELTASONE) 10 MG tablet; Take 1 tablet (10 mg total) by mouth 2 (two) times daily  with a meal.  Dispense: 10 tablet; Refill: 0  Explained that if sudden onset of sob or worsening of symptoms he should go to [facility] to rule PE since covid can cause hypercoagulable state.   I discussed the assessment and treatment plan with the patient. The patient was provided an opportunity to ask questions and all were answered. The patient agreed  with the plan and demonstrated an understanding of the instructions.  The patient was advised to call back or seek an in-person evaluation if the symptoms worsen or if the condition fails to improve as anticipated.  I provided 15  minutes of non-face-to-face time during this encounter.

## 2020-06-22 NOTE — Progress Notes (Deleted)
Name: Gregory Matthews   MRN: [id number]    DOB: 1981/01/06   Date:06/22/2020       Progress Note  Subjective  Chief Complaint  No chief complaint on file.   HPI  Patient presents for annual CPE ***.  IPSS Questionnaire (AUA-7): Over the past month.   1)  How often have you had a sensation of not emptying your bladder completely after you finish urinating?  {Rating:19227}  2)  How often have you had to urinate again less than two hours after you finished urinating? {Rating:19227}  3)  How often have you found you stopped and started again several times when you urinated?  {Rating:19227}  4) How difficult have you found it to postpone urination?  {Rating:19227}  5) How often have you had a weak urinary stream?  {Rating:19227}  6) How often have you had to push or strain to begin urination?  {Rating:19227}  7) How many times did you most typically get up to urinate from the time you went to bed until the time you got up in the morning?  {Rating:19228}  Total score:  0-7 mildly symptomatic   8-19 moderately symptomatic   20-35 severely symptomatic     Diet: *** Exercise: ***  Depression: phq 9 is {gen pos QMV:[id number]} Depression screen [facility] 2/9 05/08/2020 04/29/2020 03/23/2020 01/17/2020 12/13/2019  Decreased Interest 0 0 1 1 2   Down, Depressed, Hopeless 0 0 0 1 2  PHQ - 2 Score 0 0 1 2 4   Altered sleeping 2 - 1 2 2   Tired, decreased energy 2 - 1 1 3   Change in appetite 0 - 0 0 0  Feeling bad or failure about yourself  0 - 0 1 2  Trouble concentrating 0 - 1 1 1   Moving slowly or fidgety/restless 0 - 0 0 2  Suicidal thoughts 0 - 0 0 0  PHQ-9 Score 4 - 4 7 14   Difficult doing work/chores Not difficult at all - Somewhat difficult Somewhat difficult -  Some recent data might be hidden    Hypertension:  BP Readings from Last 3 Encounters:  03/26/20 (!) 146/84  03/23/20 136/82  01/17/20 128/82    Obesity: Wt Readings from Last 3 Encounters:  03/26/20 247 lb (112 kg)  03/23/20  248 lb 1.6 oz (112.5 kg)  01/17/20 250 lb (113.4 kg)   BMI Readings from Last 3 Encounters:  03/26/20 35.44 kg/m  03/23/20 35.60 kg/m  01/17/20 35.87 kg/m     Lipids:  Lab Results  Component Value Date   CHOL 222 (H) 07/17/2018   CHOL 197 12/08/2015   CHOL 204 (A) 05/12/2014   Lab Results  Component Value Date   HDL 45 07/17/2018   HDL 49 12/08/2015   HDL 44 05/12/2014   Lab Results  Component Value Date   LDLCALC 151 (H) 07/17/2018   LDLCALC 127 12/08/2015   LDLCALC 134 05/12/2014   Lab Results  Component Value Date   TRIG 131 07/17/2018   TRIG 106 12/08/2015   TRIG 129 05/12/2014   Lab Results  Component Value Date   CHOLHDL 4.9 07/17/2018   CHOLHDL 4.0 12/08/2015   No results found for: LDLDIRECT Glucose:  Glucose  Date Value Ref Range Status  06/25/2012 138 (H) 65 - 99 mg/dL Final   Glucose, Bld  Date Value Ref Range Status  07/17/2018 94 65 - 99 mg/dL Final    Comment:    .  Fasting reference interval .   04/27/2018 94 70 - 99 mg/dL Final  74/03/8785 767 65 - 139 mg/dL Final    Comment:    .        Non-fasting reference interval .     Flowsheet Row Video Visit from 04/29/2020 in [facility]  AUDIT-C Score 0     ***  Married STD testing and prevention (HIV/chl/gon/syphilis):  Hep C: 04/27/18  Skin cancer: Discussed monitoring for atypical lesions Colorectal cancer: N/A Prostate cancer: N/A No results found for: PSA   Lung cancer: Low Dose CT Chest recommended if Age 32-80 years, 30 pack-year currently smoking OR have quit w/in 15years. Patient does not qualify.   AAA: The USPSTF recommends one-time screening with ultrasonography in men ages 14 to 46 years who have ever smoked ECG:  11/06/18  Vaccines:  HPV: up to at age 8 , ask insurance if age between 49-45  Shingrix: 11-64 yo and ask insurance if covered when patient above 54 yo Pneumonia: educated and discussed with patient. Flu: educated and  discussed with patient.  Advanced Care Planning: A voluntary discussion about advance care planning including the explanation and discussion of advance directives.  Discussed health care proxy and Living will, and the patient was able to identify a health care proxy as ***.  Patient does not have a living will at present time. If patient does have living will, I have requested they bring this to the clinic to be scanned in to their chart.  Patient Active Problem List   Diagnosis Date Noted  . Severe episode of recurrent major depressive disorder, without psychotic features (HCC) 07/17/2018  . Headache, migraine 06/08/2015  . Obesity (BMI 30.0-34.9) 06/08/2015  . Allergic rhinitis, seasonal 07/26/2013    Past Surgical History:  Procedure Laterality Date  . HAIR TRANSPLANT    . RHINOPLASTY    . TONSILLECTOMY      Family History  Problem Relation Age of Onset  . Cancer Mother        Breast  . Hypertension Father   . Heart disease Father   . Heart attack Father   . Cancer Maternal Aunt        Breast  . Hyperlipidemia Maternal Grandmother   . Parkinson's disease Paternal Grandfather     Social History   Socioeconomic History  . Marital status: Married    Spouse name: Morrie Sheldon   . Number of children: 0  . Years of education: Not on file  . Highest education level: Bachelor's degree (e.g., BA, AB, BS)  Occupational History  . Occupation: Runner, broadcasting/film/video   Tobacco Use  . Smoking status: Never Smoker  . Smokeless tobacco: Never Used  Vaping Use  . Vaping Use: Never used  Substance and Sexual Activity  . Alcohol use: No    Alcohol/week: 0.0 standard drinks  . Drug use: No  . Sexual activity: Yes    Partners: Female  Other Topics Concern  . Not on file  Social History Narrative   Married, he is a Manufacturing engineer Strain: Not on file  Food Insecurity: No Food Insecurity  . Worried About Programme researcher, broadcasting/film/video in the Last Year: Never  true  . Ran Out of Food in the Last Year: Never true  Transportation Needs: Not on file  Physical Activity: Insufficiently Active  . Days of Exercise per Week: 2 days  . Minutes of Exercise per Session: 60 min  Stress: No Stress Concern Present  . Feeling of Stress : Only a little  Social Connections: Not on file  Intimate Partner Violence: Not on file     Current Outpatient Medications:  .  amitriptyline (ELAVIL) 10 MG tablet, Take 1 tablet (10 mg total) by mouth at bedtime., Disp: 90 tablet, Rfl: 1 .  amphetamine-dextroamphetamine (ADDERALL) 10 MG tablet, Take 1 tablet (10 mg total) by mouth 2 (two) times daily., Disp: 180 tablet, Rfl: 0 .  divalproex (DEPAKOTE) 250 MG DR tablet, Take 1 tablet (250 mg total) by mouth 2 (two) times daily., Disp: 180 tablet, Rfl: 0 .  escitalopram (LEXAPRO) 10 MG tablet, Take 1 tablet (10 mg total) by mouth daily., Disp: 90 tablet, Rfl: 1 .  guaiFENesin-codeine 100-10 MG/5ML syrup, Take 10 mLs by mouth 3 (three) times daily as needed for cough., Disp: 120 mL, Rfl: 0 .  magic mouthwash w/lidocaine SOLN, Take 5 mLs by mouth 4 (four) times daily -  before meals and at bedtime., Disp: 120 mL, Rfl: 0 .  predniSONE (DELTASONE) 10 MG tablet, Take 1 tablet (10 mg total) by mouth 2 (two) times daily with a meal., Disp: 10 tablet, Rfl: 0 .  temazepam (RESTORIL) 15 MG capsule, Take 1 capsule (15 mg total) by mouth at bedtime as needed for sleep., Disp: 30 capsule, Rfl: 0  Allergies  Allergen Reactions  . Penicillins Rash     ROS  ***   Objective  There were no vitals filed for this visit.  There is no height or weight on file to calculate BMI.  Physical Exam ***  No results found for this or any previous visit (from the past 2160 hour(s)).   Fall Risk: Fall Risk  05/08/2020 04/29/2020 03/26/2020 03/23/2020 01/17/2020  Falls in the past year? 0 0 0 0 0  Number falls in past yr: 0 0 - 0 0  Injury with Fall? 0 0 - 0 0  Risk for fall due to : - - - - -   Follow up - Falls evaluation completed - - -   ***   Functional Status Survey:   ***   Assessment & Plan  There are no diagnoses linked to this encounter.   -Prostate cancer screening and PSA options (with potential risks and benefits of testing vs not testing) were discussed along with recent recs/guidelines. -USPSTF grade A and B recommendations reviewed with patient; age-appropriate recommendations, preventive care, screening tests, etc discussed and encouraged; healthy living encouraged; see AVS for patient education given to patient -Discussed importance of 150 minutes of physical activity weekly, eat two servings of fish weekly, eat one serving of tree nuts ( cashews, pistachios, pecans, almonds.Marland Kitchen) every other day, eat 6 servings of fruit/vegetables daily and drink plenty of water and avoid sweet beverages.  -Reviewed Health Maintenance: ***

## 2020-06-24 ENCOUNTER — Encounter: Payer: BC Managed Care – PPO | Admitting: Family Medicine

## 2020-07-01 ENCOUNTER — Other Ambulatory Visit: Payer: Self-pay

## 2020-07-01 ENCOUNTER — Telehealth: Payer: Self-pay

## 2020-07-01 DIAGNOSIS — F331 Major depressive disorder, recurrent, moderate: Secondary | ICD-10-CM

## 2020-07-01 MED ORDER — ESCITALOPRAM OXALATE 10 MG PO TABS: 10.0000 mg | ORAL_TABLET | Freq: Every day | ORAL | 1 refills | Status: DC

## 2020-07-01 NOTE — Telephone Encounter (Signed)
Pt needs refill on escitalopram to be sent to [facility]

## 2020-07-01 NOTE — Telephone Encounter (Signed)
Melissa left pt a message

## 2020-08-11 ENCOUNTER — Encounter: Payer: Self-pay | Admitting: Family Medicine

## 2020-08-11 ENCOUNTER — Ambulatory Visit (INDEPENDENT_AMBULATORY_CARE_PROVIDER_SITE_OTHER): Payer: BC Managed Care – PPO | Admitting: Family Medicine

## 2020-08-11 ENCOUNTER — Other Ambulatory Visit: Payer: Self-pay

## 2020-08-11 VITALS — BP 118/72 | HR 74 | Temp 98.0°F | Resp 16 | Ht 70.0 in | Wt 244.0 lb

## 2020-08-11 DIAGNOSIS — Z131 Encounter for screening for diabetes mellitus: Secondary | ICD-10-CM

## 2020-08-11 DIAGNOSIS — Z114 Encounter for screening for human immunodeficiency virus [HIV]: Secondary | ICD-10-CM

## 2020-08-11 DIAGNOSIS — G43009 Migraine without aura, not intractable, without status migrainosus: Secondary | ICD-10-CM | POA: Diagnosis not present

## 2020-08-11 DIAGNOSIS — Z23 Encounter for immunization: Secondary | ICD-10-CM | POA: Diagnosis not present

## 2020-08-11 DIAGNOSIS — E785 Hyperlipidemia, unspecified: Secondary | ICD-10-CM

## 2020-08-11 DIAGNOSIS — F330 Major depressive disorder, recurrent, mild: Secondary | ICD-10-CM | POA: Diagnosis not present

## 2020-08-11 DIAGNOSIS — Z0000 Encounter for general adult medical examination without abnormal findings: Secondary | ICD-10-CM

## 2020-08-11 DIAGNOSIS — F5104 Psychophysiologic insomnia: Secondary | ICD-10-CM

## 2020-08-11 DIAGNOSIS — R5383 Other fatigue: Secondary | ICD-10-CM

## 2020-08-11 DIAGNOSIS — F411 Generalized anxiety disorder: Secondary | ICD-10-CM

## 2020-08-11 MED ORDER — AMITRIPTYLINE HCL 10 MG PO TABS: 10.0000 mg | ORAL_TABLET | Freq: Every day | ORAL | 1 refills | Status: DC

## 2020-08-11 MED ORDER — DIVALPROEX SODIUM 250 MG PO DR TAB: 250.0000 mg | DELAYED_RELEASE_TABLET | Freq: Two times a day (BID) | ORAL | 0 refills | Status: DC

## 2020-08-11 NOTE — Progress Notes (Signed)
Name: Gregory Matthews   MRN: [id number]    DOB: 23-Dec-1980   Date:08/11/2020       Progress Note  Subjective  Chief Complaint  Annual Exam  HPI  Patient presents for annual CPE and follow up.  MDD/GAD:diagnosed 05/2019  he is getting marital counseling and also solo sessions with the same therapist, therapy is still helping.   His Phq 9has been stable. .Heis still taking Depakote usually once a day but when he feels down he takes extra pill for a period of time.he has been taking lexapro for over one year now. He states Spring has helped his mood, also being active, doing a 8 week fitness program and is feeling well.   Insomnia:he has tried Hydroxizine and Trazodone but did not work well for him, he took Temazepam but stopped, not sure the reason, but doing well on Melatonin nightly   Migraine:We have tried Topamax and Imitrex. He has stopped both medication secondary to side effects. Topamax he stopped because of change in taste and Imitrex, because flushing sensation and chest tightness. Episodes of migraine are described as a dull headache, followed by nausea andthe pain intensifies and becomes throbbingorpounding sensation in different parts of his head. It is associated with photophobia, phonophobia and movement - such as walking.He has been taking Elavil 10 mg at night and episodes of migraine are about once a week. He states sometimes triggered by his head being down while sleeping. Sometimes triggered by lack of caffeine, he will try to cut down on that - he was drinking 3-4 sodas per day, he is currently down to two cans daily    IPSS Questionnaire (AUA-7): Over the past month.   1)  How often have you had a sensation of not emptying your bladder completely after you finish urinating?  0 - Not at all  2)  How often have you had to urinate again less than two hours after you finished urinating? 0 - Not at all  3)  How often have you found you stopped and started again  several times when you urinated?  0 - Not at all  4) How difficult have you found it to postpone urination?  0 - Not at all  5) How often have you had a weak urinary stream?  0 - Not at all  6) How often have you had to push or strain to begin urination?  0 - Not at all  7) How many times did you most typically get up to urinate from the time you went to bed until the time you got up in the morning?  2 - 2 times  Total score:  0-7 mildly symptomatic   8-19 moderately symptomatic   20-35 severely symptomatic     Diet: he is following a low carb diet Exercise: doing a 8 weeks fitness challenge with his wife   Depression: phq 9 is negative Depression screen [facility] 2/9 08/11/2020 05/08/2020 04/29/2020 03/23/2020 01/17/2020  Decreased Interest 0 0 0 1 1  Down, Depressed, Hopeless 0 0 0 0 1  PHQ - 2 Score 0 0 0 1 2  Altered sleeping 2 2 - 1 2  Tired, decreased energy - 2 - 1 1  Change in appetite 0 0 - 0 0  Feeling bad or failure about yourself  1 0 - 0 1  Trouble concentrating 1 0 - 1 1  Moving slowly or fidgety/restless 0 0 - 0 0  Suicidal thoughts 0 0 - 0  0  PHQ-9 Score 4 4 - 4 7  Difficult doing work/chores - Not difficult at all - Somewhat difficult Somewhat difficult  Some recent data might be hidden    Hypertension:  BP Readings from Last 3 Encounters:  08/11/20 118/72  03/26/20 (!) 146/84  03/23/20 136/82    Obesity: Wt Readings from Last 3 Encounters:  08/11/20 244 lb (110.7 kg)  03/26/20 247 lb (112 kg)  03/23/20 248 lb 1.6 oz (112.5 kg)   BMI Readings from Last 3 Encounters:  08/11/20 35.01 kg/m  03/26/20 35.44 kg/m  03/23/20 35.60 kg/m     Lipids:  Lab Results  Component Value Date   CHOL 222 (H) 07/17/2018   CHOL 197 12/08/2015   CHOL 204 (A) 05/12/2014   Lab Results  Component Value Date   HDL 45 07/17/2018   HDL 49 12/08/2015   HDL 44 05/12/2014   Lab Results  Component Value Date   LDLCALC 151 (H) 07/17/2018   LDLCALC 127 12/08/2015   LDLCALC 134  05/12/2014   Lab Results  Component Value Date   TRIG 131 07/17/2018   TRIG 106 12/08/2015   TRIG 129 05/12/2014   Lab Results  Component Value Date   CHOLHDL 4.9 07/17/2018   CHOLHDL 4.0 12/08/2015   No results found for: LDLDIRECT Glucose:  Glucose  Date Value Ref Range Status  06/25/2012 138 (H) 65 - 99 mg/dL Final   Glucose, Bld  Date Value Ref Range Status  07/17/2018 94 65 - 99 mg/dL Final    Comment:    .            Fasting reference interval .   04/27/2018 94 70 - 99 mg/dL Final  16/10/960411/18/2019 540108 65 - 139 mg/dL Final    Comment:    .        Non-fasting reference interval .     Flowsheet Row [facility]  AUDIT-C Score 0      Married STD testing and prevention (HIV/chl/gon/syphilis): not interested , except for HIV since never had it before Hep C: 04/27/18 Skin cancer: Discussed monitoring for atypical lesions Colorectal cancer: N/A Prostate cancer: N/A  ECG:  11/06/18  Vaccines:   Pneumonia: educated and discussed with patient. Flu: ducated and discussed with patient.  Advanced Care Planning: A voluntary discussion about advance care planning including the explanation and discussion of advance directives.  Discussed health care proxy and Living will, and the patient was able to identify a health care proxy as wife.  Patient does not have a living will at present time. If patient does have living will, I have requested they bring this to the clinic to be scanned in to their chart.  Patient Active Problem List   Diagnosis Date Noted  . Severe episode of recurrent major depressive disorder, without psychotic features (HCC) 07/17/2018  . Headache, migraine 06/08/2015  . Obesity (BMI 30.0-34.9) 06/08/2015  . Allergic rhinitis, seasonal 07/26/2013    Past Surgical History:  Procedure Laterality Date  . HAIR TRANSPLANT    . RHINOPLASTY    . TONSILLECTOMY      Family History  Problem Relation Age of  Onset  . Cancer Mother        Breast  . Hypertension Father   . Heart disease Father   . Heart attack Father   . Cancer Maternal Aunt        Breast  . Hyperlipidemia Maternal Grandmother   .  Parkinson's disease Paternal Grandfather     Social History   Socioeconomic History  . Marital status: Married    Spouse name: Morrie Sheldon   . Number of children: 0  . Years of education: Not on file  . Highest education level: Bachelor's degree (e.g., BA, AB, BS)  Occupational History  . Occupation: Runner, broadcasting/film/video   Tobacco Use  . Smoking status: Never Smoker  . Smokeless tobacco: Never Used  Vaping Use  . Vaping Use: Never used  Substance and Sexual Activity  . Alcohol use: No    Alcohol/week: 0.0 standard drinks  . Drug use: No  . Sexual activity: Yes    Partners: Female  Other Topics Concern  . Not on file  Social History Narrative   Married, he is a Manufacturing engineer Strain: Low Risk   . Difficulty of Paying Living Expenses: Not hard at all  Food Insecurity: No Food Insecurity  . Worried About Programme researcher, broadcasting/film/video in the Last Year: Never true  . Ran Out of Food in the Last Year: Never true  Transportation Needs: No Transportation Needs  . Lack of Transportation (Medical): No  . Lack of Transportation (Non-Medical): No  Physical Activity: Insufficiently Active  . Days of Exercise per Week: 2 days  . Minutes of Exercise per Session: 30 min  Stress: Stress Concern Present  . Feeling of Stress : To some extent  Social Connections: Socially Integrated  . Frequency of Communication with Friends and Family: More than three times a week  . Frequency of Social Gatherings with Friends and Family: More than three times a week  . Attends Religious Services: More than 4 times per year  . Active Member of Clubs or Organizations: Yes  . Attends Banker Meetings: More than 4 times per year  . Marital Status: Married  Catering manager  Violence: Not At Risk  . Fear of Current or Ex-Partner: No  . Emotionally Abused: No  . Physically Abused: No  . Sexually Abused: No     Current Outpatient Medications:  .  amphetamine-dextroamphetamine (ADDERALL) 10 MG tablet, Take 1 tablet (10 mg total) by mouth 2 (two) times daily., Disp: 180 tablet, Rfl: 0 .  escitalopram (LEXAPRO) 10 MG tablet, Take 1 tablet (10 mg total) by mouth daily., Disp: 90 tablet, Rfl: 1 .  Melatonin 5 MG CHEW, Chew 1 tablet by mouth daily., Disp: , Rfl:  .  amitriptyline (ELAVIL) 10 MG tablet, Take 1 tablet (10 mg total) by mouth at bedtime., Disp: 90 tablet, Rfl: 1 .  divalproex (DEPAKOTE) 250 MG DR tablet, Take 1 tablet (250 mg total) by mouth 2 (two) times daily., Disp: 180 tablet, Rfl: 0  Allergies  Allergen Reactions  . Penicillins Rash     ROS  Constitutional: Negative for fever or weight change.  Respiratory: Negative for cough and shortness of breath.   Cardiovascular: Negative for chest pain or palpitations.  Gastrointestinal: Negative for abdominal pain, no bowel changes.  Musculoskeletal: Negative for gait problem or joint swelling.  Skin: Negative for rash.  Neurological: Negative for dizziness or headache.  No other specific complaints in a complete review of systems (except as listed in HPI above).  Objective  Vitals:   08/11/20 1254  BP: 118/72  Pulse: 74  Resp: 16  Temp: 98 F (36.7 C)  TempSrc: Oral  SpO2: 99%  Weight: 244 lb (110.7 kg)  Height: 5\' 10"  (1.778 m)  Body mass index is 35.01 kg/m.  Physical Exam  Constitutional: Patient appears well-developed and well-nourished. No distress.  HENT: Head: Normocephalic and atraumatic. Ears: B TMs ok, no erythema or effusion; Nose: Not done Mouth/Throat: not done Eyes: Conjunctivae and EOM are normal. Pupils are equal, round, and reactive to light. No scleral icterus.  Neck: Normal range of motion. Neck supple. No JVD present. No thyromegaly present.  Cardiovascular:  Normal rate, regular rhythm and normal heart sounds.  No murmur heard. No BLE edema. Pulmonary/Chest: Effort normal and breath sounds normal. No respiratory distress. Abdominal: Soft. Bowel sounds are normal, no distension. There is no tenderness. no masses MALE GENITALIA: Normal descended testes bilaterally, no masses palpated, no hernias, no lesions, no discharge  RECTAL: not done  Musculoskeletal: Normal range of motion, no joint effusions. No gross deformities Neurological: he is alert and oriented to person, place, and time. No cranial nerve deficit. Coordination, balance, strength, speech and gait are normal.  Skin: Skin is warm and dry. No rash noted. No erythema.  Psychiatric: Patient has a normal mood and affect. behavior is normal. Judgment and thought content normal.   Fall Risk: Fall Risk  08/11/2020 05/08/2020 04/29/2020 03/26/2020 03/23/2020  Falls in the past year? 0 0 0 0 0  Number falls in past yr: 0 0 0 - 0  Injury with Fall? 0 0 0 - 0  Risk for fall due to : - - - - -  Follow up - - Falls evaluation completed - -     Assessment & Plan  1. GAD (generalized anxiety disorder)   2. Migraine without aura and without status migrainosus, not intractable  - amitriptyline (ELAVIL) 10 MG tablet; Take 1 tablet (10 mg total) by mouth at bedtime.  Dispense: 90 tablet; Refill: 1 - divalproex (DEPAKOTE) 250 MG DR tablet; Take 1 tablet (250 mg total) by mouth 2 (two) times daily.  Dispense: 180 tablet; Refill: 0  3. Psychophysiological insomnia   4. Mild episode of recurrent major depressive disorder (HCC)  - TSH  5. Well adult exam  - COMPLETE METABOLIC PANEL WITH GFR - CBC with Differential/Platelet - Vitamin B12  6. Other fatigue  - COMPLETE METABOLIC PANEL WITH GFR - CBC with Differential/Platelet - Vitamin B12 - VITAMIN D 25 Hydroxy (Vit-D Deficiency, Fractures)  7. Need for Tdap vaccination  - Tdap vaccine greater than or equal to 7yo IM  8. Encounter for  screening for HIV  - HIV Antibody (routine testing w rflx)  9. Diabetes mellitus screening  - Hemoglobin A1c  10. Dyslipidemia  - Lipid panel  -Prostate cancer screening and PSA options (with potential risks and benefits of testing vs not testing) were discussed along with recent recs/guidelines. -USPSTF grade A and B recommendations reviewed with patient; age-appropriate recommendations, preventive care, screening tests, etc discussed and encouraged; healthy living encouraged; see AVS for patient education given to patient -Discussed importance of 150 minutes of physical activity weekly, eat two servings of fish weekly, eat one serving of tree nuts ( cashews, pistachios, pecans, almonds.Marland Kitchen) every other day, eat 6 servings of fruit/vegetables daily and drink plenty of water and avoid sweet beverages.

## 2020-08-12 LAB — COMPLETE METABOLIC PANEL WITH GFR
AG Ratio: 1.6 (calc) (ref 1.0–2.5)
ALT: 77 U/L — ABNORMAL HIGH (ref 9–46)
AST: 32 U/L (ref 10–40)
Albumin: 4.9 g/dL (ref 3.6–5.1)
Alkaline phosphatase (APISO): 50 U/L (ref 36–130)
BUN: 20 mg/dL (ref 7–25)
CO2: 26 mmol/L (ref 20–32)
Calcium: 9.8 mg/dL (ref 8.6–10.3)
Chloride: 101 mmol/L (ref 98–110)
Creat: 0.92 mg/dL (ref 0.60–1.35)
GFR, Est African American: 119 mL/min/{1.73_m2} (ref >=60)
GFR, Est Non African American: 103 mL/min/{1.73_m2} (ref >=60)
Globulin: 3.1 g/dL (calc) (ref 1.9–3.7)
Glucose, Bld: 87 mg/dL (ref 65–99)
Potassium: 4.5 mmol/L (ref 3.5–5.3)
Sodium: 136 mmol/L (ref 135–146)
Total Bilirubin: 0.4 mg/dL (ref 0.2–1.2)
Total Protein: 8 g/dL (ref 6.1–8.1)

## 2020-08-12 LAB — CBC WITH DIFFERENTIAL/PLATELET
Absolute Monocytes: 638 cells/uL (ref 200–950)
Basophils Absolute: 34 cells/uL (ref 0–200)
Basophils Relative: 0.4 %
Eosinophils Absolute: 109 cells/uL (ref 15–500)
Eosinophils Relative: 1.3 %
HCT: 48.1 % (ref 38.5–50.0)
Hemoglobin: 16.1 g/dL (ref 13.2–17.1)
Lymphs Abs: 2192 cells/uL (ref 850–3900)
MCH: 29.4 pg (ref 27.0–33.0)
MCHC: 33.5 g/dL (ref 32.0–36.0)
MCV: 87.9 fL (ref 80.0–100.0)
MPV: 8.6 fL (ref 7.5–12.5)
Monocytes Relative: 7.6 %
Neutro Abs: 5426 cells/uL (ref 1500–7800)
Neutrophils Relative %: 64.6 %
Platelets: 387 10*3/uL (ref 140–400)
RBC: 5.47 10*6/uL (ref 4.20–5.80)
RDW: 11.9 % (ref 11.0–15.0)
Total Lymphocyte: 26.1 %
WBC: 8.4 10*3/uL (ref 3.8–10.8)

## 2020-08-12 LAB — VITAMIN B12: Vitamin B-12: 326 pg/mL (ref 200–1100)

## 2020-08-12 LAB — LIPID PANEL
Cholesterol: 202 mg/dL — ABNORMAL HIGH (ref <200)
HDL: 40 mg/dL (ref >=40)
LDL Cholesterol (Calc): 127 mg/dL (calc) — ABNORMAL HIGH
Non-HDL Cholesterol (Calc): 162 mg/dL (calc) — ABNORMAL HIGH (ref <130)
Total CHOL/HDL Ratio: 5.1 (calc) — ABNORMAL HIGH (ref <5.0)
Triglycerides: 208 mg/dL — ABNORMAL HIGH (ref <150)

## 2020-08-12 LAB — HEMOGLOBIN A1C
Hgb A1c MFr Bld: 5.2 % of total Hgb (ref <5.7)
Mean Plasma Glucose: 103 mg/dL
eAG (mmol/L): 5.7 mmol/L

## 2020-08-12 LAB — TSH: TSH: 1.13 mIU/L (ref 0.40–4.50)

## 2020-08-12 LAB — HIV ANTIBODY (ROUTINE TESTING W REFLEX): HIV 1&2 Ab, 4th Generation: NONREACTIVE

## 2020-08-12 LAB — VITAMIN D 25 HYDROXY (VIT D DEFICIENCY, FRACTURES): Vit D, 25-Hydroxy: 20 ng/mL — ABNORMAL LOW (ref 30–100)

## 2020-08-14 ENCOUNTER — Encounter: Payer: BC Managed Care – PPO | Admitting: Family Medicine

## 2020-10-07 NOTE — Progress Notes (Signed)
Name: Gregory Matthews   MRN: [id number]    DOB: 09-27-1980   Date:10/09/2020       Progress Note  Subjective  Chief Complaint  Follow up/Medication Refill  HPI  MDD/GAD:diagnosed 05/2019  he is getting marital counseling and also solo sessions with the same therapist, therapy is still helping.    His Phq 9 has been stable. Marland Kitchen.He is still taking Depakote usually once a day but when he feels down he takes extra pill for a period of time.he has been taking lexapro for over one year now., he is stable, happy with medications    Insomnia: he has tried Hydroxizine and Trazodone but did not work well for him, he took Temazepam but stopped, not sure the reason, he was doing well on Melatonin but lately taking too long to fall asleep again. We will try a new medication today    Migraine:We have tried Topamax and Imitrex. He has stopped both medication secondary to side effects. Topamax he stopped because of change in taste and Imitrex, because flushing sensation and chest tightness. Episodes of migraine are described as a dull  headache, followed by nausea and the pain intensifies and becomes throbbing or  pounding sensation in different parts of his head.  It is associated with photophobia, phonophobia and movement - such as walking. He has been taking Elavil 10 mg at night but he has noticed morning headaches most days of the week, having migraine episodes now at least twice a week, taking Excedrin more often .   Dyslipidemia:   The 10-year ASCVD risk score [name](Goff DC Jr., et al., 2013) is: 1.5%   Values used to calculate the score:     Age: [age] years     Sex: Male     Is Non-Hispanic African American: No     Diabetic: No     Tobacco smoker: No     Systolic Blood Pressure: 118 mmHg     Is BP treated: No     HDL Cholesterol: 40 mg/dL     Total Cholesterol: 202 mg/dL   [id number] deficiency: reviewed last labs, we will start him on otc supplementation a few times a week  Vitamin D deficiency: he will  start otc supplementation  Obesity: he is doing a weight loss challenge with his wife with the assistance of a personal trainer also following a meal plan and is doing well, losing weight, and also measurements Feeling better. His initial weight was 245 lbs    Patient Active Problem List   Diagnosis Date Noted   Severe episode of recurrent major depressive disorder, without psychotic features (HCC) 07/17/2018   Headache, migraine 06/08/2015   Obesity (BMI 30.0-34.9) 06/08/2015   Allergic rhinitis, seasonal 07/26/2013    Past Surgical History:  Procedure Laterality Date   HAIR TRANSPLANT     RHINOPLASTY     TONSILLECTOMY      Family History  Problem Relation Age of Onset   Cancer Mother        Breast   Hypertension Father    Heart disease Father    Heart attack Father    Cancer Maternal Aunt        Breast   Hyperlipidemia Maternal Grandmother    Parkinson's disease Paternal Grandfather     Social History   Tobacco Use   Smoking status: Never   Smokeless tobacco: Never  Substance Use Topics   Alcohol use: No    Alcohol/week: 0.0 standard drinks  Current Outpatient Medications:    amitriptyline (ELAVIL) 10 MG tablet, Take 1 tablet (10 mg total) by mouth at bedtime., Disp: 90 tablet, Rfl: 1   amphetamine-dextroamphetamine (ADDERALL) 10 MG tablet, Take 1 tablet (10 mg total) by mouth 2 (two) times daily., Disp: 180 tablet, Rfl: 0   divalproex (DEPAKOTE) 250 MG DR tablet, Take 1 tablet (250 mg total) by mouth 2 (two) times daily., Disp: 180 tablet, Rfl: 0   escitalopram (LEXAPRO) 10 MG tablet, Take 1 tablet (10 mg total) by mouth daily., Disp: 90 tablet, Rfl: 1   Melatonin 5 MG CHEW, Chew 1 tablet by mouth daily., Disp: , Rfl:   Allergies  Allergen Reactions   Penicillins Rash    I personally reviewed active problem list, medication list, allergies, family history, social history, health maintenance with the patient/caregiver today.   ROS  Constitutional:  Negative for fever or weight change.  Respiratory: Negative for cough and shortness of breath.   Cardiovascular: Negative for chest pain or palpitations.  Gastrointestinal: Negative for abdominal pain, no bowel changes.  Musculoskeletal: Negative for gait problem or joint swelling.  Skin: Negative for rash.  Neurological: Negative for dizziness , positive for headache.  No other specific complaints in a complete review of systems (except as listed in HPI above).   Objective  Vitals:   10/09/20 0931  BP: 118/68  Pulse: 83  Resp: 16  Temp: 97.6 F (36.4 C)  TempSrc: Oral  SpO2: 98%  Weight: 237 lb (107.5 kg)  Height: 5\' 10"  (1.778 m)    Body mass index is 34.01 kg/m.  Physical Exam  Constitutional: Patient appears well-developed and well-nourished. Obese  No distress.  HEENT: head atraumatic, normocephalic, pupils equal and reactive to light, neck supple Cardiovascular: Normal rate, regular rhythm and normal heart sounds.  No murmur heard. No BLE edema. Pulmonary/Chest: Effort normal and breath sounds normal. No respiratory distress. Abdominal: Soft.  There is no tenderness. Psychiatric: Patient has a normal mood and affect. behavior is normal. Judgment and thought content normal.   Recent Results (from the past 2160 hour(s))  Lipid panel     Status: Abnormal   Collection Time: 08/11/20 12:00 AM  Result Value Ref Range   Cholesterol 202 (H) <200 mg/dL   HDL 40 > OR = 40 mg/dL   Triglycerides 08/13/20 (H) <150 mg/dL    Comment: . If a non-fasting specimen was collected, consider repeat triglyceride testing on a fasting specimen if clinically indicated.  431 et al. J. of Clin. Lipidol. 2015;9:129-169. [place]    LDL Cholesterol (Calc) 127 (H) mg/dL (calc)    Comment: Reference range: <100 . Desirable range <100 mg/dL for primary prevention;   <70 mg/dL for patients with CHD or diabetic patients  with > or = 2 CHD risk factors. Marland Kitchen LDL-C is now calculated using the  Martin-Hopkins  calculation, which is a validated novel method providing  better accuracy than the Friedewald equation in the  estimation of LDL-C.  Marland Kitchen et al. Horald Pollen. Lenox Ahr): 2061-2068  (http://education.[facility].com/faq/FAQ164)    Total CHOL/HDL Ratio 5.1 (H) <5.0 (calc)   Non-HDL Cholesterol (Calc) 162 (H) <130 mg/dL (calc)    Comment: For patients with diabetes plus 1 major ASCVD risk  factor, treating to a non-HDL-C goal of <100 mg/dL  (LDL-C of 01-02-1972 mg/dL) is considered a therapeutic  option.   COMPLETE METABOLIC PANEL WITH GFR     Status: Abnormal   Collection Time: 08/11/20 12:00 AM  Result Value Ref Range  Glucose, Bld 87 65 - 99 mg/dL    Comment: .            Fasting reference interval .    BUN 20 7 - 25 mg/dL   Creat 1.61 0.96 - 0.45 mg/dL   GFR, Est Non African American 103 > OR = 60 mL/min/1.76m2   GFR, Est African American 119 > OR = 60 mL/min/1.67m2   BUN/Creatinine Ratio NOT APPLICABLE 6 - 22 (calc)   Sodium 136 135 - 146 mmol/L   Potassium 4.5 3.5 - 5.3 mmol/L   Chloride 101 98 - 110 mmol/L   CO2 26 20 - 32 mmol/L   Calcium 9.8 8.6 - 10.3 mg/dL   Total Protein 8.0 6.1 - 8.1 g/dL   Albumin 4.9 3.6 - 5.1 g/dL   Globulin 3.1 1.9 - 3.7 g/dL (calc)   AG Ratio 1.6 1.0 - 2.5 (calc)   Total Bilirubin 0.4 0.2 - 1.2 mg/dL   Alkaline phosphatase (APISO) 50 36 - 130 U/L   AST 32 10 - 40 U/L   ALT 77 (H) 9 - 46 U/L  CBC with Differential/Platelet     Status: None   Collection Time: 08/11/20 12:00 AM  Result Value Ref Range   WBC 8.4 3.8 - 10.8 Thousand/uL   RBC 5.47 4.20 - 5.80 Million/uL   Hemoglobin 16.1 13.2 - 17.1 g/dL   HCT 40.9 81.1 - 91.4 %   MCV 87.9 80.0 - 100.0 fL   MCH 29.4 27.0 - 33.0 pg   MCHC 33.5 32.0 - 36.0 g/dL   RDW 78.2 95.6 - 21.3 %   Platelets 387 140 - 400 Thousand/uL   MPV 8.6 7.5 - 12.5 fL   Neutro Abs 5,426 1,500 - 7,800 cells/uL   Lymphs Abs 2,192 850 - 3,900 cells/uL   Absolute Monocytes 638 200 - 950 cells/uL    Eosinophils Absolute 109 15 - 500 cells/uL   Basophils Absolute 34 0 - 200 cells/uL   Neutrophils Relative % 64.6 %   Total Lymphocyte 26.1 %   Monocytes Relative 7.6 %   Eosinophils Relative 1.3 %   Basophils Relative 0.4 %  Vitamin B12     Status: None   Collection Time: 08/11/20 12:00 AM  Result Value Ref Range   Vitamin B-12 326 200 - 1,100 pg/mL    Comment: . Please Note: Although the reference range for vitamin B12 is [id number] pg/mL, it has been reported that between 5 and 10% of patients with values between 200 and 400 pg/mL may experience neuropsychiatric and hematologic abnormalities due to occult B12 deficiency; less than 1% of patients with values above 400 pg/mL will have symptoms. Marland Kitchen   VITAMIN D 25 Hydroxy (Vit-D Deficiency, Fractures)     Status: Abnormal   Collection Time: 08/11/20 12:00 AM  Result Value Ref Range   Vit D, 25-Hydroxy 20 (L) 30 - 100 ng/mL    Comment: Vitamin D Status         25-OH Vitamin D: . Deficiency:                    <20 ng/mL Insufficiency:             20 - 29 ng/mL Optimal:                 > or = 30 ng/mL . For 25-OH Vitamin D testing on patients on  D2-supplementation and patients for whom quantitation  of D2 and D3 fractions is  required, the [facility](TM) 25-OH VIT D, (D2,D3), LC/MS/MS is recommended: order  code 53614 (patients >46yrs). See Note 1 . Note 1 . For additional information, please refer to  http://education.[facility].com/faq/FAQ199  (This link is being provided for informational/ educational purposes only.)   TSH     Status: None   Collection Time: 08/11/20 12:00 AM  Result Value Ref Range   TSH 1.13 0.40 - 4.50 mIU/L  Hemoglobin A1c     Status: None   Collection Time: 08/11/20 12:00 AM  Result Value Ref Range   Hgb A1c MFr Bld 5.2 <5.7 % of total Hgb    Comment: For the purpose of screening for the presence of diabetes: . <5.7%       Consistent with the absence of diabetes 5.7-6.4%    Consistent  with increased risk for diabetes             (prediabetes) > or =6.5%  Consistent with diabetes . This assay result is consistent with a decreased risk of diabetes. . Currently, no consensus exists regarding use of hemoglobin A1c for diagnosis of diabetes in children. . According to American Diabetes Association (ADA) guidelines, hemoglobin A1c <7.0% represents optimal control in non-pregnant diabetic patients. Different metrics may apply to specific patient populations.  Standards of Medical Care in Diabetes(ADA). .    Mean Plasma Glucose 103 mg/dL   eAG (mmol/L) 5.7 mmol/L  HIV Antibody (routine testing w rflx)     Status: None   Collection Time: 08/11/20 12:00 AM  Result Value Ref Range   HIV 1&2 Ab, 4th Generation NON-REACTIVE NON-REACTI    Comment: HIV-1 antigen and HIV-1/HIV-2 antibodies were not detected. There is no laboratory evidence of HIV infection. Marland Kitchen PLEASE NOTE: This information has been disclosed to you from records whose confidentiality may be protected by state law.  If your state requires such protection, then the state law prohibits you from making any further disclosure of the information without the specific written consent of the person to whom it pertains, or as otherwise permitted by law. A general authorization for the release of medical or other information is NOT sufficient for this purpose. . For additional information please refer to http://education.[facility].com/faq/FAQ106 (This link is being provided for informational/ educational purposes only.) . Marland Kitchen The performance of this assay has not been clinically validated in patients less than 52 years old. .      PHQ2/9: Depression screen [facility] 2/9 10/09/2020 08/11/2020 05/08/2020 04/29/2020 03/23/2020  Decreased Interest 1 0 0 0 1  Down, Depressed, Hopeless 0 0 0 0 0  PHQ - 2 Score 1 0 0 0 1  Altered sleeping 2 2 2  - 1  Tired, decreased energy 0 - 2 - 1  Change in appetite 0 0 0 - 0   Feeling bad or failure about yourself  1 1 0 - 0  Trouble concentrating 1 1 0 - 1  Moving slowly or fidgety/restless 0 0 0 - 0  Suicidal thoughts 0 0 0 - 0  PHQ-9 Score 5 4 4  - 4  Difficult doing work/chores - - Not difficult at all - Somewhat difficult  Some recent data might be hidden    phq 9 is positive   Fall Risk: Fall Risk  10/09/2020 08/11/2020 05/08/2020 04/29/2020 03/26/2020  Falls in the past year? 0 0 0 0 0  Number falls in past yr: 0 0 0 0 -  Injury with Fall? 0 0 0 0 -  Risk for fall due to : - - - - -  Follow up - - - Falls evaluation completed -    Functional Status Survey: Is the patient deaf or have difficulty hearing?: No Does the patient have difficulty seeing, even when wearing glasses/contacts?: Yes Does the patient have difficulty concentrating, remembering, or making decisions?: No Does the patient have difficulty walking or climbing stairs?: No Does the patient have difficulty dressing or bathing?: No Does the patient have difficulty doing errands alone such as visiting a doctor's office or shopping?: No    Assessment & Plan  1. B12 deficiency  - Cyanocobalamin (B-12) 1000 MCG SUBL; Place 1 tablet under the tongue 3 (three) times a week.  Dispense: 100 tablet; Refill: 1  2. Vitamin D deficiency  - Cholecalciferol (VITAMIN D3) 50 MCG (2000 UT) capsule; Take 1 capsule (2,000 Units total) by mouth daily.  Dispense: 100 capsule; Refill: 0  3. Migraine without aura and without status migrainosus, not intractable  - divalproex (DEPAKOTE) 250 MG DR tablet; Take 1 tablet (250 mg total) by mouth 2 (two) times daily.  Dispense: 180 tablet; Refill: 0 - Rimegepant Sulfate (NURTEC) 75 MG TBDP; Take 1 tablet by mouth every other day. For prevention of migraine  Dispense: 16 tablet; Refill: 2  4. Mild episode of recurrent major depressive disorder (HCC)  - amphetamine-dextroamphetamine (ADDERALL) 10 MG tablet; Take 1 tablet (10 mg total) by mouth 2 (two) times  daily.  Dispense: 180 tablet; Refill: 0  5. Dyslipidemia   6. Morning headache  Discussed sleep study , ESS is 7 so he will not qualify   7. Psychophysiological insomnia  - Daridorexant HCl (QUVIVIQ) 50 MG TABS; Take 1 tablet by mouth at bedtime. For sleep  Dispense: 30 tablet; Refill: 5

## 2020-10-09 ENCOUNTER — Other Ambulatory Visit: Payer: Self-pay

## 2020-10-09 ENCOUNTER — Encounter: Payer: Self-pay | Admitting: Family Medicine

## 2020-10-09 ENCOUNTER — Ambulatory Visit: Payer: BC Managed Care – PPO | Admitting: Family Medicine

## 2020-10-09 VITALS — BP 118/68 | HR 83 | Temp 97.6°F | Resp 16 | Ht 70.0 in | Wt 237.0 lb

## 2020-10-09 DIAGNOSIS — F330 Major depressive disorder, recurrent, mild: Secondary | ICD-10-CM | POA: Diagnosis not present

## 2020-10-09 DIAGNOSIS — G43009 Migraine without aura, not intractable, without status migrainosus: Secondary | ICD-10-CM | POA: Diagnosis not present

## 2020-10-09 DIAGNOSIS — E538 Deficiency of other specified B group vitamins: Secondary | ICD-10-CM | POA: Diagnosis not present

## 2020-10-09 DIAGNOSIS — E559 Vitamin D deficiency, unspecified: Secondary | ICD-10-CM | POA: Diagnosis not present

## 2020-10-09 DIAGNOSIS — F5104 Psychophysiologic insomnia: Secondary | ICD-10-CM

## 2020-10-09 DIAGNOSIS — R519 Headache, unspecified: Secondary | ICD-10-CM

## 2020-10-09 DIAGNOSIS — E785 Hyperlipidemia, unspecified: Secondary | ICD-10-CM

## 2020-10-09 MED ORDER — QUVIVIQ 50 MG PO TABS: 1.0000 | ORAL_TABLET | Freq: Every evening | ORAL | 5 refills | Status: DC

## 2020-10-09 MED ORDER — DIVALPROEX SODIUM 250 MG PO DR TAB: 250.0000 mg | DELAYED_RELEASE_TABLET | Freq: Two times a day (BID) | ORAL | 0 refills | Status: DC

## 2020-10-09 MED ORDER — VITAMIN D3 50 MCG (2000 UT) PO CAPS: 2000.0000 [IU] | ORAL_CAPSULE | Freq: Every day | ORAL | 0 refills | Status: DC

## 2020-10-09 MED ORDER — AMPHETAMINE-DEXTROAMPHETAMINE 10 MG PO TABS: 10.0000 mg | ORAL_TABLET | Freq: Two times a day (BID) | ORAL | 0 refills | Status: DC

## 2020-10-09 MED ORDER — NURTEC 75 MG PO TBDP: 1.0000 | ORAL_TABLET | ORAL | 2 refills | Status: DC

## 2020-10-09 MED ORDER — B-12 1000 MCG SL SUBL: 1.0000 | SUBLINGUAL_TABLET | SUBLINGUAL | 1 refills | Status: DC

## 2020-10-23 ENCOUNTER — Other Ambulatory Visit: Payer: Self-pay

## 2020-10-23 DIAGNOSIS — F330 Major depressive disorder, recurrent, mild: Secondary | ICD-10-CM

## 2020-10-23 MED ORDER — AMPHETAMINE-DEXTROAMPHETAMINE 10 MG PO TABS: 10.0000 mg | ORAL_TABLET | Freq: Two times a day (BID) | ORAL | 0 refills | Status: DC

## 2020-11-10 ENCOUNTER — Ambulatory Visit: Payer: BC Managed Care – PPO | Admitting: Family Medicine

## 2020-12-14 ENCOUNTER — Other Ambulatory Visit: Payer: Self-pay

## 2020-12-14 DIAGNOSIS — F5104 Psychophysiologic insomnia: Secondary | ICD-10-CM

## 2020-12-14 MED ORDER — QUVIVIQ 50 MG PO TABS: 1.0000 | ORAL_TABLET | Freq: Every evening | ORAL | 5 refills | Status: DC

## 2020-12-14 NOTE — Telephone Encounter (Signed)
Pt needs refill on Quviviq 50 mg to be sent to [facility] [place].

## 2021-01-11 ENCOUNTER — Encounter: Payer: Self-pay | Admitting: Family Medicine

## 2021-01-11 ENCOUNTER — Telehealth (INDEPENDENT_AMBULATORY_CARE_PROVIDER_SITE_OTHER): Payer: BC Managed Care – PPO | Admitting: Family Medicine

## 2021-01-11 VITALS — Ht 70.0 in | Wt 237.0 lb

## 2021-01-11 DIAGNOSIS — E66812 Obesity, class 2: Secondary | ICD-10-CM

## 2021-01-11 DIAGNOSIS — G43009 Migraine without aura, not intractable, without status migrainosus: Secondary | ICD-10-CM | POA: Diagnosis not present

## 2021-01-11 DIAGNOSIS — E559 Vitamin D deficiency, unspecified: Secondary | ICD-10-CM | POA: Diagnosis not present

## 2021-01-11 DIAGNOSIS — Z6835 Body mass index (BMI) 35.0-35.9, adult: Secondary | ICD-10-CM

## 2021-01-11 DIAGNOSIS — E538 Deficiency of other specified B group vitamins: Secondary | ICD-10-CM

## 2021-01-11 DIAGNOSIS — E785 Hyperlipidemia, unspecified: Secondary | ICD-10-CM

## 2021-01-11 DIAGNOSIS — F325 Major depressive disorder, single episode, in full remission: Secondary | ICD-10-CM | POA: Diagnosis not present

## 2021-01-11 DIAGNOSIS — E6609 Other obesity due to excess calories: Secondary | ICD-10-CM

## 2021-01-11 MED ORDER — NURTEC 75 MG PO TBDP: 1.0000 | ORAL_TABLET | ORAL | 2 refills | Status: DC

## 2021-01-11 MED ORDER — ESCITALOPRAM OXALATE 10 MG PO TABS: 10.0000 mg | ORAL_TABLET | Freq: Every day | ORAL | 1 refills | Status: DC

## 2021-01-11 NOTE — Progress Notes (Signed)
Name: Gregory Matthews   MRN: [id number]    DOB: Jun 28, 1980   Date:01/11/2021       Progress Note  Subjective  Chief Complaint  Chief Complaint  Patient presents with   Follow-up    I connected with  Gregory Matthews  on 01/11/21 at  3:40 PM EDT by a video enabled telemedicine application and verified that I am speaking with the correct person using two identifiers.  I discussed the limitations of evaluation and management by telemedicine and the availability of in person appointments. The patient expressed understanding and agreed to proceed with the virtual visit  Staff also discussed with the patient that there may be a patient responsible charge related to this service. 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  His Phq 9 has been stable. Marland KitchenHe is still taking Depakote and also Lexapro daily .  He states he has not had a down days lately  He noticed some tingling on tip of his penis when he finishs an orgarms or after voiding, not very bothersome , started a couple of weeks ago, no new medications. Reassurance given for now and to monitor , he has lost weight and has more stamina during sex    Insomnia: he has tried Hydroxizine and Trazodone but did not work well for him, he took Temazepam but stopped, not sure the reason, he was doing well on Melatonin but stopped working, he is now on Quviviq and has been able to fall and stay asleep    Migraine:We have tried Topamax and Imitrex. He has stopped both medication secondary to side effects. Topamax he stopped because of change in taste and Imitrex, because flushing sensation and chest tightness. Episodes of migraine are described as a dull  headache, followed by nausea and the pain intensifies and becomes throbbing or  pounding sensation in  different parts of his head.  It is associated with photophobia, phonophobia and movement - such as walking. He stopped Elavil, and states morning headaches only a few times every couple weeks and takes otc medications for that, migraine headaches are not very often either and takes Nurtec prn .about once a week     Dyslipidemia:    The 10-year ASCVD risk score (Arnett DK, et al., 2019) is: 1.5%   Values used to calculate the score:     Age: 40 years     Sex: Male     Is Non-Hispanic African American: No     Diabetic: No     Tobacco smoker: No     Systolic Blood Pressure: 118 mmHg     Is BP treated: No     HDL Cholesterol: 40 mg/dL     Total Cholesterol: 202 mg/dL    M42 deficiency: reviewed last labs, need to take otc B12 a few times a week.    Vitamin D deficiency: he is not taking supplements, advised him to get it.    Obesity: he is doing a weight loss challenge with his wife with the assistance of a personal trainer also following a meal plan and is doing well, losing weight, and also measurements His initial weight was 245 lbs and today at home was 234 lbs. He states his clothes are fitting better    Patient Active Problem List   Diagnosis Date  Noted   Severe episode of recurrent major depressive disorder, without psychotic features (HCC) 07/17/2018   Headache, migraine 06/08/2015   Obesity (BMI 30.0-34.9) 06/08/2015   Allergic rhinitis, seasonal 07/26/2013    Past Surgical History:  Procedure Laterality Date   HAIR TRANSPLANT     RHINOPLASTY     TONSILLECTOMY      Family History  Problem Relation Age of Onset   Cancer Mother        Breast   Hypertension Father    Heart disease Father    Heart attack Father    Cancer Maternal Aunt        Breast   Hyperlipidemia Maternal Grandmother    Parkinson's disease Paternal Grandfather     Social History   Socioeconomic History   Marital status: Married    Spouse name: Gregory Matthews    Number of children: 0   Years of  education: Not on file   Highest education level: Bachelor's degree (e.g., BA, AB, BS)  Occupational History   Occupation: Runner, broadcasting/film/video   Tobacco Use   Smoking status: Never   Smokeless tobacco: Never  Vaping Use   Vaping Use: Never used  Substance and Sexual Activity   Alcohol use: No    Alcohol/week: 0.0 standard drinks   Drug use: No   Sexual activity: Yes    Partners: Female  Other Topics Concern   Not on file  Social History Narrative   Married, he is a Manufacturing engineer Strain: Low Risk    Difficulty of Paying Living Expenses: Not hard at all  Food Insecurity: No Food Insecurity   Worried About Programme researcher, broadcasting/film/video in the Last Year: Never true   Barista in the Last Year: Never true  Transportation Needs: No Transportation Needs   Lack of Transportation (Medical): No   Lack of Transportation (Non-Medical): No  Physical Activity: Insufficiently Active   Days of Exercise per Week: 2 days   Minutes of Exercise per Session: 30 min  Stress: Stress Concern Present   Feeling of Stress : To some extent  Social Connections: Press photographer of Communication with Friends and Family: More than three times a week   Frequency of Social Gatherings with Friends and Family: More than three times a week   Attends Religious Services: More than 4 times per year   Active Member of [employer] or Organizations: Yes   Attends Engineer, structural: More than 4 times per year   Marital Status: Married  Catering manager Violence: Not At Risk   Fear of Current or Ex-Partner: No   Emotionally Abused: No   Physically Abused: No   Sexually Abused: No     Current Outpatient Medications:    amitriptyline (ELAVIL) 10 MG tablet, Take 1 tablet (10 mg total) by mouth at bedtime., Disp: 90 tablet, Rfl: 1   amphetamine-dextroamphetamine (ADDERALL) 10 MG tablet, Take 1 tablet (10 mg total) by mouth 2 (two) times daily., Disp: 180  tablet, Rfl: 0   Cholecalciferol (VITAMIN D3) 50 MCG (2000 UT) capsule, Take 1 capsule (2,000 Units total) by mouth daily., Disp: 100 capsule, Rfl: 0   Cyanocobalamin (B-12) 1000 MCG SUBL, Place 1 tablet under the tongue 3 (three) times a week., Disp: 100 tablet, Rfl: 1   Daridorexant HCl (QUVIVIQ) 50 MG TABS, Take 1 tablet by mouth at bedtime. For sleep, Disp: 30 tablet, Rfl: 5   divalproex (DEPAKOTE) 250  MG DR tablet, Take 1 tablet (250 mg total) by mouth 2 (two) times daily., Disp: 180 tablet, Rfl: 0   escitalopram (LEXAPRO) 10 MG tablet, Take 1 tablet (10 mg total) by mouth daily., Disp: 90 tablet, Rfl: 1   Melatonin 5 MG CHEW, Chew 1 tablet by mouth daily., Disp: , Rfl:    Rimegepant Sulfate (NURTEC) 75 MG TBDP, Take 1 tablet by mouth every other day. For prevention of migraine, Disp: 16 tablet, Rfl: 2  Allergies  Allergen Reactions   Penicillins Rash    I personally reviewed active problem list, medication list, allergies, family history with the patient/caregiver today.   ROS  Ten systems reviewed and is negative except as mentioned in HPI   Objective  Virtual encounter, vitals not obtained.  Body mass index is 34.01 kg/m.  Physical Exam  Awake alert and oriented    PHQ2/9: Depression screen [facility] 2/9 01/11/2021 10/09/2020 08/11/2020 05/08/2020 04/29/2020  Decreased Interest 0 1 0 0 0  Down, Depressed, Hopeless 0 0 0 0 0  PHQ - 2 Score 0 1 0 0 0  Altered sleeping 1 2 2 2  -  Tired, decreased energy 1 0 - 2 -  Change in appetite 0 0 0 0 -  Feeling bad or failure about yourself  0 1 1 0 -  Trouble concentrating 1 1 1  0 -  Moving slowly or fidgety/restless 0 0 0 0 -  Suicidal thoughts 0 0 0 0 -  PHQ-9 Score 3 5 4 4  -  Difficult doing work/chores Somewhat difficult - - Not difficult at all -  Some recent data might be hidden   PHQ-2/9 Result is positive.    Fall Risk: Fall Risk  01/11/2021 10/09/2020 08/11/2020 05/08/2020 04/29/2020  Falls in the past year? 0 0 0 0 0  Number  falls in past yr: - 0 0 0 0  Injury with Fall? - 0 0 0 0  Risk for fall due to : - - - - -  Follow up - - - - Falls evaluation completed     Assessment & Plan  1. Migraine without aura and without status migrainosus, not intractable  - Rimegepant Sulfate (NURTEC) 75 MG TBDP; Take 1 tablet by mouth every other day. For prevention of migraine  Dispense: 16 tablet; Refill: 2  2.Major Depression in Remission  (HCC)  - escitalopram (LEXAPRO) 10 MG tablet; Take 1 tablet (10 mg total) by mouth daily.  Dispense: 90 tablet; Refill: 1  3. B12 deficiency   4. Vitamin D deficiency  Needs to resume Vitamin D   5. Dyslipidemia    I discussed the assessment and treatment plan with the patient. The patient was provided an opportunity to ask questions and all were answered. The patient agreed with the plan and demonstrated an understanding of the instructions.  The patient was advised to call back or seek an in-person evaluation if the symptoms worsen or if the condition fails to improve as anticipated.  I provided 25 minutes of non-face-to-face time during this encounter.

## 2021-01-20 ENCOUNTER — Encounter: Payer: Self-pay | Admitting: General Surgery

## 2021-01-24 ENCOUNTER — Other Ambulatory Visit: Payer: Self-pay

## 2021-01-24 ENCOUNTER — Encounter: Payer: Self-pay | Admitting: Emergency Medicine

## 2021-01-24 ENCOUNTER — Ambulatory Visit
Admission: EM | Admit: 2021-01-24 | Discharge: 2021-01-24 | Disposition: A | Discharge status: Home / Self Care | Payer: BC Managed Care – PPO | Attending: Emergency Medicine | Admitting: Emergency Medicine

## 2021-01-24 DIAGNOSIS — B9689 Other specified bacterial agents as the cause of diseases classified elsewhere: Secondary | ICD-10-CM

## 2021-01-24 DIAGNOSIS — J019 Acute sinusitis, unspecified: Secondary | ICD-10-CM

## 2021-01-24 MED ORDER — PREDNISONE 10 MG PO TABS: ORAL_TABLET | ORAL | 0 refills | Status: AC

## 2021-01-24 MED ORDER — DOXYCYCLINE HYCLATE 100 MG PO CAPS: 100.0000 mg | ORAL_CAPSULE | Freq: Two times a day (BID) | ORAL | 0 refills | Status: AC

## 2021-01-24 NOTE — ED Triage Notes (Signed)
01/16/2021 started with sinus pressure, developed cough, minimal production of phlegm.  Phlegm is yellow/green color.  Throat is hurting worse this morning.  Feels like something in throat.  No issues with eating , drinking, or breathing.

## 2021-01-24 NOTE — Discharge Instructions (Signed)
Sinusitis is an infection of the lining of the sinus cavities in your head. Sinusitis often follows a cold. It causes pain and pressure in your head and face.  Take antibiotics (doxycycline) as directed. Do not stop taking them just because you feel better. You need to take the full course of antibiotics. Take your prednisone as prescribed. Rest, push lots of fluids (especially water), and utilize supportive care for symptoms. Breathe warm, moist area from a steamy shower, hot bath, or sink filled with hot water.  Avoid cold, dry air.  Using a humidifier in your home may help.  Follow the directions for cleaning the machine. Put a hot, wet towel or a warm gel pack on your face 3-4 times a day for 5-10 minutes each time. You may take acetaminophen (Tylenol) every 4-6 hours and ibuprofen every 6-8 hours for muscle pain, joint pain, headaches (you may also alternate these medications). Sudafed (pseudophedrine) is sold behind the counter and can help reduce nasal pressure; avoid taking this if you have high blood pressure or feel jittery. Sudafed PE (phenylephrine) can be a helpful, short-term, over-the-counter alternative to limit side effects or if you have high blood pressure.  Flonase nasal spray can help alleviate congestion and sinus pressure. Many patients choose Afrin as a nasal decongestant; do not use for more than 3 days for risk of rebound (increased symptoms after stopping medication).  Saline nasal sprays or rinses can also help nasal congestion (use bottled or sterile water). Warm tea with lemon and honey can sooth sore throat and cough, as can cough drops.   Return to clinic for new or worse swelling or redness in your face or around your eyes, or if you have a new or higher fever.

## 2021-01-24 NOTE — ED Provider Notes (Addendum)
CHIEF COMPLAINT:   Chief Complaint  Patient presents with   Cough     SUBJECTIVE/HPI:   Cough A very pleasant 40 y.o.Male presents today with sinus pressure, cough and minor phlegm production which is yellow and green in color.  Symptoms started on 01/16/2021.  Patient reports a sore throat that has worsened this morning.  No difficulty breathing, drinking, eating or swallowing. Patient does not report any shortness of breath, chest pain, palpitations, visual changes, weakness, tingling, headache, nausea, vomiting, diarrhea, fever, chills.   has a past medical history of Acute pharyngitis, Hay fever, Migraine, and Sinusitis, acute.  ROS:  Review of Systems  Respiratory:  Positive for cough.   See Subjective/HPI Medications, Allergies and Problem List personally reviewed in Epic today OBJECTIVE:   Vitals:   01/24/21 1000  BP: 120/79  Pulse: 70  Resp: 18  Temp: 98.8 F (37.1 C)  SpO2: 95%    Physical Exam   General: Appears well-developed and well-nourished. No acute distress.  HEENT Head: Normocephalic and atraumatic.  + frontal, maxillary sinus and nasal bridge tenderness noted to palpation. Ears: Hearing grossly intact, no drainage or visible deformity.  Nose: No nasal deviation.  Erythematous and congested nasal mucosa noted bilaterally without rhinorrhea Mouth/Throat: No stridor or tracheal deviation.  Non erythematous posterior pharynx noted with clear drainage present.  No white patchy exudate noted. Eyes: Conjunctivae and EOM are normal. No eye drainage or scleral icterus bilaterally.  Neck: Normal range of motion, neck is supple. No cervical, tonsillar or submandibular lymph nodes palpated. Cardiovascular: Normal rate. Regular rhythm; no murmurs, gallops, or rubs.  Pulm/Chest: No respiratory distress. Breath sounds normal bilaterally without wheezes, rhonchi, or rales.  Neurological: Alert and oriented to person, place, and time.  Skin: Skin is warm and dry.  No  rashes, lesions, abrasions or bruising noted to skin.   Psychiatric: Normal mood, affect, behavior, and thought content.   Vital signs and nursing note reviewed.   Patient stable and cooperative with examination. PROCEDURES:    LABS/X-RAYS/EKG/MEDS:   No results found for any visits on 01/24/21.  MEDICAL DECISION MAKING:   Patient presents with sinus pressure, cough and minor phlegm production which is yellow and green in color.  Symptoms started on 01/16/2021.  Patient reports a sore throat that has worsened this morning.  No difficulty breathing, drinking, eating or swallowing. Patient does not report any shortness of breath, chest pain, palpitations, visual changes, weakness, tingling, headache, nausea, vomiting, diarrhea, fever, chills.  Given symptoms along with assessment findings, likely bacterial sinusitis.  Rx doxycycline and a low-dose prednisone taper to the patient's preferred pharmacy to help with inflammation and infection.  Advised about home treatment and care to include fluids, rest, Sudafed, Tylenol versus ibuprofen and Mucinex.  Advised to return to clinic for new or worse swelling redness in his face, around his eyes or new high fever.  Work note provided.  Patient verbalized understanding and agreed with treatment plan.  Patient stable upon discharge. ASSESSMENT/PLAN:  1. Acute bacterial sinusitis - doxycycline (VIBRAMYCIN) 100 MG capsule; Take 1 capsule (100 mg total) by mouth 2 (two) times daily for 7 days.  Dispense: 14 capsule; Refill: 0 - predniSONE (DELTASONE) 10 MG tablet; Take 3 tablets (30 mg total) by mouth daily with breakfast for 2 days, THEN 2 tablets (20 mg total) daily with breakfast for 2 days, THEN 1 tablet (10 mg total) daily with breakfast for 2 days.  Dispense: 12 tablet; Refill: 0 Instructions about new medications and  side effects provided.  Plan:   Discharge Instructions      Sinusitis is an infection of the lining of the sinus cavities in your  head. Sinusitis often follows a cold. It causes pain and pressure in your head and face.  Take antibiotics (doxycycline) as directed. Do not stop taking them just because you feel better. You need to take the full course of antibiotics. Take your prednisone as prescribed. Rest, push lots of fluids (especially water), and utilize supportive care for symptoms. Breathe warm, moist area from a steamy shower, hot bath, or sink filled with hot water.  Avoid cold, dry air.  Using a humidifier in your home may help.  Follow the directions for cleaning the machine. Put a hot, wet towel or a warm gel pack on your face 3-4 times a day for 5-10 minutes each time. You may take acetaminophen (Tylenol) every 4-6 hours and ibuprofen every 6-8 hours for muscle pain, joint pain, headaches (you may also alternate these medications). Sudafed (pseudophedrine) is sold behind the counter and can help reduce nasal pressure; avoid taking this if you have high blood pressure or feel jittery. Sudafed PE (phenylephrine) can be a helpful, short-term, over-the-counter alternative to limit side effects or if you have high blood pressure.  Flonase nasal spray can help alleviate congestion and sinus pressure. Many patients choose Afrin as a nasal decongestant; do not use for more than 3 days for risk of rebound (increased symptoms after stopping medication).  Saline nasal sprays or rinses can also help nasal congestion (use bottled or sterile water). Warm tea with lemon and honey can sooth sore throat and cough, as can cough drops.   Return to clinic for new or worse swelling or redness in your face or around your eyes, or if you have a new or higher fever.          Amalia Greenhouse, FNP 01/24/21 1034    Amalia Greenhouse, FNP 01/24/21 1035

## 2021-02-01 ENCOUNTER — Telehealth (INDEPENDENT_AMBULATORY_CARE_PROVIDER_SITE_OTHER): Payer: BC Managed Care – PPO | Admitting: Nurse Practitioner

## 2021-02-01 DIAGNOSIS — R051 Acute cough: Secondary | ICD-10-CM | POA: Diagnosis not present

## 2021-02-01 DIAGNOSIS — J069 Acute upper respiratory infection, unspecified: Secondary | ICD-10-CM | POA: Diagnosis not present

## 2021-02-01 MED ORDER — BENZONATATE 100 MG PO CAPS
200.0000 mg | ORAL_CAPSULE | Freq: Two times a day (BID) | ORAL | 0 refills | Status: DC | PRN
Start: 2021-02-01 — End: 2021-03-29

## 2021-02-01 NOTE — Progress Notes (Signed)
Name: Gregory Matthews   MRN: [id number]    DOB: January 09, 1981   Date:02/01/2021       Progress Note  Subjective  Chief Complaint  Cough and sinus congestion   I connected with  Gregory Matthews  on 02/01/21 at  4:00 PM EDT by a video enabled telemedicine application and verified that I am speaking with the correct person using two identifiers.  I discussed the limitations of evaluation and management by telemedicine and the availability of in person appointments. The patient expressed understanding and agreed to proceed with a virtual visit  Staff also discussed with the patient that there may be a patient responsible charge related to this service. Patient Location: work Dispensing optician: [facility] Additional Individuals present: alone  HPI  Cough/ sinus congestion:  He says that he has had a cough and sinus congestion for two weeks.  He denies any shortness of breath or fever.  He was seen at urgent care last Sunday.  He says he was prescribed prednisone and doxycycline. He completed the prescriptions.  He says he feels better but not back to 100%.  Discussed OTC treatments. Push fluids and get rest.   Patient Active Problem List   Diagnosis Date Noted   Severe episode of recurrent major depressive disorder, without psychotic features (HCC) 07/17/2018   Headache, migraine 06/08/2015   Obesity (BMI 30.0-34.9) 06/08/2015   Allergic rhinitis, seasonal 07/26/2013    Social History   Tobacco Use   Smoking status: Never   Smokeless tobacco: Never  Substance Use Topics   Alcohol use: No    Alcohol/week: 0.0 standard drinks     Current Outpatient Medications:    amphetamine-dextroamphetamine (ADDERALL) 10 MG tablet, Take 1 tablet (10 mg total) by mouth 2 (two) times daily., Disp: 180 tablet, Rfl: 0   Cholecalciferol (VITAMIN D3) 50 MCG (2000 UT) capsule, Take 1 capsule (2,000 Units total) by mouth daily., Disp: 100 capsule, Rfl: 0   Cyanocobalamin (B-12) 1000 MCG SUBL, Place 1 tablet  under the tongue 3 (three) times a week., Disp: 100 tablet, Rfl: 1   Daridorexant HCl (QUVIVIQ) 50 MG TABS, Take 1 tablet by mouth at bedtime. For sleep, Disp: 30 tablet, Rfl: 5   divalproex (DEPAKOTE) 250 MG DR tablet, Take 1 tablet (250 mg total) by mouth 2 (two) times daily., Disp: 180 tablet, Rfl: 0   escitalopram (LEXAPRO) 10 MG tablet, Take 1 tablet (10 mg total) by mouth daily., Disp: 90 tablet, Rfl: 1   Melatonin 5 MG CHEW, Chew 1 tablet by mouth daily., Disp: , Rfl:    Rimegepant Sulfate (NURTEC) 75 MG TBDP, Take 1 tablet by mouth every other day. For prevention of migraine, Disp: 16 tablet, Rfl: 2  Allergies  Allergen Reactions   Penicillins Rash    I personally reviewed active problem list, medication list, allergies, notes from last encounter with the patient/caregiver today.  ROS  Constitutional: Negative for fever or weight change.  HEENT: Positive for nasal congestion Respiratory: Positive for cough, negative for shortness of breath.   Cardiovascular: Negative for chest pain or palpitations.  Gastrointestinal: Negative for abdominal pain, no bowel changes.  Musculoskeletal: Negative for gait problem or joint swelling.  Skin: Negative for rash.  Neurological: Negative for dizziness or headache.  No other specific complaints in a complete review of systems (except as listed in HPI above).   Objective  Virtual encounter, vitals not obtained.  There is no height or weight on file to calculate BMI.  Nursing Note and Vital Signs reviewed.  Physical Exam  Awake alert and oriented, speaking in complete sentences  No results found for this or any previous visit (from the past 72 hour(s)).  Assessment & Plan  1. Upper respiratory tract infection, unspecified type -continue mucinex -start Claritin and Flonase -push fluids and rest - benzonatate (TESSALON) 100 MG capsule; Take 2 capsules (200 mg total) by mouth 2 (two) times daily as needed for cough.  Dispense: 20  capsule; Refill: 0  2. Acute cough -continue mucinex -start Claritin and Flonase -push fluids and rest - benzonatate (TESSALON) 100 MG capsule; Take 2 capsules (200 mg total) by mouth 2 (two) times daily as needed for cough.  Dispense: 20 capsule; Refill: 0    -Red flags and when to present for emergency care or RTC including fever >101.71F, chest pain, shortness of breath, new/worsening/un-resolving symptoms, reviewed with patient at time of visit. Follow up and care instructions discussed and provided in AVS. - I discussed the assessment and treatment plan with the patient. The patient was provided an opportunity to ask questions and all were answered. The patient agreed with the plan and demonstrated an understanding of the instructions.  I provided 15 minutes of non-face-to-face time during this encounter.  Berniece Salines, FNP

## 2021-03-04 IMAGING — CR DG CHEST 2V
1 series · 2 of 2 positions shown · non-contrast
Comparison: 05/04/2017

CLINICAL DATA: Cough, fatigue, and chest heaviness. [id number]
positive.

EXAM:
CHEST - 2 VIEW

[Series 1: dg chest 2 view · 0.14mm/px · 2 of 2 slices shown]
[im 1/2]
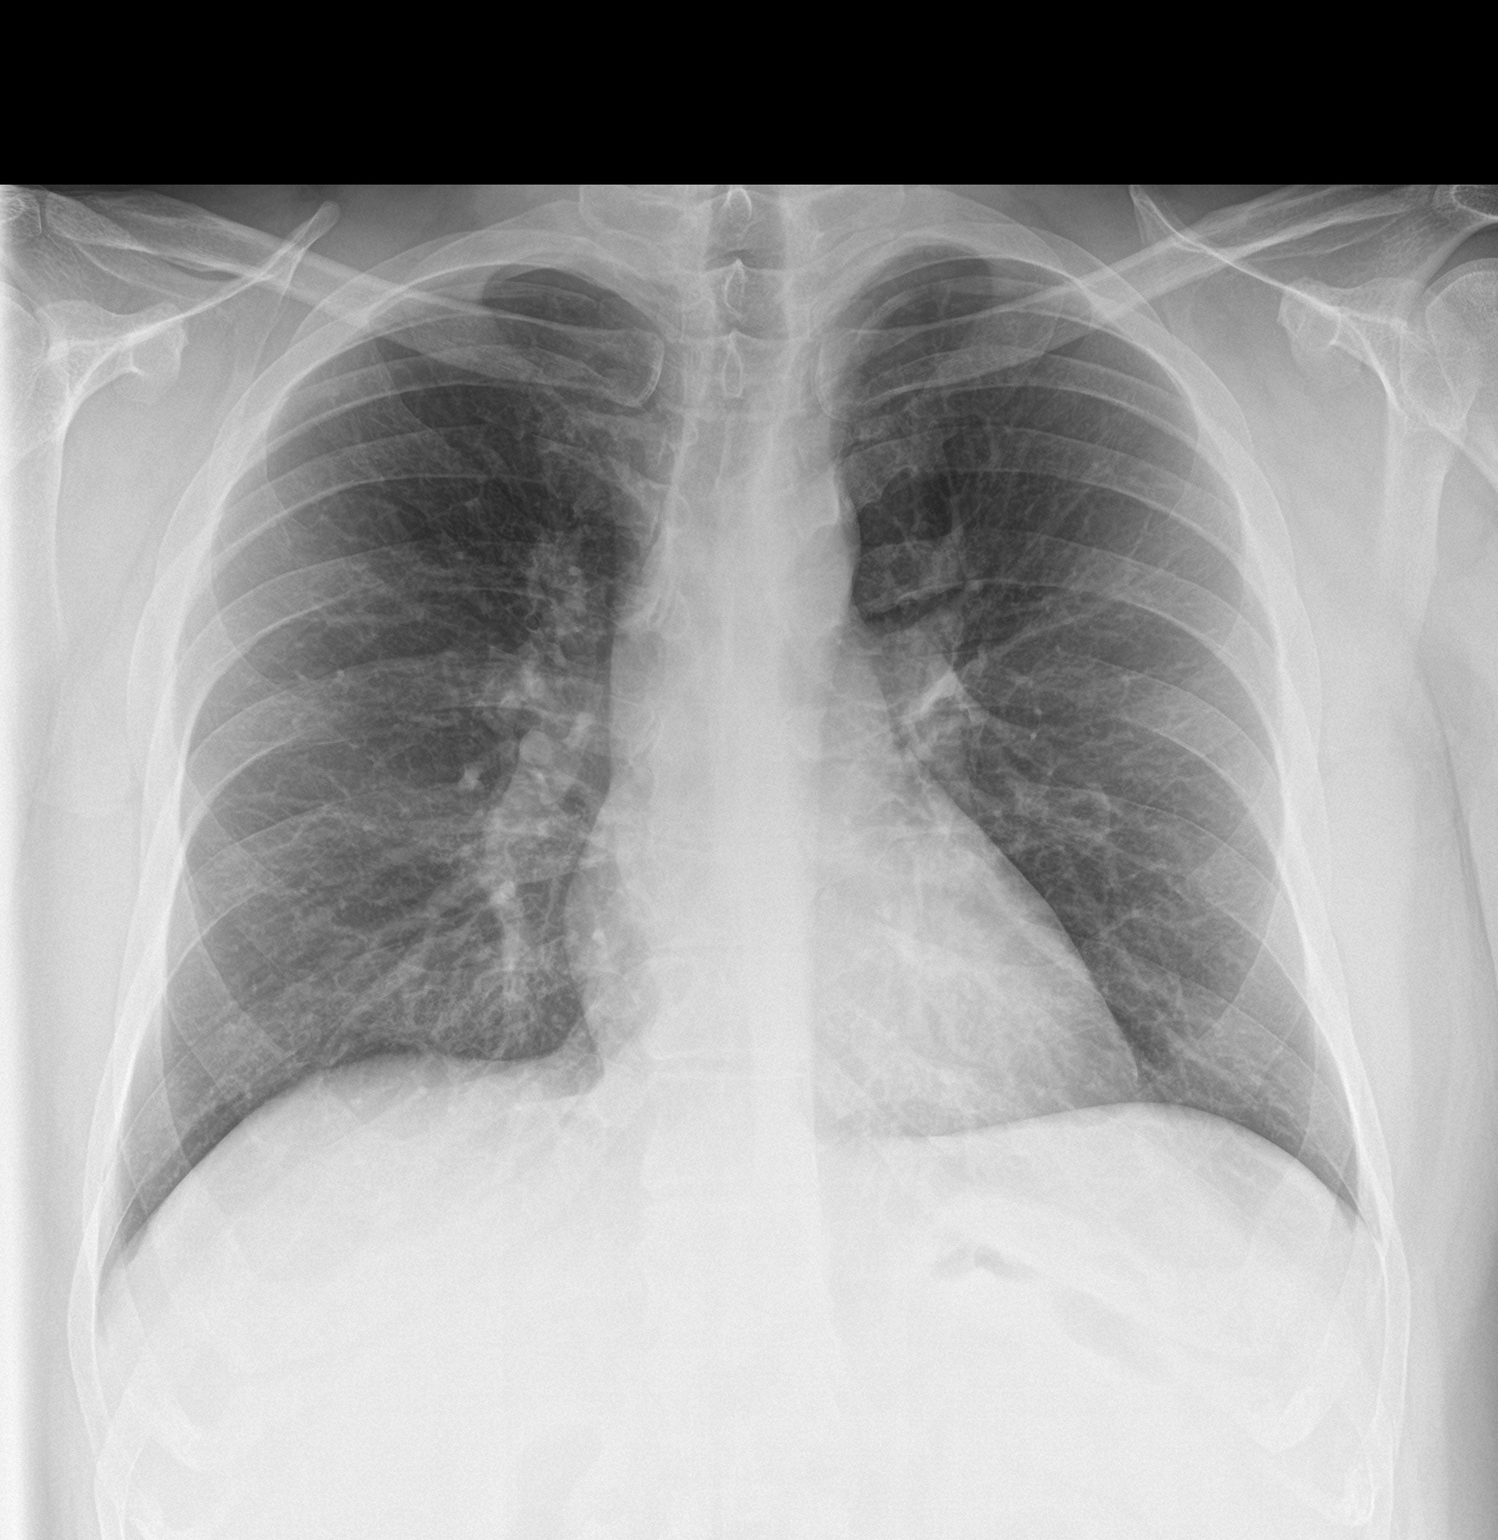
[im 2/2]
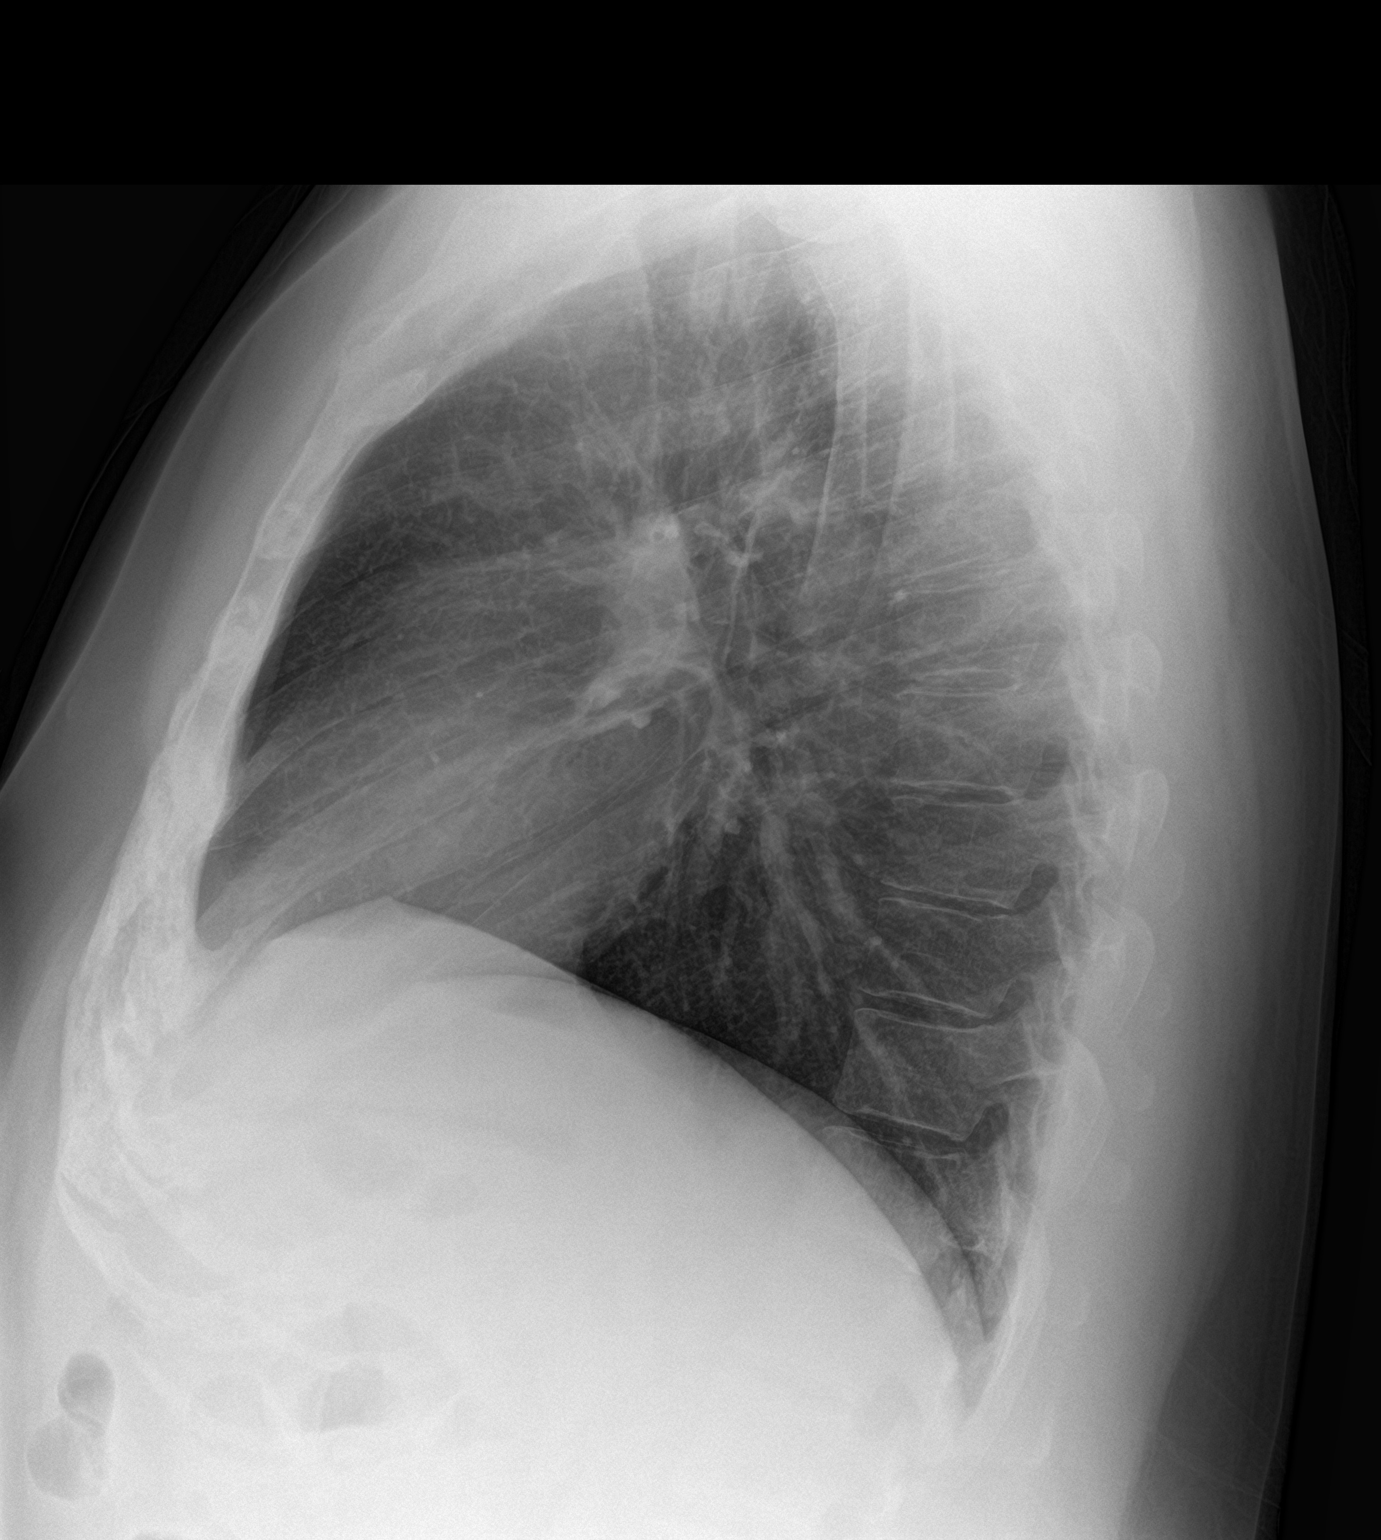

[2 of 2 positions shown; findings below may reference images not displayed]

FINDINGS: The cardiomediastinal silhouette is within normal limits. The lungs
are well inflated and clear. There is no evidence of pleural
effusion or pneumothorax. No acute osseous abnormality is
identified.
IMPRESSION: No active cardiopulmonary disease.

## 2021-03-11 ENCOUNTER — Telehealth: Payer: Self-pay

## 2021-03-11 NOTE — Telephone Encounter (Signed)
Pt called stating he got a bill from [facility] 08/11/20 for his CPE. [facility] and his insurance are stating the diagnosis codes need to be changed to reflect CPE before they will pay. Please advise pt.

## 2021-03-29 ENCOUNTER — Telehealth: Payer: Self-pay

## 2021-03-29 ENCOUNTER — Other Ambulatory Visit: Payer: Self-pay

## 2021-03-29 ENCOUNTER — Other Ambulatory Visit: Payer: Self-pay | Admitting: Family Medicine

## 2021-03-29 DIAGNOSIS — F330 Major depressive disorder, recurrent, mild: Secondary | ICD-10-CM

## 2021-03-29 DIAGNOSIS — F325 Major depressive disorder, single episode, in full remission: Secondary | ICD-10-CM

## 2021-03-29 MED ORDER — AMPHETAMINE-DEXTROAMPHETAMINE 10 MG PO TABS: 10.0000 mg | ORAL_TABLET | Freq: Two times a day (BID) | ORAL | 0 refills | Status: DC

## 2021-03-29 MED ORDER — ESCITALOPRAM OXALATE 10 MG PO TABS: 10.0000 mg | ORAL_TABLET | Freq: Every day | ORAL | 1 refills | Status: DC

## 2021-03-29 NOTE — Telephone Encounter (Signed)
Pt needs refill on Adderell to be sent to [facility], only one dose remaining.  Also needs depression meds sent in to [facility]. Pt has an appt on 04/06/21

## 2021-04-02 ENCOUNTER — Telehealth (INDEPENDENT_AMBULATORY_CARE_PROVIDER_SITE_OTHER): Payer: BC Managed Care – PPO | Admitting: Nurse Practitioner

## 2021-04-02 ENCOUNTER — Other Ambulatory Visit: Payer: Self-pay

## 2021-04-02 ENCOUNTER — Encounter: Payer: Self-pay | Admitting: Nurse Practitioner

## 2021-04-02 DIAGNOSIS — J069 Acute upper respiratory infection, unspecified: Secondary | ICD-10-CM | POA: Diagnosis not present

## 2021-04-02 DIAGNOSIS — J029 Acute pharyngitis, unspecified: Secondary | ICD-10-CM | POA: Diagnosis not present

## 2021-04-02 LAB — POCT INFLUENZA A/B
Influenza A, POC: NEGATIVE
Influenza B, POC: NEGATIVE

## 2021-04-02 LAB — POCT RAPID STREP A (OFFICE): Rapid Strep A Screen: NEGATIVE

## 2021-04-02 MED ORDER — BENZONATATE 100 MG PO CAPS: 200.0000 mg | ORAL_CAPSULE | Freq: Three times a day (TID) | ORAL | 0 refills | Status: DC | PRN

## 2021-04-02 NOTE — Progress Notes (Signed)
Name: Gregory Matthews   MRN: [id number]    DOB: Dec 21, 1980   Date:04/02/2021       Progress Note  Subjective  Chief Complaint  Chief Complaint  Patient presents with   Sore Throat    Hard to swallow, Congested, runny nose, facial  pressure since Tuesday     I connected with  Petra Sargeant Coyne  on 04/02/21 at 10:15 am by a video enabled telemedicine application and verified that I am speaking with the correct person using two identifiers.  I discussed the limitations of evaluation and management by telemedicine and the availability of in person appointments. The patient expressed understanding and agreed to proceed with a virtual visit  Staff also discussed with the patient that there may be a patient responsible charge related to this service. Patient Location: home Provider Location: [facility] Additional Individuals present: alone  HPI  [id number] night started with a little congestion.  Wednesday is when he says his symptoms really started.  He says he has a productive cough, nasal congestion, sore throat, chills and low grade fever. He says he now has a sore throat.  He says it hurts to swallow. He denies any shortness of breath.  He is currently taking dayquil, mucinex, zicam and nasal spray.  He is going to come by and get tested for flu, covid and strep. Discussed otc treatments for symptoms and will send in a prescription for cough.   Patient Active Problem List   Diagnosis Date Noted   Severe episode of recurrent major depressive disorder, without psychotic features (HCC) 07/17/2018   Headache, migraine 06/08/2015   Obesity (BMI 30.0-34.9) 06/08/2015   Allergic rhinitis, seasonal 07/26/2013    Social History   Tobacco Use   Smoking status: Never   Smokeless tobacco: Never  Substance Use Topics   Alcohol use: No    Alcohol/week: 0.0 standard drinks     Current Outpatient Medications:    amphetamine-dextroamphetamine (ADDERALL) 10 MG tablet, Take 1 tablet (10 mg total)  by mouth 2 (two) times daily., Disp: 60 tablet, Rfl: 0   Cholecalciferol (VITAMIN D3) 50 MCG (2000 UT) capsule, Take 1 capsule (2,000 Units total) by mouth daily., Disp: 100 capsule, Rfl: 0   Cyanocobalamin (B-12) 1000 MCG SUBL, Place 1 tablet under the tongue 3 (three) times a week., Disp: 100 tablet, Rfl: 1   Daridorexant HCl (QUVIVIQ) 50 MG TABS, Take 1 tablet by mouth at bedtime. For sleep, Disp: 30 tablet, Rfl: 5   divalproex (DEPAKOTE) 250 MG DR tablet, Take 1 tablet (250 mg total) by mouth 2 (two) times daily., Disp: 180 tablet, Rfl: 0   escitalopram (LEXAPRO) 10 MG tablet, Take 1 tablet (10 mg total) by mouth daily., Disp: 90 tablet, Rfl: 1   Melatonin 5 MG CHEW, Chew 1 tablet by mouth daily., Disp: , Rfl:    Rimegepant Sulfate (NURTEC) 75 MG TBDP, Take 1 tablet by mouth every other day. For prevention of migraine, Disp: 16 tablet, Rfl: 2  Allergies  Allergen Reactions   Penicillins Rash    I personally reviewed active problem list, medication list, allergies, notes from last encounter with the patient/caregiver today.  ROS  Constitutional: positive for fever, negative for weight change.  HEENT: positive for nasal congestion, sore throat Respiratory: Positive for cough, negative for shortness of breath.   Cardiovascular: Negative for chest pain or palpitations.  Gastrointestinal: Negative for abdominal pain, no bowel changes.  Musculoskeletal: Negative for gait problem or joint swelling.  Skin: Negative  for rash.  Neurological: Negative for dizziness or headache.  No other specific complaints in a complete review of systems (except as listed in HPI above).   Objective  Virtual encounter, vitals not obtained.  There is no height or weight on file to calculate BMI.  Nursing Note and Vital Signs reviewed.  Physical Exam  Awake, alert and oriented, speaking in complete sentences  No results found for this or any previous visit (from the past 72 hour(s)).  Assessment &  Plan  1. Viral upper respiratory tract infection -OTC treatments for symptoms - Novel Coronavirus, NAA ([facility]) - POCT Influenza A/B - POCT rapid strep A - benzonatate (TESSALON) 100 MG capsule; Take 2 capsules (200 mg total) by mouth 3 (three) times daily as needed for cough.  Dispense: 20 capsule; Refill: 0  2. Sore throat -gargle with salt water - Novel Coronavirus, NAA ([facility]) - POCT Influenza A/B - POCT rapid strep A   -Red flags and when to present for emergency care or RTC including fever >101.53F, chest pain, shortness of breath, new/worsening/un-resolving symptoms, reviewed with patient at time of visit. Follow up and care instructions discussed and provided in AVS. - I discussed the assessment and treatment plan with the patient. The patient was provided an opportunity to ask questions and all were answered. The patient agreed with the plan and demonstrated an understanding of the instructions.  I provided 15 minutes of non-face-to-face time during this encounter.  Berniece Salines, FNP

## 2021-04-03 LAB — NOVEL CORONAVIRUS, NAA: SARS-CoV-2, NAA: NOT DETECTED

## 2021-04-06 ENCOUNTER — Ambulatory Visit: Payer: BC Managed Care – PPO | Admitting: Family Medicine

## 2021-04-13 NOTE — Progress Notes (Signed)
Name: Gregory Matthews   MRN: [id number]    DOB: Dec 27, 1980   Date:04/14/2021       Progress Note  Subjective  Chief Complaint  Medication Refill  HPI  MDD/GAD:diagnosed 05/2019  he is getting marital counseling occasionally now that they are doing much better, but he still has solo sessions also but down to ever 2 months.His Phq 9 has been normal . .He is still taking Depakote and also Lexapro daily, plus Adderal for energy and focus  .   Insomnia: he has tried Hydroxizine , Trazodone and Temazepam but either had side effects of inefective currently on Quviviq and has been able to fall and stay asleep , no side effects    Migraine:We have tried Topamax and Imitrex. He has stopped both medication secondary to side effects. Topamax he stopped because of change in taste and Imitrex, because flushing sensation and chest tightness. Episodes of migraine are described as a dull  headache, followed by nausea and the pain intensifies and becomes throbbing or  pounding sensation in different parts of his head.  It is associated with photophobia, phonophobia and movement - such as walking. He stopped Elavil, and states morning headaches at least once a week that resolved with otc medication, he has migraine once every other week that improves with Nurtec , he is thinking about taking every other day to see if will decrease frequency of headaches    Dyslipidemia:    The 10-year ASCVD risk score (Arnett DK, et al., 2019) is: 1.7%   Values used to calculate the score:     Age: 40 years     Sex: Male     Is Non-Hispanic African American: No     Diabetic: No     Tobacco smoker: No     Systolic Blood Pressure: 126 mmHg     Is BP treated: No     HDL Cholesterol: 40 mg/dL     Total Cholesterol: 202 mg/dL    F64 deficiency: reviewed last labs, need to take otc B12 a few times a week.    Vitamin D deficiency: advised daily supplementation    Obesity: he is doing a weight loss challenge with his wife  with the assistance of a personal trainer also following a meal plan and is doing well, losing weight, and also measurements His initial weight was 245 lbs and today at home was 234 lbs. He states his clothes are fitting better    Patient Active Problem List   Diagnosis Date Noted   Severe episode of recurrent major depressive disorder, without psychotic features (HCC) 07/17/2018   Headache, migraine 06/08/2015   Obesity (BMI 30.0-34.9) 06/08/2015   Allergic rhinitis, seasonal 07/26/2013    Past Surgical History:  Procedure Laterality Date   HAIR TRANSPLANT     RHINOPLASTY     TONSILLECTOMY      Family History  Problem Relation Age of Onset   Cancer Mother        Breast   Hypertension Father    Heart disease Father    Heart attack Father    Cancer Maternal Aunt        Breast   Hyperlipidemia Maternal Grandmother    Parkinson's disease Paternal Grandfather     Social History   Tobacco Use   Smoking status: Never   Smokeless tobacco: Never  Substance Use Topics   Alcohol use: No    Alcohol/week: 0.0 standard drinks     Current Outpatient Medications:  amphetamine-dextroamphetamine (ADDERALL) 10 MG tablet, Take 1 tablet (10 mg total) by mouth 2 (two) times daily., Disp: 60 tablet, Rfl: 0   benzonatate (TESSALON) 100 MG capsule, Take 2 capsules (200 mg total) by mouth 3 (three) times daily as needed for cough., Disp: 20 capsule, Rfl: 0   Cholecalciferol (VITAMIN D3) 50 MCG (2000 UT) capsule, Take 1 capsule (2,000 Units total) by mouth daily., Disp: 100 capsule, Rfl: 0   Cyanocobalamin (B-12) 1000 MCG SUBL, Place 1 tablet under the tongue 3 (three) times a week., Disp: 100 tablet, Rfl: 1   Daridorexant HCl (QUVIVIQ) 50 MG TABS, Take 1 tablet by mouth at bedtime. For sleep, Disp: 30 tablet, Rfl: 5   divalproex (DEPAKOTE) 250 MG DR tablet, Take 1 tablet (250 mg total) by mouth 2 (two) times daily., Disp: 180 tablet, Rfl: 0   escitalopram (LEXAPRO) 10 MG tablet, Take 1  tablet (10 mg total) by mouth daily., Disp: 90 tablet, Rfl: 1   Melatonin 5 MG CHEW, Chew 1 tablet by mouth daily., Disp: , Rfl:    Rimegepant Sulfate (NURTEC) 75 MG TBDP, Take 1 tablet by mouth every other day. For prevention of migraine, Disp: 16 tablet, Rfl: 2  Allergies  Allergen Reactions   Penicillins Rash    I personally reviewed active problem list, medication list, allergies, family history, social history with the patient/caregiver today.   ROS  Constitutional: Negative for fever or weight change.  Respiratory: Negative for cough and shortness of breath.   Cardiovascular: Negative for chest pain or palpitations.  Gastrointestinal: Negative for abdominal pain, no bowel changes.  Musculoskeletal: Negative for gait problem or joint swelling.  Skin: Negative for rash.  Neurological: Negative for dizziness , positive for intermittent headache.  No other specific complaints in a complete review of systems (except as listed in HPI above).   Objective  Vitals:   04/14/21 1322  BP: 126/74  Pulse: 92  Resp: 16  Temp: (!) 97.5 F (36.4 C)  SpO2: 97%  Weight: 237 lb (107.5 kg)  Height: 5\' 10"  (1.778 m)    Body mass index is 34.01 kg/m.  Physical Exam  Constitutional: Patient appears well-developed and well-nourished. Obese  No distress.  HEENT: head atraumatic, normocephalic, pupils equal and reactive to light, neck supple Cardiovascular: Normal rate, regular rhythm and normal heart sounds.  No murmur heard. No BLE edema. Pulmonary/Chest: Effort normal and breath sounds normal. No respiratory distress. Abdominal: Soft.  There is no tenderness. Psychiatric: Patient has a normal mood and affect. behavior is normal. Judgment and thought content normal.   Recent Results (from the past 2160 hour(s))  Novel Coronavirus, NAA ([facility])     Status: None   Collection Time: 04/02/21 12:00 AM   Specimen: Nasopharyngeal(NP) swabs in vial transport medium   Nasopharynge  Previous   Result Value Ref Range   SARS-CoV-2, NAA Not Detected Not Detected    Comment: This nucleic acid amplification test was developed and its performance characteristics determined by 14/09/22. Nucleic acid amplification tests include RT-PCR and TMA. This test has not been FDA cleared or approved. This test has been authorized by FDA under an Emergency Use Authorization (EUA). This test is only authorized for the duration of time the declaration that circumstances exist justifying the authorization of the emergency use of in vitro diagnostic tests for detection of SARS-CoV-2 virus and/or diagnosis of COVID-19 infection under section 564(b)(1) of the Act, 21 U.S.C. [employer]) (1), unless the authorization is terminated or revoked sooner. When  diagnostic testing is negative, the possibility of a false negative result should be considered in the context of a patient's recent exposures and the presence of clinical signs and symptoms consistent with COVID-19. An individual without symptoms of COVID-19 and who is not shedding SARS-CoV-2 virus wo uld expect to have a negative (not detected) result in this assay.   POCT Influenza A/B     Status: None   Collection Time: 04/02/21 10:46 AM  Result Value Ref Range   Influenza A, POC Negative Negative   Influenza B, POC Negative Negative  POCT rapid strep A     Status: None   Collection Time: 04/02/21 10:46 AM  Result Value Ref Range   Rapid Strep A Screen Negative Negative   PHQ2/9: Depression screen [facility] 2/9 04/14/2021 04/02/2021 01/11/2021 10/09/2020 08/11/2020  Decreased Interest 0 0 0 1 0  Down, Depressed, Hopeless 0 0 0 0 0  PHQ - 2 Score 0 0 0 1 0  Altered sleeping 0 0 1 2 2   Tired, decreased energy 0 0 1 0 -  Change in appetite 0 0 0 0 0  Feeling bad or failure about yourself  0 0 0 1 1  Trouble concentrating 0 0 1 1 1   Moving slowly or fidgety/restless 0 0 0 0 0  Suicidal thoughts 0 0 0 0 0  PHQ-9 Score 0 0 3 5 4    Difficult doing work/chores - Not difficult at all Somewhat difficult - -  Some recent data might be hidden    phq 9 is negative   Fall Risk: Fall Risk  04/14/2021 04/02/2021 01/11/2021 10/09/2020 08/11/2020  Falls in the past year? 0 0 0 0 0  Number falls in past yr: 0 0 - 0 0  Injury with Fall? 0 0 - 0 0  Risk for fall due to : No Fall Risks - - - -  Follow up Falls prevention discussed Falls evaluation completed - - -      Functional Status Survey: Is the patient deaf or have difficulty hearing?: No Does the patient have difficulty seeing, even when wearing glasses/contacts?: Yes Does the patient have difficulty concentrating, remembering, or making decisions?: No Does the patient have difficulty walking or climbing stairs?: No Does the patient have difficulty dressing or bathing?: No Does the patient have difficulty doing errands alone such as visiting a doctor's office or shopping?: No    Assessment & Plan  1. Major depression in remission (HCC)  - divalproex (DEPAKOTE) 250 MG DR tablet; Take 1 tablet (250 mg total) by mouth every evening.  Dispense: 90 tablet; Refill: 1 - amphetamine-dextroamphetamine (ADDERALL) 10 MG tablet; Take 1 tablet (10 mg total) by mouth 2 (two) times daily.  Dispense: 180 tablet; Refill: 0  2. Migraine without aura and without status migrainosus, not intractable  - divalproex (DEPAKOTE) 250 MG DR tablet; Take 1 tablet (250 mg total) by mouth every evening.  Dispense: 90 tablet; Refill: 1 - Rimegepant Sulfate (NURTEC) 75 MG TBDP; Take 1 tablet by mouth every other day. For prevention of migraine  Dispense: 16 tablet; Refill: 2  3. B12 deficiency   4. Class 2 obesity due to excess calories without serious comorbidity with body mass index (BMI) of 35.0 to 35.9 in adult   5. Vitamin D deficiency   6. GAD (generalized anxiety disorder)

## 2021-04-14 ENCOUNTER — Ambulatory Visit: Payer: BC Managed Care – PPO | Admitting: Family Medicine

## 2021-04-14 ENCOUNTER — Encounter: Payer: Self-pay | Admitting: Family Medicine

## 2021-04-14 VITALS — BP 126/74 | HR 92 | Temp 97.5°F | Resp 16 | Ht 70.0 in | Wt 237.0 lb

## 2021-04-14 DIAGNOSIS — Z6835 Body mass index (BMI) 35.0-35.9, adult: Secondary | ICD-10-CM

## 2021-04-14 DIAGNOSIS — F325 Major depressive disorder, single episode, in full remission: Secondary | ICD-10-CM | POA: Diagnosis not present

## 2021-04-14 DIAGNOSIS — E6609 Other obesity due to excess calories: Secondary | ICD-10-CM | POA: Diagnosis not present

## 2021-04-14 DIAGNOSIS — E559 Vitamin D deficiency, unspecified: Secondary | ICD-10-CM

## 2021-04-14 DIAGNOSIS — E538 Deficiency of other specified B group vitamins: Secondary | ICD-10-CM | POA: Diagnosis not present

## 2021-04-14 DIAGNOSIS — G43009 Migraine without aura, not intractable, without status migrainosus: Secondary | ICD-10-CM

## 2021-04-14 DIAGNOSIS — F411 Generalized anxiety disorder: Secondary | ICD-10-CM | POA: Insufficient documentation

## 2021-04-14 MED ORDER — DIVALPROEX SODIUM 250 MG PO DR TAB: 250.0000 mg | DELAYED_RELEASE_TABLET | Freq: Every evening | ORAL | 1 refills | Status: DC

## 2021-04-14 MED ORDER — NURTEC 75 MG PO TBDP: 1.0000 | ORAL_TABLET | ORAL | 2 refills | Status: DC

## 2021-04-14 MED ORDER — AMPHETAMINE-DEXTROAMPHETAMINE 10 MG PO TABS: 10.0000 mg | ORAL_TABLET | Freq: Two times a day (BID) | ORAL | 0 refills | Status: DC

## 2021-04-27 ENCOUNTER — Other Ambulatory Visit: Payer: Self-pay | Admitting: Family Medicine

## 2021-04-27 DIAGNOSIS — F5104 Psychophysiologic insomnia: Secondary | ICD-10-CM

## 2021-04-29 NOTE — Telephone Encounter (Signed)
[facility] is currently reprocessing the claim. Patient is aware.

## 2021-05-26 ENCOUNTER — Telehealth: Payer: Self-pay

## 2021-05-26 ENCOUNTER — Other Ambulatory Visit: Payer: Self-pay

## 2021-05-26 ENCOUNTER — Other Ambulatory Visit: Payer: Self-pay | Admitting: Family Medicine

## 2021-05-26 DIAGNOSIS — F5104 Psychophysiologic insomnia: Secondary | ICD-10-CM

## 2021-05-26 DIAGNOSIS — F325 Major depressive disorder, single episode, in full remission: Secondary | ICD-10-CM

## 2021-05-26 MED ORDER — AMPHETAMINE-DEXTROAMPHETAMINE 10 MG PO TABS: 10.0000 mg | ORAL_TABLET | Freq: Two times a day (BID) | ORAL | 0 refills | Status: DC

## 2021-05-26 MED ORDER — QUVIVIQ 50 MG PO TABS: 1.0000 | ORAL_TABLET | Freq: Every evening | ORAL | 2 refills | Status: DC

## 2021-05-26 NOTE — Telephone Encounter (Signed)
Pt  informed to do so and given our fax #

## 2021-05-26 NOTE — Telephone Encounter (Signed)
Pt needs refill on Adderell to be sent to [facility] and he also needs Quviviq to be sent to [facility]. Please advise

## 2021-05-26 NOTE — Telephone Encounter (Signed)
Pt calling back asking if the codes from his physical for his labs have been updated  and sent back to [facility]? He says [facility] has nothing on file and he is getting billed. Please call pt and update.

## 2021-06-29 ENCOUNTER — Other Ambulatory Visit: Payer: Self-pay

## 2021-06-29 ENCOUNTER — Telehealth: Payer: Self-pay

## 2021-06-29 DIAGNOSIS — F5104 Psychophysiologic insomnia: Secondary | ICD-10-CM

## 2021-06-29 MED ORDER — QUVIVIQ 50 MG PO TABS: 1.0000 | ORAL_TABLET | Freq: Every evening | ORAL | 2 refills | Status: DC

## 2021-06-29 NOTE — Telephone Encounter (Signed)
Pt needs refill on Quviviq to be sent to [facility] 

## 2021-07-16 NOTE — Progress Notes (Signed)
Name: Gregory Matthews   MRN: [id number]    DOB: [birth year]   Date:07/19/2021 ? ?     Progress Note ? ?Subjective ? ?Chief Complaint ? ?Follow Up ? ?HPI ? ?MDD/GAD:diagnosed 05/2019  he is getting marital counseling occasionally now that they are doing much better, but he still has solo sessions also but down to every 2 months.His Phq 9 has been normal .He is still taking Depakote and also Lexapro daily, plus Adderal for energy and focus currently having to take it twice daly so he does not crash at the end of the day.   ? ?Insomnia: he has tried Hydroxizine , Trazodone and Temazepam but either had side effects of inefective currently on Quviviq and has been able to fall and stay asleep , no side effects . Continue medication  ?  ?Migraine:We have tried Topamax and Imitrex. He has stopped both medication secondary to side effects. Topamax he stopped because of change in taste and Imitrex, because flushing sensation and chest tightness. Episodes of migraine are described as a dull  headache, followed by nausea and the pain intensifies and becomes throbbing or  pounding sensation in different parts of his head.  It is associated with photophobia, phonophobia and movement - such as walking. He stopped Elavil. His dull headaches were about once a week without medication and migraine headaches about a couple of times a month. He states since he has been wearing rx glasses he is also feeling better. No longer taking Nurtec  ?  ?Dyslipidemia:  ?  ?The 10-year ASCVD risk score (Arnett DK, et al., 2019) is: 1.7% ?  Values used to calculate the score: ?    Age: 41 years ?    Sex: Male ?    Is Non-Hispanic African American: No ?    Diabetic: No ?    Tobacco smoker: No ?    Systolic Blood Pressure: 124 mmHg ?    Is BP treated: No ?    HDL Cholesterol: 40 mg/dL ?    Total Cholesterol: 202 mg/dL  ?  ?B12 deficiency: reviewed last labs, need to take otc B12 a few times a week. Last Vitamin B12 was 326  ?  ?Vitamin D deficiency:  advised daily supplementation . We will recheck labs  ?  ?Obesity: he is doing a weight loss challenge with his wife with the assistance of a personal trainer , they are now also following a keto diet, he lost 4 lbs since last visit with us in Dec. His initial weight was 245 lbs and today at home today weight is 234 lbs in our office, he states at home his weight is down to 228 lbs  He has noticed he is losing inches ? ?Fertility questions: married, same partner past 11 years, his wife got pregnant once but had a miscarriage in 7022. She is 42 yo and he is 41 yo . He would like to evaluated for fertility , she is also seeing her gyn. Explained sometimes fertility clinics test both parents but we will place a referral to Urologist today ? ?Patient Active Problem List  ? Diagnosis Date Noted  ? Major depression in remission (HCC) 04/14/2021  ? B12 deficiency 04/14/2021  ? Vitamin D deficiency 04/14/2021  ? GAD (generalized anxiety disorder) 04/14/2021  ? Headache, migraine 06/08/2015  ? Obesity (BMI 30.0-34.9) 06/08/2015  ? Allergic rhinitis, seasonal 07/26/2013  ? ? ?Past Surgical History:  ?Procedure Laterality Date  ? HAIR TRANSPLANT    ?  RHINOPLASTY    ? TONSILLECTOMY    ? ? ?Family History  ?Problem Relation Age of Onset  ? Cancer Mother   ?     Breast  ? Hypertension Father   ? Heart disease Father   ? Heart attack Father   ? Cancer Maternal Aunt   ?     Breast  ? Hyperlipidemia Maternal Grandmother   ? Parkinson's disease Paternal Grandfather   ? ? ?Social History  ? ?Tobacco Use  ? Smoking status: Never  ? Smokeless tobacco: Never  ?Substance Use Topics  ? Alcohol use: No  ?  Alcohol/week: 0.0 standard drinks  ? ? ? ?Current Outpatient Medications:  ?  amphetamine-dextroamphetamine (ADDERALL) 10 MG tablet, Take 1 tablet (10 mg total) by mouth 2 (two) times daily., Disp: 60 tablet, Rfl: 0 ?  Daridorexant HCl (QUVIVIQ) 50 MG TABS, Take 1 tablet by mouth at bedtime. For sleep, Disp: 30 tablet, Rfl: 2 ?  divalproex  (DEPAKOTE) 250 MG DR tablet, Take 1 tablet (250 mg total) by mouth every evening., Disp: 90 tablet, Rfl: 1 ?  escitalopram (LEXAPRO) 10 MG tablet, Take 1 tablet (10 mg total) by mouth daily., Disp: 90 tablet, Rfl: 1 ?  Rimegepant Sulfate (NURTEC) 75 MG TBDP, Take 1 tablet by mouth every other day. For prevention of migraine (Patient not taking: Reported on 07/19/2021), Disp: 16 tablet, Rfl: 2 ? ?Allergies  ?Allergen Reactions  ? Penicillins Rash  ? ? ?I personally reviewed active problem list, medication list, allergies, family history, social history, health maintenance with the patient/caregiver today. ? ? ?ROS ? ?Constitutional: Negative for fever or weight change.  ?Respiratory: Negative for cough and shortness of breath.   ?Cardiovascular: Negative for chest pain or palpitations.  ?Gastrointestinal: Negative for abdominal pain, no bowel changes.  ?Musculoskeletal: Negative for gait problem or joint swelling.  ?Skin: Negative for rash.  ?Neurological: Negative for dizziness or headache.  ?No other specific complaints in a complete review of systems (except as listed in HPI above).  ? ?Objective ? ?Vitals:  ? 07/19/21 1544  ?BP: 124/70  ?Pulse: 91  ?Resp: 16  ?SpO2: 98%  ?Weight: 234 lb (106.1 kg)  ?Height: 5\' 10"  (1.778 m)  ? ? ?Body mass index is 33.58 kg/m?. ? ?Physical Exam ? ?Constitutional: Patient appears well-developed and well-nourished. Obese  No distress.  ?HEENT: head atraumatic, normocephalic, pupils equal and reactive to light, neck supple ?Cardiovascular: Normal rate, regular rhythm and normal heart sounds.  No murmur heard. No BLE edema. ?Pulmonary/Chest: Effort normal and breath sounds normal. No respiratory distress. ?Abdominal: Soft.  There is no tenderness. ?Psychiatric: Patient has a normal mood and affect. behavior is normal. Judgment and thought content normal.  ? ?PHQ2/9: ? ?  07/19/2021  ?  3:44 PM 04/14/2021  ?  1:22 PM 04/02/2021  ? 10:04 AM 01/11/2021  ?  3:41 PM 10/09/2020  ?  9:26 AM   ?Depression screen PHQ 2/9  ?Decreased Interest 0 0 0 0 1  ?Down, Depressed, Hopeless 0 0 0 0 0  ?PHQ - 2 Score 0 0 0 0 1  ?Altered sleeping 1 1 0 1 2  ?Tired, decreased energy 0 1 0 1 0  ?Change in appetite 0 0 0 0 0  ?Feeling bad or failure about yourself  0 0 0 0 1  ?Trouble concentrating 0 0 0 1 1  ?Moving slowly or fidgety/restless 0 0 0 0 0  ?Suicidal thoughts 0 0 0 0 0  ?PHQ-9 Score  1 2 0 3 5  ?Difficult doing work/chores   Not difficult at all Somewhat difficult   ?  ?phq 9 is negative ? ? ?Fall Risk: ? ?  07/19/2021  ?  3:44 PM 04/14/2021  ?  1:22 PM 04/02/2021  ? 10:04 AM 01/11/2021  ?  3:39 PM 10/09/2020  ?  9:26 AM  ?Fall Risk   ?Falls in the past year? 0 0 0 0 0  ?Number falls in past yr: 0 0 0  0  ?Injury with Fall? 0 0 0  0  ?Risk for fall due to : No Fall Risks No Fall Risks     ?Follow up Falls prevention discussed Falls prevention discussed Falls evaluation completed    ? ? ? ? ?Functional Status Survey: ?Is the patient deaf or have difficulty hearing?: No ?Does the patient have difficulty seeing, even when wearing glasses/contacts?: No ?Does the patient have difficulty concentrating, remembering, or making decisions?: No ?Does the patient have difficulty walking or climbing stairs?: No ?Does the patient have difficulty dressing or bathing?: No ?Does the patient have difficulty doing errands alone such as visiting a doctor's office or shopping?: No ? ? ? ?Assessment & Plan ? ?1. Major depression in remission [facility]) ? ?- escitalopram (LEXAPRO) 10 MG tablet; Take 1 tablet (10 mg total) by mouth daily.  Dispense: 90 tablet; Refill: 1 ?- divalproex (DEPAKOTE) 250 MG DR tablet; Take 1 tablet (250 mg total) by mouth every evening.  Dispense: 90 tablet; Refill: 1 ?- amphetamine-dextroamphetamine (ADDERALL) 10 MG tablet; Take 1 tablet (10 mg total) by mouth 2 (two) times daily.  Dispense: 180 tablet; Refill: 0 ? ?2. Migraine without aura and without status migrainosus, not intractable ? ?- divalproex  (DEPAKOTE) 250 MG DR tablet; Take 1 tablet (250 mg total) by mouth every evening.  Dispense: 90 tablet; Refill: 1 ? ?3. Vitamin D deficiency ? ?- VITAMIN D 25 Hydroxy (Vit-D Deficiency, Fractures) ? ?4. Class 2 obesit

## 2021-07-19 ENCOUNTER — Ambulatory Visit (INDEPENDENT_AMBULATORY_CARE_PROVIDER_SITE_OTHER): Payer: BC Managed Care – PPO | Admitting: Family Medicine

## 2021-07-19 ENCOUNTER — Encounter: Payer: Self-pay | Admitting: Family Medicine

## 2021-07-19 VITALS — BP 124/70 | HR 91 | Resp 16 | Ht 70.0 in | Wt 234.0 lb

## 2021-07-19 DIAGNOSIS — E538 Deficiency of other specified B group vitamins: Secondary | ICD-10-CM

## 2021-07-19 DIAGNOSIS — Z3141 Encounter for fertility testing: Secondary | ICD-10-CM

## 2021-07-19 DIAGNOSIS — R519 Headache, unspecified: Secondary | ICD-10-CM

## 2021-07-19 DIAGNOSIS — E785 Hyperlipidemia, unspecified: Secondary | ICD-10-CM

## 2021-07-19 DIAGNOSIS — E6609 Other obesity due to excess calories: Secondary | ICD-10-CM | POA: Diagnosis not present

## 2021-07-19 DIAGNOSIS — G43009 Migraine without aura, not intractable, without status migrainosus: Secondary | ICD-10-CM | POA: Diagnosis not present

## 2021-07-19 DIAGNOSIS — Z79899 Other long term (current) drug therapy: Secondary | ICD-10-CM

## 2021-07-19 DIAGNOSIS — Z6835 Body mass index (BMI) 35.0-35.9, adult: Secondary | ICD-10-CM

## 2021-07-19 DIAGNOSIS — F411 Generalized anxiety disorder: Secondary | ICD-10-CM

## 2021-07-19 DIAGNOSIS — F325 Major depressive disorder, single episode, in full remission: Secondary | ICD-10-CM

## 2021-07-19 DIAGNOSIS — E559 Vitamin D deficiency, unspecified: Secondary | ICD-10-CM | POA: Diagnosis not present

## 2021-07-19 MED ORDER — ESCITALOPRAM OXALATE 10 MG PO TABS: 10.0000 mg | ORAL_TABLET | Freq: Every day | ORAL | 1 refills | Status: DC

## 2021-07-19 MED ORDER — AMPHETAMINE-DEXTROAMPHETAMINE 10 MG PO TABS: 10.0000 mg | ORAL_TABLET | Freq: Two times a day (BID) | ORAL | 0 refills | Status: DC

## 2021-07-19 MED ORDER — DIVALPROEX SODIUM 250 MG PO DR TAB: 250.0000 mg | DELAYED_RELEASE_TABLET | Freq: Every evening | ORAL | 1 refills | Status: DC

## 2021-07-28 ENCOUNTER — Telehealth: Payer: Self-pay

## 2021-07-28 ENCOUNTER — Other Ambulatory Visit: Payer: Self-pay

## 2021-07-28 DIAGNOSIS — F5104 Psychophysiologic insomnia: Secondary | ICD-10-CM

## 2021-07-28 MED ORDER — QUVIVIQ 50 MG PO TABS: 1.0000 | ORAL_TABLET | Freq: Every evening | ORAL | 2 refills | Status: DC

## 2021-07-28 NOTE — Telephone Encounter (Signed)
Pt needs refill on Quviviq to be sent to [facility].  ?

## 2021-10-11 NOTE — Progress Notes (Unsigned)
Name: Gregory Matthews   MRN: [id number]    DOB: December 19, 1980   Date:10/12/2021       Progress Note  Subjective  Chief Complaint  Follow Up  HPI  MDD/GAD:diagnosed 05/2019  he is getting marital counseling occasionally now that they are doing much better, but he still has solo sessions also but down to every 2 months.His Phq 9 has been normal .He is still taking Depakote and also Lexapro daily, plus Adderal for energy and focus currently having to take it twice daly so he does not crash at the end of the day.  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 He also denies any radiation. He states symptoms resolved later on when he eructated . There is a positive family history of heart disease, father in his late 68's, paternal uncle died from complications of MI in his 34's, grandfather had MI in his 34's   Insomnia: he has tried Hydroxizine , Trazodone and Temazepam but either had side effects of inefective currently on Quviviq and has been able to fall and stay asleep , no side effects . Continue medication but we will resume hydroxizine for anxiety only - worse at night, discussed love and kindness meditation also   Low back pain: he was reaching for something while on a ladder a couple of weeks ago and developed acute low back spasm, it is still painful and worse with movement no radiculitis. Sending skelaxin and lidoderm patch to pharmacy    Migraine:We have tried Topamax and Imitrex. He has stopped both medication secondary to side effects. Topamax he stopped because of change in taste and  Imitrex, because flushing sensation and chest tightness. Episodes of migraine are described as a dull  headache, followed by nausea and the pain intensifies and becomes throbbing or  pounding sensation in different parts of his head.  It is associated with photophobia, phonophobia and movement - such as walking. He stopped Elavil. His dull headaches were about once a week without medication and migraine headaches about a couple of times a month. He states since he has been wearing rx glasses he is also feeling better. No longer taking Nurtec.    Dyslipidemia: advised him to recheck labs today    The 10-year ASCVD risk score (Arnett DK, et al., 2019) is: 1.7%   Values used to calculate the score:     Age: 41 years     Sex: Male     Is Non-Hispanic African American: No     Diabetic: No     Tobacco smoker: No     Systolic Blood Pressure: 124 mmHg     Is BP treated: No     HDL Cholesterol: 40 mg/dL     Total Cholesterol: 202 mg/dL    U76 deficiency: reviewed last labs, need to take otc B12 a few times a week. Last Vitamin B12 was 326 , we will recheck labs today    Vitamin D deficiency: advised daily supplementation . We will recheck labs today    Obesity: he is doing a weight loss challenge  with his wife with the assistance of a personal trainer , he was also on keto but he has not been as compliant with his diet over the past few weeks.Marland KitchenHis initial weight was 245 lbs but since Dec 22  it has been stable in the mid 230's.  Fertility questions: married, same partner past 12 years, his wife got pregnant once but had a miscarriage in 34. She is 42 yo and he is 41 yo.  Patient Active Problem List   Diagnosis Date Noted   Major depression in remission (HCC) 04/14/2021   B12 deficiency 04/14/2021   Vitamin D deficiency 04/14/2021   GAD (generalized anxiety disorder) 04/14/2021   Headache, migraine 06/08/2015   Obesity (BMI 30.0-34.9) 06/08/2015   Allergic rhinitis, seasonal 07/26/2013     Past Surgical History:  Procedure Laterality Date   HAIR TRANSPLANT     RHINOPLASTY     TONSILLECTOMY      Family History  Problem Relation Age of Onset   Cancer Mother        Breast   Hypertension Father    Heart disease Father    Heart attack Father    Cancer Maternal Aunt        Breast   Hyperlipidemia Maternal Grandmother    Parkinson's disease Paternal Grandfather     Social History   Tobacco Use   Smoking status: Never   Smokeless tobacco: Never  Substance Use Topics   Alcohol use: No    Alcohol/week: 0.0 standard drinks of alcohol     Current Outpatient Medications:    amphetamine-dextroamphetamine (ADDERALL) 10 MG tablet, Take 1 tablet (10 mg total) by mouth 2 (two) times daily., Disp: 180 tablet, Rfl: 0   Daridorexant HCl (QUVIVIQ) 50 MG TABS, Take 1 tablet by mouth at bedtime. For sleep, Disp: 30 tablet, Rfl: 2   divalproex (DEPAKOTE) 250 MG DR tablet, Take 1 tablet (250 mg total) by mouth every evening., Disp: 90 tablet, Rfl: 1   escitalopram (LEXAPRO) 10 MG tablet, Take 1 tablet (10 mg total) by mouth daily., Disp: 90 tablet, Rfl: 1   Rimegepant Sulfate (NURTEC) 75 MG TBDP, Take 1 tablet by mouth every other day. For prevention of migraine, Disp: 16 tablet, Rfl: 2  Allergies  Allergen Reactions   Penicillins Rash    I personally reviewed active problem list, medication list, allergies, family history, social history, health maintenance with the patient/caregiver today.   ROS  Constitutional: Negative for fever or weight change.  Respiratory: Negative for cough and shortness of breath.   Cardiovascular: Negative for chest pain or palpitations.  Gastrointestinal: Negative for abdominal pain, no bowel changes.  Musculoskeletal: Negative for gait problem or joint swelling.  Skin: Negative for rash.  Neurological: Negative for dizziness or headache.  No other specific complaints in a complete review of systems (except as listed in HPI above).    Objective  Vitals:   10/12/21 1517  BP: 132/76  Pulse: 89  Resp: 16  SpO2: 98%  Weight: 235 lb (106.6 kg)  Height: 5\' 10"  (1.778 m)    Body mass index is 33.72 kg/m.  Physical Exam  Constitutional: Patient appears well-developed and well-nourished. Obese  No distress.  HEENT: head atraumatic, normocephalic, pupils equal and reactive to light, neck supple Cardiovascular: Normal rate, regular rhythm and normal heart sounds.  No murmur heard. No BLE edema. Pulmonary/Chest: Effort normal and breath sounds normal. No respiratory distress. Abdominal: Soft.  There is no tenderness. Psychiatric: Patient has a normal mood  and affect. behavior is normal. Judgment and thought content normal.   PHQ2/9:    10/12/2021    3:16 PM 07/19/2021    3:44 PM 04/14/2021    1:22 PM 04/02/2021   10:04 AM 01/11/2021    3:41 PM  Depression screen PHQ 2/9  Decreased Interest 0 0 0 0 0  Down, Depressed, Hopeless 1 0 0 0 0  PHQ - 2 Score 1 0 0 0 0  Altered sleeping 3 1 1  0 1  Tired, decreased energy 3 0 1 0 1  Change in appetite 0 0 0 0 0  Feeling bad or failure about yourself  1 0 0 0 0  Trouble concentrating 1 0 0 0 1  Moving slowly or fidgety/restless 0 0 0 0 0  Suicidal thoughts 0 0 0 0 0  PHQ-9 Score 9 1 2  0 3  Difficult doing work/chores    Not difficult at all Somewhat difficult    phq 9 is positive   Fall Risk:    10/12/2021    3:16 PM 07/19/2021    3:44 PM 04/14/2021    1:22 PM 04/02/2021   10:04 AM 01/11/2021    3:39 PM  Fall Risk   Falls in the past year? 0 0 0 0 0  Number falls in past yr: 0 0 0 0   Injury with Fall? 0 0 0 0   Risk for fall due to : No Fall Risks No Fall Risks No Fall Risks    Follow up Falls prevention discussed Falls prevention discussed Falls prevention discussed Falls evaluation completed       Functional Status Survey: Is the patient deaf or have difficulty hearing?: No Does the patient have difficulty seeing, even when wearing glasses/contacts?:  No Does the patient have difficulty concentrating, remembering, or making decisions?: No Does the patient have difficulty walking or climbing stairs?: No Does the patient have difficulty dressing or bathing?: No Does the patient have difficulty doing errands alone such as visiting a doctor's office or shopping?: No    Assessment & Plan  1. Atypical chest pain  - EKG 12-Lead - CRP High sensitivity  2. Major depression in remission (HCC)  - amphetamine-dextroamphetamine (ADDERALL) 10 MG tablet; Take 1 tablet (10 mg total) by mouth 2 (two) times daily.  Dispense: 180 tablet; Refill: 0  3. GAD (generalized anxiety disorder)  - hydrOXYzine (ATARAX) 10 MG tablet; Take 1 tablet (10 mg total) by mouth daily as needed for anxiety.  Dispense: 30 tablet; Refill: 0  4. Psychophysiological insomnia  - Daridorexant HCl (QUVIVIQ) 50 MG TABS; Take 1 tablet by mouth at bedtime. For sleep  Dispense: 30 tablet; Refill: 2  5. Spasm of muscle of lower back  - metaxalone (SKELAXIN) 800 MG tablet; Take 1 tablet (800 mg total) by mouth 3 (three) times daily as needed for muscle spasms.  Dispense: 90 tablet; Refill: 0 - lidocaine (LIDODERM) 5 %; Place 1 patch onto the skin daily. Remove & Discard patch within 12 hours or as directed by MD  Dispense: 30 patch; Refill: 0

## 2021-10-12 ENCOUNTER — Ambulatory Visit (INDEPENDENT_AMBULATORY_CARE_PROVIDER_SITE_OTHER): Payer: BC Managed Care – PPO | Admitting: Family Medicine

## 2021-10-12 ENCOUNTER — Encounter: Payer: Self-pay | Admitting: Family Medicine

## 2021-10-12 VITALS — BP 132/76 | HR 89 | Resp 16 | Ht 70.0 in | Wt 235.0 lb

## 2021-10-12 DIAGNOSIS — F325 Major depressive disorder, single episode, in full remission: Secondary | ICD-10-CM | POA: Diagnosis not present

## 2021-10-12 DIAGNOSIS — F5104 Psychophysiologic insomnia: Secondary | ICD-10-CM

## 2021-10-12 DIAGNOSIS — R0789 Other chest pain: Secondary | ICD-10-CM

## 2021-10-12 DIAGNOSIS — M62830 Muscle spasm of back: Secondary | ICD-10-CM

## 2021-10-12 DIAGNOSIS — F411 Generalized anxiety disorder: Secondary | ICD-10-CM | POA: Diagnosis not present

## 2021-10-12 MED ORDER — LIDOCAINE 5 % EX PTCH: 1.0000 | MEDICATED_PATCH | CUTANEOUS | 0 refills | Status: DC

## 2021-10-12 MED ORDER — AMPHETAMINE-DEXTROAMPHETAMINE 10 MG PO TABS: 10.0000 mg | ORAL_TABLET | Freq: Two times a day (BID) | ORAL | 0 refills | Status: DC

## 2021-10-12 MED ORDER — HYDROXYZINE HCL 10 MG PO TABS: 10.0000 mg | ORAL_TABLET | Freq: Every day | ORAL | 0 refills | Status: DC | PRN

## 2021-10-12 MED ORDER — METAXALONE 800 MG PO TABS: 800.0000 mg | ORAL_TABLET | Freq: Three times a day (TID) | ORAL | 0 refills | Status: DC | PRN

## 2021-10-12 MED ORDER — QUVIVIQ 50 MG PO TABS: 1.0000 | ORAL_TABLET | Freq: Every evening | ORAL | 2 refills | Status: DC

## 2021-10-12 NOTE — Patient Instructions (Signed)
Love and kindness meditation  May you be healthy, may you be happy , may you be safe

## 2021-10-13 ENCOUNTER — Ambulatory Visit: Payer: BC Managed Care – PPO | Admitting: Family Medicine

## 2021-10-13 LAB — VITAMIN D 25 HYDROXY (VIT D DEFICIENCY, FRACTURES): Vit D, 25-Hydroxy: 33 ng/mL (ref 30–100)

## 2021-10-13 LAB — COMPLETE METABOLIC PANEL WITH GFR
AG Ratio: 1.6 (calc) (ref 1.0–2.5)
ALT: 46 U/L (ref 9–46)
AST: 23 U/L (ref 10–40)
Albumin: 4.5 g/dL (ref 3.6–5.1)
Alkaline phosphatase (APISO): 42 U/L (ref 36–130)
BUN: 21 mg/dL (ref 7–25)
CO2: 24 mmol/L (ref 20–32)
Calcium: 9.4 mg/dL (ref 8.6–10.3)
Chloride: 105 mmol/L (ref 98–110)
Creat: 1.06 mg/dL (ref 0.60–1.29)
Globulin: 2.8 g/dL (calc) (ref 1.9–3.7)
Glucose, Bld: 85 mg/dL (ref 65–99)
Potassium: 4.5 mmol/L (ref 3.5–5.3)
Sodium: 139 mmol/L (ref 135–146)
Total Bilirubin: 0.3 mg/dL (ref 0.2–1.2)
Total Protein: 7.3 g/dL (ref 6.1–8.1)
eGFR: 90 mL/min/{1.73_m2} (ref >=60)

## 2021-10-13 LAB — LIPID PANEL
Cholesterol: 211 mg/dL — ABNORMAL HIGH (ref <200)
HDL: 42 mg/dL (ref >=40)
LDL Cholesterol (Calc): 129 mg/dL (calc) — ABNORMAL HIGH
Non-HDL Cholesterol (Calc): 169 mg/dL (calc) — ABNORMAL HIGH (ref <130)
Total CHOL/HDL Ratio: 5 (calc) — ABNORMAL HIGH (ref <5.0)
Triglycerides: 245 mg/dL — ABNORMAL HIGH (ref <150)

## 2021-10-13 LAB — VITAMIN B12: Vitamin B-12: 355 pg/mL (ref 200–1100)

## 2021-10-13 LAB — HIGH SENSITIVITY CRP: hs-CRP: 2.5 mg/L

## 2021-10-18 ENCOUNTER — Encounter: Payer: BC Managed Care – PPO | Admitting: Family Medicine

## 2021-11-23 ENCOUNTER — Telehealth: Payer: Self-pay | Admitting: Family Medicine

## 2021-11-23 ENCOUNTER — Other Ambulatory Visit: Payer: Self-pay | Admitting: Family Medicine

## 2021-11-23 DIAGNOSIS — F325 Major depressive disorder, single episode, in full remission: Secondary | ICD-10-CM

## 2021-11-23 NOTE — Telephone Encounter (Signed)
Medication Refill - Medication: amphetamine-dextroamphetamine (ADDERALL) 10 MG tablet  Has the patient contacted their pharmacy? No.   Preferred Pharmacy (with phone number or street name):  [id number] [address] [facility], [place] - [address]  [address] [place], [place] [address]  Phone:  [phone]  Fax:  [phone]  Has the patient been seen for an appointment in the last year OR does the patient have an upcoming appointment? Yes.

## 2021-12-03 ENCOUNTER — Other Ambulatory Visit: Payer: Self-pay | Admitting: Family Medicine

## 2021-12-03 DIAGNOSIS — F5104 Psychophysiologic insomnia: Secondary | ICD-10-CM

## 2021-12-03 NOTE — Telephone Encounter (Signed)
Last seen 6/20

## 2022-01-05 ENCOUNTER — Other Ambulatory Visit: Payer: Self-pay

## 2022-01-05 ENCOUNTER — Telehealth: Payer: Self-pay | Admitting: Family Medicine

## 2022-01-05 DIAGNOSIS — F5104 Psychophysiologic insomnia: Secondary | ICD-10-CM

## 2022-01-05 MED ORDER — QUVIVIQ 50 MG PO TABS: 1.0000 | ORAL_TABLET | Freq: Every evening | ORAL | 0 refills | Status: DC

## 2022-01-05 NOTE — Telephone Encounter (Signed)
Pt needs refill on Quviviq to be sent to [facility]

## 2022-01-10 NOTE — Progress Notes (Unsigned)
Name: Gregory Matthews   MRN: [id number]    DOB: May 25, 1980   Date:01/11/2022       Progress Note  Subjective  Chief Complaint  Follow Up  HPI  MDD/GAD:diagnosed 05/2019  he is getting marital counseling occasionally now that they are doing much better, but he still has solo sessions also but down to every 2 months.His Phq 9 has been normal .He is still taking Depakote and also Lexapro daily, plus Adderal for energy and focus occasionally takes it twice a day. 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 Stable on medication   Insomnia: he has tried Hydroxizine , Trazodone and Temazepam but either had side effects of inefective currently on Quviviq and has been able to fall and stay asleep , no side effects .   Low back pain: currently doing okay , he still has skelaxin at home    Migraine:We have tried Topamax and Imitrex. He has stopped both medication secondary to side effects. Topamax he stopped because of change in taste and Imitrex, because flushing sensation and chest tightness. Episodes of migraine are described as a dull  headache, followed by nausea and the pain intensifies and becomes throbbing or  pounding sensation in different parts of his head.  It is associated with photophobia, phonophobia and movement - such as walking. He stopped Elavil on his own, when he was taking medication episodes were a couple times a month, but is back to twice a week. He would like to hold off for now he will resume regular physical activity to see if it will improve symptoms    Dyslipidemia: reviewed labs with patient    The 10-year ASCVD risk score (Arnett DK, et al., 2019) is: 2%   Values used to calculate the score:     Age: 41 years     Sex: Male     Is Non-Hispanic African American: No     Diabetic: No     Tobacco smoker: No     Systolic Blood Pressure: 485 mmHg     Is BP treated: No     HDL Cholesterol: 42  mg/dL     Total Cholesterol: 211 mg/dL    B12 deficiency: taking SL B12 a few times a week   Vitamin D deficiency: advised daily supplementation .    Obesity: he is doing a weight loss challenge with his wife with the assistance of a personal trainer , he was also on keto but he has not been as compliant with his diet over the past few weeks.Marland KitchenHis initial weight was 245 lbs but since Dec 22  it has been stable in the mid 230's.  Fertility questions: married, same partner past 12 years, his wife got pregnant once but had a miscarriage in 89. She is 42 yo and he is 41 yo. He asked about sperm count , explained urologist don't check for that. Wife needs to see a fertility sub-specialist or OB that can order his test .   Patient Active Problem List   Diagnosis Date Noted   Major depression in remission ([place]) 04/14/2021   B12 deficiency 04/14/2021   Vitamin D deficiency 04/14/2021   GAD (generalized anxiety disorder) 04/14/2021   Headache, migraine 06/08/2015   Obesity (BMI 30.0-34.9) 06/08/2015   Allergic rhinitis, seasonal 07/26/2013    Past Surgical History:  Procedure Laterality Date   HAIR TRANSPLANT  RHINOPLASTY     TONSILLECTOMY      Family History  Problem Relation Age of Onset   Cancer Mother        Breast   Hypertension Father    Heart disease Father    Heart attack Father    Cancer Maternal Aunt        Breast   Hyperlipidemia Maternal Grandmother    Parkinson's disease Paternal Grandfather     Social History   Tobacco Use   Smoking status: Never   Smokeless tobacco: Never  Substance Use Topics   Alcohol use: No    Alcohol/week: 0.0 standard drinks of alcohol     Current Outpatient Medications:    amphetamine-dextroamphetamine (ADDERALL) 10 MG tablet, Take 1 tablet (10 mg total) by mouth 2 (two) times daily., Disp: 180 tablet, Rfl: 0   Daridorexant HCl (QUVIVIQ) 50 MG TABS, Take 1 tablet by mouth at bedtime. For sleep, Disp: 30 tablet, Rfl: 0    divalproex (DEPAKOTE) 250 MG DR tablet, Take 1 tablet (250 mg total) by mouth every evening., Disp: 90 tablet, Rfl: 1   escitalopram (LEXAPRO) 10 MG tablet, Take 1 tablet (10 mg total) by mouth daily., Disp: 90 tablet, Rfl: 1   hydrOXYzine (ATARAX) 10 MG tablet, Take 1 tablet (10 mg total) by mouth daily as needed for anxiety., Disp: 30 tablet, Rfl: 0   lidocaine (LIDODERM) 5 %, Place 1 patch onto the skin daily. Remove & Discard patch within 12 hours or as directed by MD (Patient not taking: Reported on 01/11/2022), Disp: 30 patch, Rfl: 0   metaxalone (SKELAXIN) 800 MG tablet, Take 1 tablet (800 mg total) by mouth 3 (three) times daily as needed for muscle spasms. (Patient not taking: Reported on 01/11/2022), Disp: 90 tablet, Rfl: 0  Allergies  Allergen Reactions   Penicillins Rash    I personally reviewed active problem list, medication list, allergies, family history, social history, health maintenance with the patient/caregiver today.   ROS  Constitutional: Negative for fever or weight change.  Respiratory: Negative for cough and shortness of breath.   Cardiovascular: Negative for chest pain or palpitations.  Gastrointestinal: Negative for abdominal pain, no bowel changes.  Musculoskeletal: Negative for gait problem or joint swelling.  Skin: Negative for rash.  Neurological: Negative for dizziness or headache.  No other specific complaints in a complete review of systems (except as listed in HPI above).   Objective  Vitals:   01/11/22 1523  BP: 128/68  Pulse: 84  Resp: 16  SpO2: 98%  Weight: 242 lb (109.8 kg)  Height: 5\' 10"  (1.778 m)    Body mass index is 34.72 kg/m.  Physical Exam  Constitutional: Patient appears well-developed and well-nourished. Obese  No distress.  HEENT: head atraumatic, normocephalic, pupils equal and reactive to light, neck supple Cardiovascular: Normal rate, regular rhythm and normal heart sounds.  No murmur heard. No BLE  edema. Pulmonary/Chest: Effort normal and breath sounds normal. No respiratory distress. Abdominal: Soft.  There is no tenderness. Psychiatric: Patient has a normal mood and affect. behavior is normal. Judgment and thought content normal.    PHQ2/9:    01/11/2022    3:23 PM 10/12/2021    3:16 PM 07/19/2021    3:44 PM 04/14/2021    1:22 PM 04/02/2021   10:04 AM  Depression screen PHQ 2/9  Decreased Interest 0 0 0 0 0  Down, Depressed, Hopeless 0 1 0 0 0  PHQ - 2 Score 0 1 0 0 0  Altered sleeping 2 3 1 1  0  Tired, decreased energy 1 3 0 1 0  Change in appetite 0 0 0 0 0  Feeling bad or failure about yourself  0 1 0 0 0  Trouble concentrating 0 1 0 0 0  Moving slowly or fidgety/restless 0 0 0 0 0  Suicidal thoughts 0 0 0 0 0  PHQ-9 Score 3 9 1 2  0  Difficult doing work/chores     Not difficult at all    phq 9 is negative    Fall Risk:    01/11/2022    3:23 PM 10/12/2021    3:16 PM 07/19/2021    3:44 PM 04/14/2021    1:22 PM 04/02/2021   10:04 AM  Fall Risk   Falls in the past year? 0 0 0 0 0  Number falls in past yr: 0 0 0 0 0  Injury with Fall? 0 0 0 0 0  Risk for fall due to : No Fall Risks No Fall Risks No Fall Risks No Fall Risks   Follow up Falls prevention discussed Falls prevention discussed Falls prevention discussed Falls prevention discussed Falls evaluation completed      Functional Status Survey: Is the patient deaf or have difficulty hearing?: No Does the patient have difficulty seeing, even when wearing glasses/contacts?: No Does the patient have difficulty concentrating, remembering, or making decisions?: No Does the patient have difficulty walking or climbing stairs?: No Does the patient have difficulty dressing or bathing?: No Does the patient have difficulty doing errands alone such as visiting a doctor's office or shopping?: No    Assessment & Plan  1. Major depression in remission ([place])  - escitalopram (LEXAPRO) 10 MG tablet; Take 1 tablet (10  mg total) by mouth daily.  Dispense: 90 tablet; Refill: 1  2. Psychophysiological insomnia  - Daridorexant HCl (QUVIVIQ) 50 MG TABS; Take 1 tablet by mouth at bedtime. For sleep  Dispense: 30 tablet; Refill: 2  3. Need for immunization against influenza  - Flu Vaccine QUAD 6+ mos PF IM (Fluarix Quad PF)  4. Dyslipidemia   5. Vitamin D deficiency  Continue supplementation   6. GAD (generalized anxiety disorder)   7. Migraine without aura and without status migrainosus, not intractable  He does not ant any other medications at this time   8. B12 deficiency

## 2022-01-11 ENCOUNTER — Encounter: Payer: Self-pay | Admitting: Family Medicine

## 2022-01-11 ENCOUNTER — Ambulatory Visit: Payer: BC Managed Care – PPO | Admitting: Family Medicine

## 2022-01-11 VITALS — BP 128/68 | HR 84 | Resp 16 | Ht 70.0 in | Wt 242.0 lb

## 2022-01-11 DIAGNOSIS — G43009 Migraine without aura, not intractable, without status migrainosus: Secondary | ICD-10-CM

## 2022-01-11 DIAGNOSIS — Z23 Encounter for immunization: Secondary | ICD-10-CM | POA: Diagnosis not present

## 2022-01-11 DIAGNOSIS — F5104 Psychophysiologic insomnia: Secondary | ICD-10-CM | POA: Diagnosis not present

## 2022-01-11 DIAGNOSIS — F411 Generalized anxiety disorder: Secondary | ICD-10-CM

## 2022-01-11 DIAGNOSIS — E785 Hyperlipidemia, unspecified: Secondary | ICD-10-CM | POA: Diagnosis not present

## 2022-01-11 DIAGNOSIS — E538 Deficiency of other specified B group vitamins: Secondary | ICD-10-CM

## 2022-01-11 DIAGNOSIS — F325 Major depressive disorder, single episode, in full remission: Secondary | ICD-10-CM

## 2022-01-11 DIAGNOSIS — E559 Vitamin D deficiency, unspecified: Secondary | ICD-10-CM

## 2022-01-11 MED ORDER — QUVIVIQ 50 MG PO TABS: 1.0000 | ORAL_TABLET | Freq: Every evening | ORAL | 2 refills | Status: DC

## 2022-01-11 MED ORDER — ESCITALOPRAM OXALATE 10 MG PO TABS: 10.0000 mg | ORAL_TABLET | Freq: Every day | ORAL | 1 refills | Status: DC

## 2022-01-17 ENCOUNTER — Telehealth: Payer: Self-pay | Admitting: Family Medicine

## 2022-01-17 ENCOUNTER — Ambulatory Visit: Payer: BC Managed Care – PPO | Admitting: Nurse Practitioner

## 2022-01-17 ENCOUNTER — Encounter: Payer: Self-pay | Admitting: Nurse Practitioner

## 2022-01-17 VITALS — BP 118/76 | HR 98 | Temp 99.3°F | Resp 18 | Ht 70.0 in | Wt 237.8 lb

## 2022-01-17 DIAGNOSIS — R509 Fever, unspecified: Secondary | ICD-10-CM

## 2022-01-17 DIAGNOSIS — F325 Major depressive disorder, single episode, in full remission: Secondary | ICD-10-CM

## 2022-01-17 DIAGNOSIS — J029 Acute pharyngitis, unspecified: Secondary | ICD-10-CM

## 2022-01-17 LAB — POCT RAPID STREP A (OFFICE): Rapid Strep A Screen: NEGATIVE

## 2022-01-17 MED ORDER — AZITHROMYCIN 500 MG PO TABS: 500.0000 mg | ORAL_TABLET | Freq: Every day | ORAL | 0 refills | Status: AC

## 2022-01-17 NOTE — Progress Notes (Signed)
BP 118/76   Pulse 98   Temp 99.3 F (37.4 C)   Resp 18   Ht 5\' 10"  (1.778 m)   Wt 237 lb 12.8 oz (107.9 kg)   SpO2 99%   BMI 34.12 kg/m    Subjective:    Patient ID: Gregory Matthews, male    DOB: Apr 25, 1981, 41 y.o.   MRN: [id number]  HPI: Gregory Matthews is a 41 y.o. male  Chief Complaint  Patient presents with   Sore Throat    Swollen glands w/ fever   Sore throat/fever: Patient reports fever, sore throat and lymphedema.  He says it started getting bad on Saturday.  He says he has taken zicam and nyquil cold and flu and drink some hot tea. Right side of throat is erythematous with exudate. Lymphadenopathy noted on right side.  Rapid strep was negative however physical exam indicates strep throat.  We will go ahead and start treatment.  We will also obtain COVID PCR.  Discussed COVID treatment.  Patient is interested in treatment if positive.  Relevant past medical, surgical, family and social history reviewed and updated as indicated. Interim medical history since our last visit reviewed. Allergies and medications reviewed and updated.  Review of Systems  Constitutional: positive for fever, negative for  weight change.  HEENT: positive for sore throat Respiratory: Negative for cough and shortness of breath.   Cardiovascular: Negative for chest pain or palpitations.  Gastrointestinal: Negative for abdominal pain, no bowel changes.  Musculoskeletal: Negative for gait problem or joint swelling.  Skin: Negative for rash.  Neurological: Negative for dizziness or headache.  No other specific complaints in a complete review of systems (except as listed in HPI above).      Objective:    BP 118/76   Pulse 98   Temp 99.3 F (37.4 C)   Resp 18   Ht 5\' 10"  (1.778 m)   Wt 237 lb 12.8 oz (107.9 kg)   SpO2 99%   BMI 34.12 kg/m   Wt Readings from Last 3 Encounters:  01/17/22 237 lb 12.8 oz (107.9 kg)  01/11/22 242 lb (109.8 kg)  10/12/21 235 lb (106.6 kg)    Physical  Exam  Constitutional: Patient appears well-developed and well-nourished. Obese  No distress.  HEENT: head atraumatic, normocephalic, pupils equal and reactive to light, ears TMs clear, neck supple, throat erythematous with exudate, lymphadenopathy noted on right side Cardiovascular: Normal rate, regular rhythm and normal heart sounds.  No murmur heard. No BLE edema. Pulmonary/Chest: Effort normal and breath sounds normal. No respiratory distress. Abdominal: Soft.  There is no tenderness. Psychiatric: Patient has a normal mood and affect. behavior is normal. Judgment and thought content normal.   Results for orders placed or performed in visit on 01/17/22  POCT rapid strep A  Result Value Ref Range   Rapid Strep A Screen Negative Negative      Assessment & Plan:   Problem List Items Addressed This Visit   None Visit Diagnoses     Fever, unspecified fever cause    -  Primary   Continue taking Tylenol for fever.  We will start azithromycin 500 mg daily for 10 days.  Awaiting COVID results.   Relevant Medications   azithromycin (ZITHROMAX) 500 MG tablet   Other Relevant Orders   POCT rapid strep A (Completed)   Novel Coronavirus, NAA ([facility])   Sore throat       Start taking azithromycin 500 mg daily for 10 days.  Rapid strep was negative however physical exam is consistent with strep.   Relevant Medications   azithromycin (ZITHROMAX) 500 MG tablet   Other Relevant Orders   POCT rapid strep A (Completed)   Novel Coronavirus, NAA ([facility])        Follow up plan: Return if symptoms worsen or fail to improve.

## 2022-01-18 ENCOUNTER — Other Ambulatory Visit: Payer: Self-pay | Admitting: Nurse Practitioner

## 2022-01-18 ENCOUNTER — Encounter: Payer: Self-pay | Admitting: Nurse Practitioner

## 2022-01-18 DIAGNOSIS — U071 COVID-19: Secondary | ICD-10-CM

## 2022-01-18 LAB — SPECIMEN STATUS REPORT

## 2022-01-18 LAB — NOVEL CORONAVIRUS, NAA: SARS-CoV-2, NAA: DETECTED — AB

## 2022-01-18 MED ORDER — NIRMATRELVIR/RITONAVIR (PAXLOVID)TABLET: 3.0000 | ORAL_TABLET | Freq: Two times a day (BID) | ORAL | 0 refills | Status: AC

## 2022-02-02 NOTE — Telephone Encounter (Signed)
Pt informed

## 2022-02-02 NOTE — Telephone Encounter (Signed)
Pt needs refill on Quviviq to be sent to [facility] on [facility]

## 2022-03-31 ENCOUNTER — Other Ambulatory Visit
Admission: RE | Admit: 2022-03-31 | Discharge: 2022-03-31 | Disposition: A | Discharge status: Home / Self Care | Payer: BC Managed Care – PPO | Source: Ambulatory Visit | Attending: Family Medicine | Admitting: Family Medicine

## 2022-03-31 ENCOUNTER — Encounter: Payer: Self-pay | Admitting: Family Medicine

## 2022-03-31 ENCOUNTER — Ambulatory Visit: Payer: BC Managed Care – PPO | Admitting: Family Medicine

## 2022-03-31 VITALS — BP 122/68 | HR 97 | Temp 97.7°F | Resp 16 | Ht 70.0 in | Wt 240.0 lb

## 2022-03-31 DIAGNOSIS — R1031 Right lower quadrant pain: Secondary | ICD-10-CM | POA: Insufficient documentation

## 2022-03-31 DIAGNOSIS — R6883 Chills (without fever): Secondary | ICD-10-CM | POA: Insufficient documentation

## 2022-03-31 DIAGNOSIS — R52 Pain, unspecified: Secondary | ICD-10-CM | POA: Diagnosis present

## 2022-03-31 DIAGNOSIS — R110 Nausea: Secondary | ICD-10-CM | POA: Diagnosis present

## 2022-03-31 DIAGNOSIS — R630 Anorexia: Secondary | ICD-10-CM

## 2022-03-31 LAB — COMPREHENSIVE METABOLIC PANEL
ALT: 48 U/L — ABNORMAL HIGH (ref 0–44)
AST: 27 U/L (ref 15–41)
Albumin: 4.2 g/dL (ref 3.5–5.0)
Alkaline Phosphatase: 50 U/L (ref 38–126)
Anion gap: 7 (ref 5–15)
BUN: 14 mg/dL (ref 6–20)
CO2: 25 mmol/L (ref 22–32)
Calcium: 8.9 mg/dL (ref 8.9–10.3)
Chloride: 105 mmol/L (ref 98–111)
Creatinine, Ser: 0.98 mg/dL (ref 0.61–1.24)
GFR, Estimated: >60 mL/min (ref >60)
Glucose, Bld: 98 mg/dL (ref 70–99)
Potassium: 3.9 mmol/L (ref 3.5–5.1)
Sodium: 137 mmol/L (ref 135–145)
Total Bilirubin: 0.7 mg/dL (ref 0.3–1.2)
Total Protein: 8.1 g/dL (ref 6.5–8.1)

## 2022-03-31 LAB — CBC WITH DIFFERENTIAL/PLATELET
Abs Immature Granulocytes: 0.02 10*3/uL (ref 0.00–0.07)
Basophils Absolute: 0 10*3/uL (ref 0.0–0.1)
Basophils Relative: 0 %
Eosinophils Absolute: 0.1 10*3/uL (ref 0.0–0.5)
Eosinophils Relative: 2 %
HCT: 45.4 % (ref 39.0–52.0)
Hemoglobin: 15.5 g/dL (ref 13.0–17.0)
Immature Granulocytes: 0 %
Lymphocytes Relative: 21 %
Lymphs Abs: 1.5 10*3/uL (ref 0.7–4.0)
MCH: 29.9 pg (ref 26.0–34.0)
MCHC: 34.1 g/dL (ref 30.0–36.0)
MCV: 87.6 fL (ref 80.0–100.0)
Monocytes Absolute: 0.8 10*3/uL (ref 0.1–1.0)
Monocytes Relative: 11 %
Neutro Abs: 4.9 10*3/uL (ref 1.7–7.7)
Neutrophils Relative %: 66 %
Platelets: 304 10*3/uL (ref 150–400)
RBC: 5.18 MIL/uL (ref 4.22–5.81)
RDW: 12.3 % (ref 11.5–15.5)
WBC: 7.3 10*3/uL (ref 4.0–10.5)
nRBC: 0 % (ref 0.0–0.2)

## 2022-03-31 LAB — POCT INFLUENZA A/B
Influenza A, POC: NEGATIVE
Influenza B, POC: NEGATIVE

## 2022-03-31 NOTE — Progress Notes (Signed)
Name: Gregory Matthews   MRN: [id number]    DOB: 1981/03/04   Date:03/31/2022       Progress Note  Subjective  Chief Complaint  Chills/Fever  HPI  He states symptoms started suddenly last night after choir practice. He developed nausea and body aches, lack of appetite, chills and in the middle of the night temperature 99.9. He had mild headache at work yesterday. Today he has noticed some sinus pressure. He has noticed some abdominal discomfort   He denies dysuria, cough, sore throat, rhinorrhea, rashes.   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  Patient Active Problem List   Diagnosis Date Noted   Major depression in remission (HCC) 04/14/2021   B12 deficiency 04/14/2021   Vitamin D deficiency 04/14/2021   GAD (generalized anxiety disorder) 04/14/2021   Headache, migraine 06/08/2015   Obesity (BMI 30.0-34.9) 06/08/2015   Allergic rhinitis, seasonal 07/26/2013    Past Surgical History:  Procedure Laterality Date   HAIR TRANSPLANT     RHINOPLASTY     TONSILLECTOMY      Family History  Problem Relation Age of Onset   Cancer Mother        Breast   Hypertension Father    Heart disease Father    Heart attack Father    Cancer Maternal Aunt        Breast   Hyperlipidemia Maternal Grandmother    Parkinson's disease Paternal Grandfather     Social History   Tobacco Use   Smoking status: Never   Smokeless tobacco: Never  Substance Use Topics   Alcohol use: No    Alcohol/week: 0.0 standard drinks of alcohol     Current Outpatient Medications:    amphetamine-dextroamphetamine (ADDERALL) 10 MG tablet, Take 1 tablet (10 mg total) by mouth 2 (two) times daily., Disp: 180 tablet, Rfl: 0   Daridorexant HCl (QUVIVIQ) 50 MG TABS, Take 1 tablet by mouth at bedtime. For sleep, Disp: 30 tablet, Rfl: 2   divalproex (DEPAKOTE) 250 MG DR tablet, Take 1 tablet (250 mg total) by mouth every evening., Disp: 90 tablet, Rfl: 1   escitalopram (LEXAPRO) 10 MG tablet, Take 1  tablet (10 mg total) by mouth daily., Disp: 90 tablet, Rfl: 1   hydrOXYzine (ATARAX) 10 MG tablet, Take 1 tablet (10 mg total) by mouth daily as needed for anxiety., Disp: 30 tablet, Rfl: 0  Allergies  Allergen Reactions   Penicillins Rash    I personally reviewed active problem list, medication list, allergies, family history, social history, health maintenance with the patient/caregiver today.   ROS  Ten systems reviewed and is negative except as mentioned in HPI   Objective  Vitals:   03/31/22 1134  BP: 122/68  Pulse: 97  Resp: 16  Temp: 97.7 F (36.5 C)  TempSrc: Oral  SpO2: 98%  Weight: 240 lb (108.9 kg)  Height: 5\' 10"  (1.778 m)    Body mass index is 34.44 kg/m.  Physical Exam  Constitutional: Patient appears well-developed and well-nourished. Obese  No distress.  HEENT: head atraumatic, normocephalic, pupils equal and reactive to light, ears normal TM, neck supple, throat within normal limits Cardiovascular: Normal rate, regular rhythm and normal heart sounds.  No murmur heard. No BLE edema. Pulmonary/Chest: Effort normal and breath sounds normal. No respiratory distress. Abdominal: Soft.  There is right lower quadrant tenderness. Negative rebound tenderness  Psychiatric: Patient has a normal mood and affect. behavior is normal. Judgment and thought content normal.   Recent  Results (from the past 2160 hour(s))  Novel Coronavirus, NAA ([facility])     Status: Abnormal   Collection Time: 01/17/22 12:00 AM   Specimen: Nasopharyngeal(NP) swabs in vial transport medium   Nasopharynge  Previous  Result Value Ref Range   SARS-CoV-2, NAA Detected (A) Not Detected    Comment: Patients who have a positive COVID-19 test result may now have treatment options. Treatment options are available for patients with mild to moderate symptoms and for hospitalized patients. Visit our website at [email or web address] for resources and information. This nucleic acid  amplification test was developed and its performance characteristics determined by [employer]. Nucleic acid amplification tests include RT-PCR and TMA. This test has not been FDA cleared or approved. This test has been authorized by FDA under an Emergency Use Authorization (EUA). This test is only authorized for the duration of time the declaration that circumstances exist justifying the authorization of the emergency use of in vitro diagnostic tests for detection of SARS-CoV-2 virus and/or diagnosis of COVID-19 infection under section 564(b)(1) of the Act, 21 U.S.C. [id number]) (1), unless the authorization is terminated or revoked sooner. When diagnostic testing is negativ e, the possibility of a false negative result should be considered in the context of a patient's recent exposures and the presence of clinical signs and symptoms consistent with COVID-19. An individual without symptoms of COVID-19 and who is not shedding SARS-CoV-2 virus would expect to have a negative (not detected) result in this assay.   Specimen status report     Status: None   Collection Time: 01/17/22 12:00 AM  Result Value Ref Range   specimen status report Comment     Comment: Please note Please note The date and/or time of collection was not indicated on the requisition as required by state and federal law.  The date of receipt of the specimen was used as the collection date if not supplied.   POCT rapid strep A     Status: Normal   Collection Time: 01/17/22 10:46 AM  Result Value Ref Range   Rapid Strep A Screen Negative Negative  POCT Influenza A/B     Status: None   Collection Time: 03/31/22 11:32 AM  Result Value Ref Range   Influenza A, POC Negative Negative   Influenza B, POC Negative Negative    PHQ2/9:    03/31/2022   11:26 AM 01/17/2022   10:42 AM 01/11/2022    3:23 PM 10/12/2021    3:16 PM 07/19/2021    3:44 PM  Depression screen PHQ 2/9  Decreased Interest 0 0 0 0 0   Down, Depressed, Hopeless 0 0 0 1 0  PHQ - 2 Score 0 0 0 1 0  Altered sleeping 1 0 2 3 1   Tired, decreased energy 1 0 1 3 0  Change in appetite 1 0 0 0 0  Feeling bad or failure about yourself  0 0 0 1 0  Trouble concentrating 0 0 0 1 0  Moving slowly or fidgety/restless 0 0 0 0 0  Suicidal thoughts 0 0 0 0 0  PHQ-9 Score 3 0 3 9 1   Difficult doing work/chores  Not difficult at all       phq 9 is negative   Fall Risk:    03/31/2022   11:26 AM 01/17/2022   10:42 AM 01/11/2022    3:23 PM 10/12/2021    3:16 PM 07/19/2021    3:44 PM  Fall Risk   Falls in the past year?  0 0 0 0 0  Number falls in past yr: 0 0 0 0 0  Injury with Fall? 0 0 0 0 0  Risk for fall due to : No Fall Risks  No Fall Risks No Fall Risks No Fall Risks  Follow up Falls prevention discussed  Falls prevention discussed Falls prevention discussed Falls prevention discussed      Functional Status Survey: Is the patient deaf or have difficulty hearing?: No Does the patient have difficulty seeing, even when wearing glasses/contacts?: No Does the patient have difficulty concentrating, remembering, or making decisions?: No Does the patient have difficulty walking or climbing stairs?: No Does the patient have difficulty dressing or bathing?: No Does the patient have difficulty doing errands alone such as visiting a doctor's office or shopping?: No    Assessment & Plan   1. Chills  - POCT Influenza A/B - Novel Coronavirus, NAA ([facility]) - CBC with Differential/Platelet; Future - Comprehensive metabolic panel; Future  2. Generalized body aches  - POCT Influenza A/B - Novel Coronavirus, NAA ([facility]) - CBC with Differential/Platelet; Future - Comprehensive metabolic panel; Future  3. Lack of appetite  -Novel Coronavirus, NAA ([facility]) - CBC with Differential/Platelet; Future - Comprehensive metabolic panel; Future  4. Nausea  - CBC with Differential/Platelet; Future - Comprehensive metabolic  panel; Future  5. Acute right lower quadrant pain  - CBC with Differential/Platelet; Future - Comprehensive metabolic panel; Future   Offered to get stat CT, may be appendicitis, he chose to check labs first and if abnormal get CBC, discussed importance of going to [facility] if increase in nausea, vomiting, bloody stools , increase in abdominal pain

## 2022-04-01 LAB — NOVEL CORONAVIRUS, NAA: SARS-CoV-2, NAA: NOT DETECTED

## 2022-04-01 LAB — SPECIMEN STATUS REPORT

## 2022-04-12 ENCOUNTER — Telehealth: Payer: Self-pay | Admitting: Family Medicine

## 2022-04-12 ENCOUNTER — Other Ambulatory Visit: Payer: Self-pay

## 2022-04-12 ENCOUNTER — Other Ambulatory Visit: Payer: Self-pay | Admitting: Family Medicine

## 2022-04-12 DIAGNOSIS — F5104 Psychophysiologic insomnia: Secondary | ICD-10-CM

## 2022-04-12 MED ORDER — QUVIVIQ 50 MG PO TABS: 1.0000 | ORAL_TABLET | Freq: Every evening | ORAL | 0 refills | Status: DC

## 2022-04-12 NOTE — Telephone Encounter (Signed)
Pt needs refill on Quviviq to be sent to be sent to [facility] on [address]. Pt has an appt on Thursday and is out of this med.

## 2022-04-13 NOTE — Progress Notes (Unsigned)
Name: Gregory Matthews   MRN: [id number]    DOB: [birth year]   Date:04/14/2022       Progress Note  Subjective  Chief Complaint  Consult  HPI   Recurrent episodes of Chest pain: He woke up in the middle of the night of Dec 8th, around 3:30 am with pressure on his chest, had palpitation and pain and numbness on right arm, he took two aspirins - not sure of the dose and fell back asleep. When he woke up he felt better . Previous episode was in June, it happened after work. At the time CRP negative and normal EKG. He is concerned due to family history of heart disease. He denies associated diaphoresis, nausea or vomiting   Explained it may be panic attacks but wise to get further evaluation  Patient Active Problem List   Diagnosis Date Noted   Major depression in remission ([place]) 04/14/2021   B12 deficiency 04/14/2021   Vitamin D deficiency 04/14/2021   GAD (generalized anxiety disorder) 04/14/2021   Headache, migraine 06/08/2015   Obesity (BMI 30.0-34.9) 06/08/2015   Allergic rhinitis, seasonal 07/26/2013    Past Surgical History:  Procedure Laterality Date   HAIR TRANSPLANT     RHINOPLASTY     TONSILLECTOMY      Family History  Problem Relation Age of Onset   Cancer Mother        Breast   Hypertension Father    Heart disease Father    Heart attack Father    Cancer Maternal Aunt        Breast   Hyperlipidemia Maternal Grandmother    Parkinson's disease Paternal Grandfather     Social History   Tobacco Use   Smoking status: Never   Smokeless tobacco: Never  Substance Use Topics   Alcohol use: No    Alcohol/week: 0.0 standard drinks of alcohol     Current Outpatient Medications:    amphetamine-dextroamphetamine (ADDERALL) 10 MG tablet, Take 1 tablet (10 mg total) by mouth 2 (two) times daily., Disp: 180 tablet, Rfl: 0   Daridorexant HCl (QUVIVIQ) 50 MG TABS, Take 1 tablet by mouth at bedtime. For sleep, Disp: 30 tablet, Rfl: 0   divalproex (DEPAKOTE) 250 MG DR  tablet, Take 1 tablet (250 mg total) by mouth every evening., Disp: 90 tablet, Rfl: 1   escitalopram (LEXAPRO) 10 MG tablet, Take 1 tablet (10 mg total) by mouth daily., Disp: 90 tablet, Rfl: 1   hydrOXYzine (ATARAX) 10 MG tablet, Take 1 tablet (10 mg total) by mouth daily as needed for anxiety. (Patient not taking: Reported on 04/14/2022), Disp: 30 tablet, Rfl: 0  Allergies  Allergen Reactions   Penicillins Rash    I personally reviewed active problem list, medication list, allergies, family history, social history, health maintenance with the patient/caregiver today.   ROS  Ten systems reviewed and is negative except as mentioned in HPI   Objective  Vitals:   04/14/22 0921  BP: 116/72  Pulse: 91  Resp: 16  SpO2: 97%  Weight: 242 lb (109.8 kg)  Height: 5\' 10"  (1.778 m)    Body mass index is 34.72 kg/m.  Physical Exam  Constitutional: Patient appears well-developed and well-nourished. Obese  No distress.  HEENT: head atraumatic, normocephalic, pupils equal and reactive to light,, neck supple Cardiovascular: Normal rate, regular rhythm and normal heart sounds.  No murmur heard. No BLE edema. Pulmonary/Chest: Effort normal and breath sounds normal. No respiratory distress. Abdominal: Soft.  There is no tenderness. Psychiatric:  Patient has a normal mood and affect. behavior is normal. Judgment and thought content normal.   Recent Results (from the past 2160 hour(s))  Novel Coronavirus, NAA ([facility])     Status: Abnormal   Collection Time: 01/17/22 12:00 AM   Specimen: Nasopharyngeal(NP) swabs in vial transport medium   Nasopharynge  Previous  Result Value Ref Range   SARS-CoV-2, NAA Detected (A) Not Detected    Comment: Patients who have a positive COVID-19 test result may now have treatment options. Treatment options are available for patients with mild to moderate symptoms and for hospitalized patients. Visit our website at [email or web address]  for resources and information. This nucleic acid amplification test was developed and its performance characteristics determined by [employer]. Nucleic acid amplification tests include RT-PCR and TMA. This test has not been FDA cleared or approved. This test has been authorized by FDA under an Emergency Use Authorization (EUA). This test is only authorized for the duration of time the declaration that circumstances exist justifying the authorization of the emergency use of in vitro diagnostic tests for detection of SARS-CoV-2 virus and/or diagnosis of COVID-19 infection under section 564(b)(1) of the Act, 21 U.S.C. [id number]) (1), unless the authorization is terminated or revoked sooner. When diagnostic testing is negativ e, the possibility of a false negative result should be considered in the context of a patient's recent exposures and the presence of clinical signs and symptoms consistent with COVID-19. An individual without symptoms of COVID-19 and who is not shedding SARS-CoV-2 virus would expect to have a negative (not detected) result in this assay.   Specimen status report     Status: None   Collection Time: 01/17/22 12:00 AM  Result Value Ref Range   specimen status report Comment     Comment: Please note Please note The date and/or time of collection was not indicated on the requisition as required by state and federal law.  The date of receipt of the specimen was used as the collection date if not supplied.   POCT rapid strep A     Status: Normal   Collection Time: 01/17/22 10:46 AM  Result Value Ref Range   Rapid Strep A Screen Negative Negative  Novel Coronavirus, NAA ([facility])     Status: None   Collection Time: 03/31/22 12:00 AM   Specimen: Nasopharyngeal(NP) swabs in vial transport medium   Nasopharynge  Previous  Result Value Ref Range   SARS-CoV-2, NAA Not Detected Not Detected    Comment: This nucleic acid amplification test was developed and its  performance characteristics determined by [employer]. Nucleic acid amplification tests include RT-PCR and TMA. This test has not been FDA cleared or approved. This test has been authorized by FDA under an Emergency Use Authorization (EUA). This test is only authorized for the duration of time the declaration that circumstances exist justifying the authorization of the emergency use of in vitro diagnostic tests for detection of SARS-CoV-2 virus and/or diagnosis of COVID-19 infection under section 564(b)(1) of the Act, 21 U.S.C. [id number]) (1), unless the authorization is terminated or revoked sooner. When diagnostic testing is negative, the possibility of a false negative result should be considered in the context of a patient's recent exposures and the presence of clinical signs and symptoms consistent with COVID-19. An individual without symptoms of COVID-19 and who is not shedding SARS-CoV-2 virus wo uld expect to have a negative (not detected) result in this assay.   Specimen status report     Status: None  Collection Time: 03/31/22 12:00 AM  Result Value Ref Range   specimen status report Comment     Comment: Please note Please note The date and/or time of collection was not indicated on the requisition as required by state and federal law.  The date of receipt of the specimen was used as the collection date if not supplied.   POCT Influenza A/B     Status: None   Collection Time: 03/31/22 11:32 AM  Result Value Ref Range   Influenza A, POC Negative Negative   Influenza B, POC Negative Negative  Comprehensive metabolic panel     Status: Abnormal   Collection Time: 03/31/22 12:44 PM  Result Value Ref Range   Sodium 137 135 - 145 mmol/L   Potassium 3.9 3.5 - 5.1 mmol/L   Chloride 105 98 - 111 mmol/L   CO2 25 22 - 32 mmol/L   Glucose, Bld 98 70 - 99 mg/dL    Comment: Glucose reference range applies only to samples taken after fasting for at least 8 hours.   BUN  14 6 - 20 mg/dL   Creatinine, Ser 6.94 0.61 - 1.24 mg/dL   Calcium 8.9 8.9 - 85.4 mg/dL   Total Protein 8.1 6.5 - 8.1 g/dL   Albumin 4.2 3.5 - 5.0 g/dL   AST 27 15 - 41 U/L   ALT 48 (H) 0 - 44 U/L   Alkaline Phosphatase 50 38 - 126 U/L   Total Bilirubin 0.7 0.3 - 1.2 mg/dL   GFR, Estimated >62 >70 mL/min    Comment: (NOTE) Calculated using the CKD-EPI Creatinine Equation (2021)    Anion gap 7 5 - 15    Comment: Performed at [facility], [address] [place], [place] [address]  CBC with Differential/Platelet     Status: None   Collection Time: 03/31/22 12:44 PM  Result Value Ref Range   WBC 7.3 4.0 - 10.5 K/uL   RBC 5.18 4.22 - 5.81 MIL/uL   Hemoglobin 15.5 13.0 - 17.0 g/dL   HCT 38.1 82.9 - 93.7 %   MCV 87.6 80.0 - 100.0 fL   MCH 29.9 26.0 - 34.0 pg   MCHC 34.1 30.0 - 36.0 g/dL   RDW 16.9 67.8 - 93.8 %   Platelets 304 150 - 400 K/uL   nRBC 0.0 0.0 - 0.2 %   Neutrophils Relative % 66 %   Neutro Abs 4.9 1.7 - 7.7 K/uL   Lymphocytes Relative 21 %   Lymphs Abs 1.5 0.7 - 4.0 K/uL   Monocytes Relative 11 %   Monocytes Absolute 0.8 0.1 - 1.0 K/uL   Eosinophils Relative 2 %   Eosinophils Absolute 0.1 0.0 - 0.5 K/uL   Basophils Relative 0 %   Basophils Absolute 0.0 0.0 - 0.1 K/uL   Immature Granulocytes 0 %   Abs Immature Granulocytes 0.02 0.00 - 0.07 K/uL    Comment: Performed at [facility], [address] [place], [place] [address]    PHQ2/9:    04/14/2022    9:26 AM 03/31/2022   11:26 AM 01/17/2022   10:42 AM 01/11/2022    3:23 PM 10/12/2021    3:16 PM  Depression screen PHQ 2/9  Decreased Interest 0 0 0 0 0  Down, Depressed, Hopeless 1 0 0 0 1  PHQ - 2 Score 1 0 0 0 1  Altered sleeping 1 1 0 2 3  Tired, decreased energy 0 1 0 1 3  Change in appetite  0 1 0 0 0  Feeling bad or failure about yourself  0 0 0 0 1  Trouble concentrating 0 0 0 0 1  Moving slowly or fidgety/restless 0 0 0 0 0  Suicidal thoughts 0 0 0 0 0  PHQ-9 Score 2 3 0 3 9   Difficult doing work/chores   Not difficult at all      phq 9 is positive   Fall Risk:    04/14/2022    9:20 AM 03/31/2022   11:26 AM 01/17/2022   10:42 AM 01/11/2022    3:23 PM 10/12/2021    3:16 PM  [place] in the past year? 0 0 0 0 0  Number falls in past yr: 0 0 0 0 0  Injury with Fall? 0 0 0 0 0  Risk for fall due to : No Fall Risks No Fall Risks  No Fall Risks No Fall Risks  Follow up Falls prevention discussed Falls prevention discussed  Falls prevention discussed Falls prevention discussed      Functional Status Survey: Is the patient deaf or have difficulty hearing?: No Does the patient have difficulty seeing, even when wearing glasses/contacts?: No Does the patient have difficulty concentrating, remembering, or making decisions?: No Does the patient have difficulty walking or climbing stairs?: No Does the patient have difficulty dressing or bathing?: No Does the patient have difficulty doing errands alone such as visiting a doctor's office or shopping?: No    Assessment & Plan  1. Recurrent chest pain  - Ambulatory referral to Cardiology

## 2022-04-14 ENCOUNTER — Ambulatory Visit (INDEPENDENT_AMBULATORY_CARE_PROVIDER_SITE_OTHER): Payer: BC Managed Care – PPO | Admitting: Family Medicine

## 2022-04-14 ENCOUNTER — Encounter: Payer: Self-pay | Admitting: Family Medicine

## 2022-04-14 VITALS — BP 116/72 | HR 91 | Resp 16 | Ht 70.0 in | Wt 242.0 lb

## 2022-04-14 DIAGNOSIS — R079 Chest pain, unspecified: Secondary | ICD-10-CM

## 2022-04-26 ENCOUNTER — Telehealth: Payer: Self-pay | Admitting: Cardiovascular Disease

## 2022-04-26 ENCOUNTER — Emergency Department
Admission: EM | Admit: 2022-04-26 | Discharge: 2022-04-26 | Disposition: A | Discharge status: Home / Self Care | Payer: BC Managed Care – PPO | Attending: Emergency Medicine | Admitting: Emergency Medicine

## 2022-04-26 ENCOUNTER — Encounter: Payer: Self-pay | Admitting: Emergency Medicine

## 2022-04-26 ENCOUNTER — Emergency Department: Payer: BC Managed Care – PPO

## 2022-04-26 ENCOUNTER — Other Ambulatory Visit: Payer: Self-pay

## 2022-04-26 DIAGNOSIS — R0789 Other chest pain: Secondary | ICD-10-CM | POA: Insufficient documentation

## 2022-04-26 DIAGNOSIS — R202 Paresthesia of skin: Secondary | ICD-10-CM | POA: Diagnosis not present

## 2022-04-26 DIAGNOSIS — R079 Chest pain, unspecified: Secondary | ICD-10-CM

## 2022-04-26 LAB — CBC WITH DIFFERENTIAL/PLATELET
Abs Immature Granulocytes: 0.02 10*3/uL (ref 0.00–0.07)
Basophils Absolute: 0.1 10*3/uL (ref 0.0–0.1)
Basophils Relative: 1 %
Eosinophils Absolute: 0.3 10*3/uL (ref 0.0–0.5)
Eosinophils Relative: 3 %
HCT: 45.6 % (ref 39.0–52.0)
Hemoglobin: 15.6 g/dL (ref 13.0–17.0)
Immature Granulocytes: 0 %
Lymphocytes Relative: 41 %
Lymphs Abs: 3.8 10*3/uL (ref 0.7–4.0)
MCH: 29.9 pg (ref 26.0–34.0)
MCHC: 34.2 g/dL (ref 30.0–36.0)
MCV: 87.4 fL (ref 80.0–100.0)
Monocytes Absolute: 0.7 10*3/uL (ref 0.1–1.0)
Monocytes Relative: 8 %
Neutro Abs: 4.4 10*3/uL (ref 1.7–7.7)
Neutrophils Relative %: 47 %
Platelets: 341 10*3/uL (ref 150–400)
RBC: 5.22 MIL/uL (ref 4.22–5.81)
RDW: 12.1 % (ref 11.5–15.5)
WBC: 9.2 10*3/uL (ref 4.0–10.5)
nRBC: 0 % (ref 0.0–0.2)

## 2022-04-26 LAB — COMPREHENSIVE METABOLIC PANEL
ALT: 54 U/L — ABNORMAL HIGH (ref 0–44)
AST: 25 U/L (ref 15–41)
Albumin: 4.1 g/dL (ref 3.5–5.0)
Alkaline Phosphatase: 39 U/L (ref 38–126)
Anion gap: 10 (ref 5–15)
BUN: 16 mg/dL (ref 6–20)
CO2: 23 mmol/L (ref 22–32)
Calcium: 9 mg/dL (ref 8.9–10.3)
Chloride: 105 mmol/L (ref 98–111)
Creatinine, Ser: 0.83 mg/dL (ref 0.61–1.24)
GFR, Estimated: >60 mL/min (ref >60)
Glucose, Bld: 107 mg/dL — ABNORMAL HIGH (ref 70–99)
Potassium: 3.7 mmol/L (ref 3.5–5.1)
Sodium: 138 mmol/L (ref 135–145)
Total Bilirubin: 0.8 mg/dL (ref 0.3–1.2)
Total Protein: 8 g/dL (ref 6.5–8.1)

## 2022-04-26 LAB — TROPONIN I (HIGH SENSITIVITY)
Troponin I (High Sensitivity): 5 ng/L (ref <18)
Troponin I (High Sensitivity): 4 ng/L (ref <18)

## 2022-04-26 MED ORDER — ASPIRIN 81 MG PO TBEC: 81.0000 mg | DELAYED_RELEASE_TABLET | Freq: Every day | ORAL | 2 refills | Status: DC

## 2022-04-26 MED ORDER — NITROGLYCERIN 0.4 MG SL SUBL
0.4000 mg | SUBLINGUAL_TABLET | Freq: Once | SUBLINGUAL | Status: AC
  Administered 2022-04-26: 0.4 mg via SUBLINGUAL
  Filled 2022-04-26: qty 1

## 2022-04-26 MED ORDER — NITROGLYCERIN 0.4 MG SL SUBL: 0.4000 mg | SUBLINGUAL_TABLET | SUBLINGUAL | 1 refills | Status: DC | PRN

## 2022-04-26 NOTE — Telephone Encounter (Signed)
-----   Message from Minna Merritts, MD sent at 04/26/2022  8:18 AM EST ----- If we have a cancellation or opening perhaps we can move this gentleman up in the schedule to be seen sooner than February Currently in the emergency room for chest pain, being discharged home ER doctor is requesting closer follow-up if possible Any provider thxTG

## 2022-04-26 NOTE — ED Triage Notes (Signed)
Patient ambulatory to triage with steady gait, without difficulty or distress noted; pt reports midsternal CP radiating accomp by sensation of heart racing and tingling in rt arm tonight

## 2022-04-26 NOTE — Telephone Encounter (Signed)
Attempted to call and move current appointment sooner. VM full, unable to leave msg

## 2022-04-26 NOTE — ED Provider Notes (Signed)
[facility] Provider Note    Event Date/Time   First MD Initiated Contact with Patient 04/26/22 0725     (approximate)   History   Chest Pain   HPI  Gregory Matthews is a 42 y.o. male  who presents to the emergency department today because of concern for chest pain. Started last night. Located in the center of his chest, he describes it as pressure like. It was accompanied by some tingling in his right hand. At the time of my exam still is having some discomfort. He denies any shortness of breath. He has had similar episodes a couple of times over the past few months and has seen his PCP about them. He is scheduled to see cardiology next month. The patient says that there is family history of heart disease.        Physical Exam   Triage Vital Signs: ED Triage Vitals  Enc Vitals Group     BP 04/26/22 0454 (!) 147/96     Pulse Rate 04/26/22 0454 (!) 108     Resp 04/26/22 0454 18     Temp 04/26/22 0454 97.8 F (36.6 C)     Temp src --      SpO2 04/26/22 0454 98 %     Weight 04/26/22 0439 240 lb (108.9 kg)     Height 04/26/22 0439 5\' 10"  (1.778 m)     Head Circumference --      Peak Flow --      Pain Score 04/26/22 0438 7     Pain Loc --      Pain Edu? --      Excl. in [place]? --     Most recent vital signs: Vitals:   04/26/22 0454 04/26/22 0655  BP: (!) 147/96 138/88  Pulse: (!) 108 61  Resp: 18 18  Temp: 97.8 F (36.6 C)   SpO2: 98% 97%   General: Awake, alert, oriented. CV:  Good peripheral perfusion. Regular rate and rhythm. No m/r/g. Resp:  Normal effort. Lungs clear. Abd:  No distention.  Other:  No lower extremity edema.    ED Results / Procedures / Treatments   Labs (all labs ordered are listed, but only abnormal results are displayed) Labs Reviewed  COMPREHENSIVE METABOLIC PANEL - Abnormal; Notable for the following components:      Result Value   Glucose, Bld 107 (*)    ALT 54 (*)    All other components within normal  limits  CBC WITH DIFFERENTIAL/PLATELET  TROPONIN I (HIGH SENSITIVITY)  TROPONIN I (HIGH SENSITIVITY)     EKG  I, Nance Pear, attending physician, personally viewed and interpreted this EKG  EKG Time: 0445 Rate: 70 Rhythm: normal sinus rhythm Axis: normal Intervals: qtc 427 QRS: narrow ST changes: no st elevation Impression: normal ekg   RADIOLOGY I independently interpreted and visualized the CXR. My interpretation: No pneumonia Radiology interpretation:  IMPRESSION:  No active cardiopulmonary disease.      PROCEDURES:  Critical Care performed: No  Procedures   MEDICATIONS ORDERED IN ED: Medications - No data to display   IMPRESSION / MDM / [place] / ED COURSE  I reviewed the triage vital signs and the nursing notes.                              Differential diagnosis includes, but is not limited to, ACS, pneumonia, PE, esophagitis, costochondritis.  Patient's presentation is most consistent with acute presentation with potential threat to life or bodily function.  Patient presented to the emergency department today because of concerns for chest pain.  Troponin was negative x 2.  EKG was normal.  Chest x-ray without concerning findings.  Patient was given nitroglycerin here in the emergency department which did help with his pain.  At this time given reassuring workup I think is reasonable for patient be discharged home.  Patient already has appointment scheduled with cardiology.  Will give patient prescription for low-dose aspirin and nitroglycerin.  Discussed return precautions.    FINAL CLINICAL IMPRESSION(S) / ED DIAGNOSES   Final diagnoses:  Chest pain, unspecified type         Note:  This document was prepared using Dragon voice recognition software and may include unintentional dictation errors.    Nance Pear, MD 04/26/22 [phone]

## 2022-04-26 NOTE — Discharge Instructions (Signed)
Please seek medical attention for any high fevers, chest pain, shortness of breath, change in behavior, persistent vomiting, bloody stool or any other new or concerning symptoms.  

## 2022-05-08 ENCOUNTER — Other Ambulatory Visit: Payer: Self-pay

## 2022-05-08 ENCOUNTER — Emergency Department
Admission: EM | Admit: 2022-05-08 | Discharge: 2022-05-08 | Disposition: A | Discharge status: Home / Self Care | Payer: BC Managed Care – PPO | Attending: Emergency Medicine | Admitting: Emergency Medicine

## 2022-05-08 ENCOUNTER — Emergency Department: Payer: BC Managed Care – PPO

## 2022-05-08 DIAGNOSIS — R0789 Other chest pain: Secondary | ICD-10-CM | POA: Diagnosis present

## 2022-05-08 DIAGNOSIS — R079 Chest pain, unspecified: Secondary | ICD-10-CM

## 2022-05-08 LAB — CBC
HCT: 44.7 % (ref 39.0–52.0)
Hemoglobin: 15.4 g/dL (ref 13.0–17.0)
MCH: 29.8 pg (ref 26.0–34.0)
MCHC: 34.5 g/dL (ref 30.0–36.0)
MCV: 86.6 fL (ref 80.0–100.0)
Platelets: 342 10*3/uL (ref 150–400)
RBC: 5.16 MIL/uL (ref 4.22–5.81)
RDW: 12 % (ref 11.5–15.5)
WBC: 8.5 10*3/uL (ref 4.0–10.5)
nRBC: 0 % (ref 0.0–0.2)

## 2022-05-08 LAB — HEPATIC FUNCTION PANEL
ALT: 45 U/L — ABNORMAL HIGH (ref 0–44)
AST: 27 U/L (ref 15–41)
Albumin: 4.6 g/dL (ref 3.5–5.0)
Alkaline Phosphatase: 40 U/L (ref 38–126)
Bilirubin, Direct: <0.1 mg/dL (ref 0.0–0.2)
Total Bilirubin: 0.6 mg/dL (ref 0.3–1.2)
Total Protein: 8.4 g/dL — ABNORMAL HIGH (ref 6.5–8.1)

## 2022-05-08 LAB — BASIC METABOLIC PANEL
Anion gap: 9 (ref 5–15)
BUN: 16 mg/dL (ref 6–20)
CO2: 22 mmol/L (ref 22–32)
Calcium: 9.2 mg/dL (ref 8.9–10.3)
Chloride: 105 mmol/L (ref 98–111)
Creatinine, Ser: 0.92 mg/dL (ref 0.61–1.24)
GFR, Estimated: >60 mL/min (ref >60)
Glucose, Bld: 99 mg/dL (ref 70–99)
Potassium: 3.7 mmol/L (ref 3.5–5.1)
Sodium: 136 mmol/L (ref 135–145)

## 2022-05-08 LAB — TROPONIN I (HIGH SENSITIVITY)
Troponin I (High Sensitivity): 3 ng/L (ref <18)
Troponin I (High Sensitivity): 4 ng/L (ref <18)

## 2022-05-08 LAB — LIPASE, BLOOD: Lipase: 40 U/L (ref 11–51)

## 2022-05-08 MED ORDER — FAMOTIDINE 20 MG PO TABS
40.0000 mg | ORAL_TABLET | Freq: Once | ORAL | Status: AC
  Administered 2022-05-08: 40 mg via ORAL
  Filled 2022-05-08: qty 2

## 2022-05-08 MED ORDER — ALUM & MAG HYDROXIDE-SIMETH 200-200-20 MG/5ML PO SUSP
30.0000 mL | Freq: Once | ORAL | Status: AC
  Administered 2022-05-08: 30 mL via ORAL
  Filled 2022-05-08: qty 30

## 2022-05-08 NOTE — Discharge Instructions (Signed)
Please keep appointment with cardiology. If symptoms worsen tonight, please return to the emergency department.

## 2022-05-08 NOTE — ED Provider Notes (Signed)
[facility] Provider Note  Patient Contact: 3:58 PM (approximate)   History   Chest Pain   HPI  Gregory Matthews is a 42 y.o. male with a history of migraines, depression and generalized anxiety, presents to the emergency department with midsternal chest pain.  Patient states that he has had this pain off and on for the past several months.  Patient states that sometimes he experiences the pain that awakens him from sleep.  He states that when he has discomfort, he can feel his heartbeat and sometimes fast.  He denies chest tightness or shortness of breath.  Denies exertional worsening of his pain.  He denies nausea, vomiting or diarrhea.  No pain in the upper back, recent illness or cough.  He denies use of exogenous hormones, recent travel, prolonged immobilization or daily smoking.  He has a follow-up appointment with his cardiologist tomorrow.  No dosage changes or medication changes.  No falls or mechanisms of trauma.  Patient states that he does use nitro for his chest pain which does improve his pain temporarily but then he gets a severe headache afterwards.      Physical Exam   Triage Vital Signs: ED Triage Vitals  Enc Vitals Group     BP 05/08/22 1334 137/83     Pulse Rate 05/08/22 1334 71     Resp 05/08/22 1334 18     Temp 05/08/22 1334 97.7 F (36.5 C)     Temp Source 05/08/22 1334 Oral     SpO2 05/08/22 1334 98 %     Weight 05/08/22 1335 240 lb (108.9 kg)     Height 05/08/22 1335 5\' 10"  (1.778 m)     Head Circumference --      Peak Flow --      Pain Score 05/08/22 1335 5     Pain Loc --      Pain Edu? --      Excl. in [place]? --     Most recent vital signs: Vitals:   05/08/22 1334 05/08/22 1801  BP: 137/83 134/80  Pulse: 71 74  Resp: 18 18  Temp: 97.7 F (36.5 C) 97.7 F (36.5 C)  SpO2: 98% 98%     General: Alert and in no acute distress. Eyes:  PERRL. EOMI. Head: No acute traumatic findings ENT:      Nose: No  congestion/rhinnorhea.      Mouth/Throat: Mucous membranes are moist. Neck: No stridor. No cervical spine tenderness to palpation. Cardiovascular:  Good peripheral perfusion Respiratory: Normal respiratory effort without tachypnea or retractions. Lungs CTAB. Good air entry to the bases with no decreased or absent breath sounds. Gastrointestinal: Bowel sounds 4 quadrants. Soft and nontender to palpation. No guarding or rigidity. No palpable masses. No distention. No CVA tenderness. Musculoskeletal: Full range of motion to all extremities.  Neurologic:  No gross focal neurologic deficits are appreciated.  Skin:   No rash noted Other:   ED Results / Procedures / Treatments   Labs (all labs ordered are listed, but only abnormal results are displayed) Labs Reviewed  HEPATIC FUNCTION PANEL - Abnormal; Notable for the following components:      Result Value   Total Protein 8.4 (*)    ALT 45 (*)    All other components within normal limits  BASIC METABOLIC PANEL  CBC  LIPASE, BLOOD  TROPONIN I (HIGH SENSITIVITY)  TROPONIN I (HIGH SENSITIVITY)     EKG  Normal sinus rhythm with no ST segment  elevation or other apparent arrhythmia.   RADIOLOGY  I personally viewed and evaluated these images as part of my medical decision making, as well as reviewing the written report by the radiologist.  ED Provider Interpretation: Chest x-ray unremarkable   PROCEDURES:  Critical Care performed: No  Procedures   MEDICATIONS ORDERED IN ED: Medications  alum & mag hydroxide-simeth (MAALOX/MYLANTA) 200-200-20 MG/5ML suspension 30 mL (30 mLs Oral Given 05/08/22 1750)  famotidine (PEPCID) tablet 40 mg (40 mg Oral Given 05/08/22 1750)     IMPRESSION / MDM / ASSESSMENT AND PLAN / ED COURSE  I reviewed the triage vital signs and the nursing notes.                              Assessment and plan: Chest pain:   42 year old male presents to the emergency department with midsternal chest pain  that is occurred off and on for the past several weeks.  Vital signs are reassuring at triage.  On exam, patient was alert and nontoxic-appearing with no increased work of breathing.  Delta troponin within range.  CBC and BMP reassuring.  Hepatic function panel shows mildly elevated AST but is otherwise unremarkable.  Lipase within range.  Chest x-ray shows no acute abnormality.  EKG indicates normal sinus rhythm without ST segment elevation or other apparent arrhythmia  Patient states that he feels significantly improved and that his chest pain is only a 2 out of 10 in intensity upon recheck.  Patient was given a GI cocktail prior to discharge.  He has a follow-up appointment with cardiology tomorrow.  Patient was cautioned to return to the emergency department with new or worsening symptoms if pain reoccurs tonight.   FINAL CLINICAL IMPRESSION(S) / ED DIAGNOSES   Final diagnoses:  Chest pain, unspecified type     Rx / DC Orders   ED Discharge Orders     None        Note:  This document was prepared using Dragon voice recognition software and may include unintentional dictation errors.   Vallarie Mare Penfield, PA-C 05/08/22 Cecille Amsterdam    Blake Divine, MD 05/08/22 1921

## 2022-05-08 NOTE — ED Triage Notes (Signed)
EMS reports on and off chest pain for the last several weeks.Pt reports this is his 3rd episode of chest pain, has not seen a cardiologist, but does have an appointment tomorrow with them.   Pt took 4-81mg  ASA and 3 SL nitro prior to arrival.  Last Nitro was 1215 today   EMS Vitals: 71 CBG 140/90 98.86f

## 2022-05-09 ENCOUNTER — Encounter: Payer: Self-pay | Admitting: Cardiology

## 2022-05-09 ENCOUNTER — Ambulatory Visit: Payer: BC Managed Care – PPO | Attending: Cardiovascular Disease | Admitting: Cardiology

## 2022-05-09 VITALS — BP 116/74 | HR 101 | Ht 70.0 in | Wt 237.4 lb

## 2022-05-09 DIAGNOSIS — E7800 Pure hypercholesterolemia, unspecified: Secondary | ICD-10-CM

## 2022-05-09 DIAGNOSIS — R072 Precordial pain: Secondary | ICD-10-CM

## 2022-05-09 MED ORDER — IVABRADINE HCL 5 MG PO TABS: ORAL_TABLET | ORAL | 0 refills | Status: DC

## 2022-05-09 MED ORDER — METOPROLOL TARTRATE 100 MG PO TABS: ORAL_TABLET | ORAL | 0 refills | Status: DC

## 2022-05-09 NOTE — Progress Notes (Signed)
Cardiology Office Note:    Date:  05/09/2022   ID:  Gregory Matthews, DOB 11-03-1980, MRN [id number]  PCP:  Steele Sizer, MD   [place] Providers Cardiologist:  None     Referring MD: Steele Sizer, MD   Chief Complaint  Patient presents with   Other    Chest pain, elevated HR and numbness/tingling right arm. Meds reviewed verbally with pt.   Gregory Matthews is a 42 y.o. male who is being seen today for the evaluation of chest pain at the request of Steele Sizer, MD.   History of Present Illness:    Gregory Matthews is a 42 y.o. male with a hx of migraines, hyperlipidemia, depression who presents with chest pain.  Symptoms of chest pain have been ongoing for about 6 months now.  Describes chest pressure not associated with exertion, also complains of prominent heartbeat.  He has a smart watch, heart rates have been normal when checked.  Has been to the ED multiple times, last presentation yesterday, workup has always been unrevealing, with no ischemic findings on EKG and normal troponins.  Notes some chest discomfort with palpation of sternum.  Father had a heart attack with stent placement in his early 42s.   Past Medical History:  Diagnosis Date   Acute pharyngitis    Hay fever    Migraine    Sinusitis, acute     Past Surgical History:  Procedure Laterality Date   HAIR TRANSPLANT     RHINOPLASTY     TONSILLECTOMY      Current Medications: Current Meds  Medication Sig   amphetamine-dextroamphetamine (ADDERALL) 10 MG tablet Take 1 tablet (10 mg total) by mouth 2 (two) times daily.   aspirin EC 81 MG tablet Take 1 tablet (81 mg total) by mouth daily. Swallow whole.   Daridorexant HCl (QUVIVIQ) 50 MG TABS Take 1 tablet by mouth at bedtime. For sleep   divalproex (DEPAKOTE) 250 MG DR tablet Take 1 tablet (250 mg total) by mouth every evening.   escitalopram (LEXAPRO) 10 MG tablet Take 1 tablet (10 mg total) by mouth daily.   ivabradine  (CORLANOR) 5 MG TABS tablet Take three tablets two hours prior to the test   metoprolol tartrate (LOPRESSOR) 100 MG tablet Take one tablet two hours prior to the test   nitroGLYCERIN (NITROSTAT) 0.4 MG SL tablet Place 1 tablet (0.4 mg total) under the tongue every 5 (five) minutes as needed for chest pain.     Allergies:   Penicillins   Social History   Socioeconomic History   Marital status: Married    Spouse name: Caryl Pina    Number of children: 0   Years of education: Not on file   Highest education level: Bachelor's degree (e.g., BA, AB, BS)  Occupational History   Occupation: Pharmacist, hospital   Tobacco Use   Smoking status: Never   Smokeless tobacco: Never  Vaping Use   Vaping Use: Never used  Substance and Sexual Activity   Alcohol use: No    Alcohol/week: 0.0 standard drinks of alcohol   Drug use: No   Sexual activity: Yes    Partners: Female  Other Topics Concern   Not on file  Social History Narrative   Married, he is a Pharmacist, hospital   Social Determinants of Health   Financial Resource Strain: [place]  (08/11/2020)   Overall Financial Resource Strain (CARDIA)    Difficulty of Paying Living Expenses: Not hard at all  Food Insecurity:  No Food Insecurity (08/11/2020)   Hunger Vital Sign    Worried About Running Out of Food in the Last Year: Never true    Ran Out of Food in the Last Year: Never true  Transportation Needs: No Transportation Needs (08/11/2020)   PRAPARE - Hydrologist (Medical): No    Lack of Transportation (Non-Medical): No  Physical Activity: Insufficiently Active (08/11/2020)   Exercise Vital Sign    Days of Exercise per Week: 2 days    Minutes of Exercise per Session: 30 min  Stress: Stress Concern Present (08/11/2020)   [place]    Feeling of Stress : To some extent  Social Connections: Socially Integrated (08/11/2020)   Social Connection and Isolation Panel  [NHANES]    Frequency of Communication with Friends and Family: More than three times a week    Frequency of Social Gatherings with Friends and Family: More than three times a week    Attends Religious Services: More than 4 times per year    Active Member of [employer] or Organizations: Yes    Attends Music therapist: More than 4 times per year    Marital Status: Married     Family History: The patient's family history includes Cancer in his maternal aunt and mother; Heart attack in his father, maternal grandfather, and paternal grandfather; Heart disease in his father; Hyperlipidemia in his maternal grandmother; Hypertension in his father; Parkinson's disease in his paternal grandfather.  ROS:   Please see the history of present illness.     All other systems reviewed and are negative.  EKGs/Labs/Other Studies Reviewed:    The following studies were reviewed today:   EKG:  EKG is  ordered today.  The ekg ordered today demonstrates sinus tachycardia, heart rate 101  Recent Labs: 05/08/2022: ALT 45; BUN 16; Creatinine, Ser 0.92; Hemoglobin 15.4; Platelets 342; Potassium 3.7; Sodium 136  Recent Lipid Panel    Component Value Date/Time   CHOL 211 (H) 10/12/2021 1625   TRIG 245 (H) 10/12/2021 1625   HDL 42 10/12/2021 1625   CHOLHDL 5.0 (H) 10/12/2021 1625   VLDL 21 12/08/2015 1003   LDLCALC 129 (H) 10/12/2021 1625     Risk Assessment/Calculations:             Physical Exam:    VS:  BP 116/74 (BP Location: Right Arm, Patient Position: Sitting, Cuff Size: Large)   Pulse (!) 101   Ht 5\' 10"  (1.778 m)   Wt 237 lb 6 oz (107.7 kg)   SpO2 97%   BMI 34.06 kg/m     Wt Readings from Last 3 Encounters:  05/09/22 237 lb 6 oz (107.7 kg)  05/08/22 240 lb (108.9 kg)  04/26/22 240 lb (108.9 kg)     GEN:  Well nourished, well developed in no acute distress HEENT: Normal NECK: No JVD; No carotid bruits CARDIAC: RRR, no murmurs, rubs, gallops RESPIRATORY:  Clear to  auscultation without rales, wheezing or rhonchi  ABDOMEN: Soft, non-tender, non-distended MUSCULOSKELETAL:  No edema; No deformity  SKIN: Warm and dry NEUROLOGIC:  Alert and oriented x 3 PSYCHIATRIC:  Normal affect   ASSESSMENT:    1. Precordial pain   2. Pure hypercholesterolemia    PLAN:    In order of problems listed above:  Chest pain, risk factors hyperlipidemia, family history of CAD.  Get echo, get coronary CTA.  Has some chest discomfort with palpation, musculoskeletal etiology  differential.  Panic/anxiety etiology also possible. Hyperlipidemia, coronary CT as above.  Low-cholesterol diet advised.  start Statin if atherosclerosis noted.  Follow-up after echo and coronary CTA.      Medication Adjustments/Labs and Tests Ordered: Current medicines are reviewed at length with the patient today.  Concerns regarding medicines are outlined above.  Orders Placed This Encounter  Procedures   CT CORONARY MORPH W/CTA COR W/SCORE W/CA W/CM &/OR WO/CM   EKG 12-Lead   ECHOCARDIOGRAM COMPLETE   Meds ordered this encounter  Medications   ivabradine (CORLANOR) 5 MG TABS tablet    Sig: Take three tablets two hours prior to the test    Dispense:  3 tablet    Refill:  0   metoprolol tartrate (LOPRESSOR) 100 MG tablet    Sig: Take one tablet two hours prior to the test    Dispense:  1 tablet    Refill:  0    Patient Instructions  Medication Instructions:  No changes *If you need a refill on your cardiac medications before your next appointment, please call your pharmacy*   Lab Work: None ordered If you have labs (blood work) drawn today and your tests are completely normal, you will receive your results only by: MyChart Message (if you have MyChart) OR A paper copy in the mail If you have any lab test that is abnormal or we need to change your treatment, we will call you to review the results.   Testing/Procedures: Your physician has requested that you have an  echocardiogram. Echocardiography is a painless test that uses sound waves to create images of your heart. It provides your doctor with information about the size and shape of your heart and how well your heart's chambers and valves are working.   You may receive an ultrasound enhancing agent through an IV if needed to better visualize your heart during the echo. This procedure takes approximately one hour.  There are no restrictions for this procedure.  This will take place at [address] [address], [place] [address]    Follow-Up: At [facility], you and your health needs are our priority.  As part of our continuing mission to provide you with exceptional heart care, we have created designated Provider Care Teams.  These Care Teams include your primary Cardiologist (physician) and Advanced Practice Providers (APPs -  Physician Assistants and Nurse Practitioners) who all work together to provide you with the care you need, when you need it.  We recommend signing up for the patient portal called "MyChart".  Sign up information is provided on this After Visit Summary.  MyChart is used to connect with patients for Virtual Visits (Telemedicine).  Patients are able to view lab/test results, encounter notes, upcoming appointments, etc.  Non-urgent messages can be sent to your provider as well.   To learn more about what you can do with MyChart, go to [email or web address].    Your next appointment:   Follow up after testing with Dr. Azucena Cecil or APP Other Instructions   Your cardiac CT will be scheduled at one of the below locations:   [facility] [address] [place], [place] [address] [phone]  OR  [facility] [address] [place], [place] [address] [phone]  OR   [facility] [address] [place], [place] [address] [phone]  If scheduled at  [facility], please arrive at the [facility] and Children's Entrance (Entrance  C2) of [facility] 30 minutes prior to test start time. You can use the FREE valet parking offered at entrance C (encouraged to control the heart rate for the test)  Proceed to the [facility] Radiology Department (first floor) to check-in and test prep.  All radiology patients and guests should use entrance C2 at [facility], accessed from [facility], even though the hospital's physical address listed is [address].    If scheduled at [facility] or [facility], please arrive 15 mins early for check-in and test prep.   Please follow these instructions carefully (unless otherwise directed):  Hold all erectile dysfunction medications at least 3 days (72 hrs) prior to test. (Ie viagra, cialis, sildenafil, tadalafil, etc) We will administer nitroglycerin during this exam.   On the Night Before the Test: Be sure to Drink plenty of water. Do not consume any caffeinated/decaffeinated beverages or chocolate 12 hours prior to your test. Do not take any antihistamines 12 hours prior to your test.  On the Day of the Test: Drink plenty of water until 1 hour prior to the test. Do not eat any food 1 hour prior to test. You may take your regular medications prior to the test.  Take metoprolol (Lopressor) and Ivabradine 15 mg two hours prior to test.   After the Test: Drink plenty of water. After receiving IV contrast, you may experience a mild flushed feeling. This is normal. On occasion, you may experience a mild rash up to 24 hours after the test. This is not dangerous. If this occurs, you can take Benadryl 25 mg and increase your fluid intake. If you experience trouble breathing, this can be serious. If it is severe call 911 IMMEDIATELY. If it is mild, please call our office. If you take any of these medications:  Glipizide/Metformin, Avandament, Glucavance, please do not take 48 hours after completing test unless otherwise instructed.  We will call to schedule your test 2-4 weeks out understanding that some insurance companies will need an authorization prior to the service being performed.   For non-scheduling related questions, please contact the cardiac imaging nurse navigator should you have any questions/concerns: Rockwell Alexandria, Cardiac Imaging Nurse Navigator Larey Brick, Cardiac Imaging Nurse Navigator [facility] Direct Office Dial: [phone]   For scheduling needs, including cancellations and rescheduling, please call [place], [phone].     Signed, Debbe Odea, MD  05/09/2022 10:40 AM    [facility]

## 2022-05-09 NOTE — Patient Instructions (Addendum)
Medication Instructions:  No changes *If you need a refill on your cardiac medications before your next appointment, please call your pharmacy*   Lab Work: None ordered If you have labs (blood work) drawn today and your tests are completely normal, you will receive your results only by: [place] (if you have MyChart) OR A paper copy in the mail If you have any lab test that is abnormal or we need to change your treatment, we will call you to review the results.   Testing/Procedures: Your physician has requested that you have an echocardiogram. Echocardiography is a painless test that uses sound waves to create images of your heart. It provides your doctor with information about the size and shape of your heart and how well your heart's chambers and valves are working.   You may receive an ultrasound enhancing agent through an IV if needed to better visualize your heart during the echo. This procedure takes approximately one hour.  There are no restrictions for this procedure.  This will take place at [place] [facility] [address], [place]    Follow-Up: At [facility], you and your health needs are our priority.  As part of our continuing mission to provide you with exceptional heart care, we have created designated Provider Care Teams.  These Care Teams include your primary Cardiologist (physician) and Advanced Practice Providers (APPs -  Physician Assistants and Nurse Practitioners) who all work together to provide you with the care you need, when you need it.  We recommend signing up for the patient portal called "MyChart".  Sign up information is provided on this After Visit Summary.  MyChart is used to connect with patients for Virtual Visits (Telemedicine).  Patients are able to view lab/test results, encounter notes, upcoming appointments, etc.  Non-urgent messages can be sent to your provider as well.   To learn more about what you can  do with MyChart, go to [email or web address].    Your next appointment:   Follow up after testing with Dr. Garen Lah or APP Other Instructions   Your cardiac CT will be scheduled at one of the below locations:   [facility] [address] [place], [place] [address] [phone]  [place] [address] [place], [place] [address] [phone]  [facility] [place], [place] [address] [phone]  If scheduled at [facility], please arrive at the [facility] and Children's Entrance (Entrance C2) of [facility] 30 minutes prior to test start time. You can use the FREE valet parking offered at entrance C (encouraged to control the heart rate for the test)  Proceed to the [facility] Radiology Department (first floor) to check-in and test prep.  All radiology patients and guests should use entrance C2 at [facility], accessed from [facility], even though the hospital's physical address listed is [address]    If scheduled at [facility] or [facility], please arrive 15 mins early for check-in and test prep.   Please follow these instructions carefully (unless otherwise directed):  Hold all erectile dysfunction medications at least 3 days (72 hrs) prior to test. (Ie viagra, cialis, sildenafil, tadalafil, etc) We will administer nitroglycerin during this exam.   On the Night Before the Test: Be sure to Drink plenty of water. Do not consume any caffeinated/decaffeinated beverages or chocolate 12  hours prior to your test. Do not take any antihistamines 12 hours prior to your test.  On the Day of the Test: Drink plenty of water until 1 hour prior to the test. Do not eat any food 1 hour prior to test. You may take your regular medications prior to the test.  Take metoprolol  (Lopressor) and Ivabradine 15 mg two hours prior to test.   After the Test: Drink plenty of water. After receiving IV contrast, you may experience a mild flushed feeling. This is normal. On occasion, you may experience a mild rash up to 24 hours after the test. This is not dangerous. If this occurs, you can take Benadryl 25 mg and increase your fluid intake. If you experience trouble breathing, this can be serious. If it is severe call 911 IMMEDIATELY. If it is mild, please call our office. If you take any of these medications: Glipizide/Metformin, Avandament, Glucavance, please do not take 48 hours after completing test unless otherwise instructed.  We will call to schedule your test 2-4 weeks out understanding that some insurance companies will need an authorization prior to the service being performed.   For non-scheduling related questions, please contact the cardiac imaging nurse navigator should you have any questions/concerns: Marchia Bond, Cardiac Imaging Nurse Navigator Gordy Clement, Cardiac Imaging Nurse Navigator [place] [facility] Direct Office Dial: [phone]   For scheduling needs, including cancellations and rescheduling, please call [place], [phone].

## 2022-05-10 ENCOUNTER — Ambulatory Visit: Payer: BC Managed Care – PPO | Admitting: Family Medicine

## 2022-05-11 ENCOUNTER — Other Ambulatory Visit: Payer: Self-pay

## 2022-05-11 ENCOUNTER — Encounter (HOSPITAL_COMMUNITY): Payer: Self-pay

## 2022-05-11 DIAGNOSIS — F325 Major depressive disorder, single episode, in full remission: Secondary | ICD-10-CM

## 2022-05-11 MED ORDER — AMPHETAMINE-DEXTROAMPHETAMINE 10 MG PO TABS: 10.0000 mg | ORAL_TABLET | Freq: Two times a day (BID) | ORAL | 0 refills | Status: DC

## 2022-05-11 NOTE — Telephone Encounter (Signed)
Pt needs refill on adderell to be sent to [facility]

## 2022-05-16 NOTE — Progress Notes (Deleted)
Name: Gregory Matthews   MRN: [id number]    DOB: February 20, 1981   Date:05/16/2022       Progress Note  Subjective  Chief Complaint  Follow Up  HPI  MDD/GAD:diagnosed 05/2019  he is getting marital counseling occasionally now that they are doing much better, but he still has solo sessions also but down to every 2 months.His Phq 9 has been normal .He is still taking Depakote and also Lexapro daily, plus Adderal for energy and focus occasionally takes it twice a day. 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 Stable on medication   Insomnia: he has tried Hydroxizine , Trazodone and Temazepam but either had side effects of inefective currently on Quviviq and has been able to fall and stay asleep , no side effects .   Low back pain: currently doing okay , he still has skelaxin at home    Migraine:We have tried Topamax and Imitrex. He has stopped both medication secondary to side effects. Topamax he stopped because of change in taste and Imitrex, because flushing sensation and chest tightness. Episodes of migraine are described as a dull  headache, followed by nausea and the pain intensifies and becomes throbbing or  pounding sensation in different parts of his head.  It is associated with photophobia, phonophobia and movement - such as walking. He stopped Elavil on his own, when he was taking medication episodes were a couple times a month, but is back to twice a week. He would like to hold off for now he will resume regular physical activity to see if it will improve symptoms    Dyslipidemia: reviewed labs with patient    The 10-year ASCVD risk score (Arnett DK, et al., 2019) is: 2%   Values used to calculate the score:     Age: 42 years     Sex: Male     Is Non-Hispanic African American: No     Diabetic: No     Tobacco smoker: No     Systolic Blood Pressure: 0000000 mmHg     Is BP treated: No     HDL Cholesterol: 42  mg/dL     Total Cholesterol: 211 mg/dL    B12 deficiency: taking SL B12 a few times a week   Vitamin D deficiency: advised daily supplementation .    Obesity: he is doing a weight loss challenge with his wife with the assistance of a personal trainer , he was also on keto but he has not been as compliant with his diet over the past few weeks.Marland KitchenHis initial weight was 245 lbs but since Dec 22  it has been stable in the mid 230's.  Fertility questions: married, same partner past 12 years, his wife got pregnant once but had a miscarriage in 22. She is 43 yo and he is 42 yo. He asked about sperm count , explained urologist don't check for that. Wife needs to see a fertility sub-specialist or OB that can order his test .   Patient Active Problem List   Diagnosis Date Noted   Major depression in remission ([place]) 04/14/2021   B12 deficiency 04/14/2021   Vitamin D deficiency 04/14/2021   GAD (generalized anxiety disorder) 04/14/2021   Headache, migraine 06/08/2015   Obesity (BMI 30.0-34.9) 06/08/2015   Allergic rhinitis, seasonal 07/26/2013    Past Surgical History:  Procedure Laterality Date   HAIR TRANSPLANT  RHINOPLASTY     TONSILLECTOMY      Family History  Problem Relation Age of Onset   Cancer Mother        Breast   Hypertension Father    Heart disease Father    Heart attack Father    Cancer Maternal Aunt        Breast   Hyperlipidemia Maternal Grandmother    Heart attack Maternal Grandfather    Heart attack Paternal Grandfather    Parkinson's disease Paternal Grandfather     Social History   Tobacco Use   Smoking status: Never   Smokeless tobacco: Never  Substance Use Topics   Alcohol use: No    Alcohol/week: 0.0 standard drinks of alcohol     Current Outpatient Medications:    amphetamine-dextroamphetamine (ADDERALL) 10 MG tablet, Take 1 tablet (10 mg total) by mouth 2 (two) times daily., Disp: 180 tablet, Rfl: 0   aspirin EC 81 MG tablet, Take 1 tablet (81 mg  total) by mouth daily. Swallow whole., Disp: 30 tablet, Rfl: 2   Daridorexant HCl (QUVIVIQ) 50 MG TABS, Take 1 tablet by mouth at bedtime. For sleep, Disp: 30 tablet, Rfl: 0   divalproex (DEPAKOTE) 250 MG DR tablet, Take 1 tablet (250 mg total) by mouth every evening., Disp: 90 tablet, Rfl: 1   escitalopram (LEXAPRO) 10 MG tablet, Take 1 tablet (10 mg total) by mouth daily., Disp: 90 tablet, Rfl: 1   ivabradine (CORLANOR) 5 MG TABS tablet, Take three tablets two hours prior to the test, Disp: 3 tablet, Rfl: 0   metoprolol tartrate (LOPRESSOR) 100 MG tablet, Take one tablet two hours prior to the test, Disp: 1 tablet, Rfl: 0   nitroGLYCERIN (NITROSTAT) 0.4 MG SL tablet, Place 1 tablet (0.4 mg total) under the tongue every 5 (five) minutes as needed for chest pain., Disp: 100 tablet, Rfl: 1  Allergies  Allergen Reactions   Penicillins Rash    I personally reviewed active problem list, medication list, allergies, family history, social history, health maintenance with the patient/caregiver today.   ROS  ***  Objective  There were no vitals filed for this visit.  There is no height or weight on file to calculate BMI.  Physical Exam ***   PHQ2/9:    04/14/2022    9:26 AM 03/31/2022   11:26 AM 01/17/2022   10:42 AM 01/11/2022    3:23 PM 10/12/2021    3:16 PM  Depression screen PHQ 2/9  Decreased Interest 0 0 0 0 0  Down, Depressed, Hopeless 1 0 0 0 1  PHQ - 2 Score 1 0 0 0 1  Altered sleeping 1 1 0 2 3  Tired, decreased energy 0 1 0 1 3  Change in appetite 0 1 0 0 0  Feeling bad or failure about yourself  0 0 0 0 1  Trouble concentrating 0 0 0 0 1  Moving slowly or fidgety/restless 0 0 0 0 0  Suicidal thoughts 0 0 0 0 0  PHQ-9 Score 2 3 0 3 9  Difficult doing work/chores   Not difficult at all      phq 9 is {gen pos [id number]   Fall Risk:    04/14/2022    9:20 AM 03/31/2022   11:26 AM 01/17/2022   10:42 AM 01/11/2022    3:23 PM 10/12/2021    3:16 PM  Fall Risk    Falls in the past year? 0 0 0 0 0  Number falls in  past yr: 0 0 0 0 0  Injury with Fall? 0 0 0 0 0  Risk for fall due to : No Fall Risks No Fall Risks  No Fall Risks No Fall Risks  Follow up Falls prevention discussed Falls prevention discussed  Falls prevention discussed Falls prevention discussed      Functional Status Survey:      Assessment & Plan  *** There are no diagnoses linked to this encounter.

## 2022-05-17 ENCOUNTER — Ambulatory Visit: Payer: BC Managed Care – PPO | Admitting: Family Medicine

## 2022-05-20 ENCOUNTER — Telehealth (HOSPITAL_COMMUNITY): Payer: Self-pay | Admitting: Emergency Medicine

## 2022-05-20 NOTE — Telephone Encounter (Signed)
Reaching out to patient to offer assistance regarding upcoming cardiac imaging study; pt verbalizes understanding of appt date/time, parking situation and where to check in, pre-test NPO status and medications ordered, and verified current allergies; name and call back number provided for further questions should they arise Marchia Bond RN Navigator Cardiac Imaging [facility] [phone] office [phone] cell  Arrival [facility] 100mg  metoprolol + 15mg  ivabradine Denies iv issues Aware contrast/ nitro

## 2022-05-23 ENCOUNTER — Ambulatory Visit
Admission: RE | Admit: 2022-05-23 | Discharge: 2022-05-23 | Disposition: A | Discharge status: Home / Self Care | Payer: BC Managed Care – PPO | Source: Ambulatory Visit | Attending: Cardiology | Admitting: Cardiology

## 2022-05-23 DIAGNOSIS — R072 Precordial pain: Secondary | ICD-10-CM

## 2022-05-23 MED ORDER — IOHEXOL 350 MG/ML SOLN
100.0000 mL | Freq: Once | INTRAVENOUS | Status: AC | PRN
  Administered 2022-05-23: 100 mL via INTRAVENOUS

## 2022-05-23 MED ORDER — METOPROLOL TARTRATE 5 MG/5ML IV SOLN
10.0000 mg | Freq: Once | INTRAVENOUS | Status: AC
  Administered 2022-05-23: 10 mg via INTRAVENOUS

## 2022-05-23 MED ORDER — NITROGLYCERIN 0.4 MG SL SUBL
0.8000 mg | SUBLINGUAL_TABLET | Freq: Once | SUBLINGUAL | Status: AC
  Administered 2022-05-23: 0.8 mg via SUBLINGUAL

## 2022-05-23 MED ORDER — ONDANSETRON HCL 4 MG/2ML IJ SOLN
4.0000 mg | Freq: Once | INTRAMUSCULAR | Status: AC
  Administered 2022-05-23: 4 mg via INTRAVENOUS

## 2022-05-23 NOTE — Progress Notes (Signed)
Patient tolerated procedure well. Ambulate w/o difficulty. Denies any lightheadedness or being dizzy. Pt denies any pain at this time. Sitting in chair, pt is encouraged to drink additional water throughout the day and reason explained to patient. Patient verbalized understanding and all questions answered. ABC intact. No further needs at this time. Discharge from procedure area w/o issues.  

## 2022-05-24 NOTE — Progress Notes (Unsigned)
Name: Gregory Matthews   MRN: [id number]    DOB: 05/06/1980   Date:05/25/2022       Progress Note  Subjective  Chief Complaint  Follow Up  HPI  MDD/GAD:diagnosed 05/2019  he is getting marital counseling occasionally now that they are doing much better, currently only on prn visits to his therapist. His Phq 9 has been normal .He is still taking Depakote and Lexapro daily, plus Adderal for energy and focus occasionally takes it twice a day. Marland Kitchen  He is worried about precordial chest pain but states otherwise feeling well, he would like to try coming off depakote, explained he can try to stop it .  Insomnia: he has tried Hydroxizine , Trazodone and Temazepam but either had side effects of inefective currently on Quviviq and has been able to fall and stay asleep , he needs a refill today   Low back pain: currently doing okay , he takes Skelaxin prn    Migraine:We have tried Topamax and Imitrex. He has stopped both medication secondary to side effects. Topamax he stopped because of change in taste and Imitrex, because flushing sensation and chest tightness. Episodes of migraine are described as a dull  headache, followed by nausea and the pain intensifies and becomes throbbing or  pounding sensation in different parts of his head.  It is associated with photophobia, phonophobia and movement - such as walking  He stopped  Elavil due to side effects. He was doing well but had an episode on Monday when he had to be off caffeine but otherwise episodes are down to two times a month on average    Dyslipidemia: reviewed labs with patient    The 10-year ASCVD risk score (Arnett DK, et al., 2019) is: 1.8%   Values used to calculate the score:     Age: 42 years     Sex: Male     Is Non-Hispanic African American: No     Diabetic: No     Tobacco smoker: No     Systolic Blood Pressure: 419 mmHg     Is BP treated: No     HDL Cholesterol: 42 mg/dL     Total Cholesterol: 211 mg/dL    B12 deficiency: taking  SL B12 a few times a week   Vitamin D deficiency: advised daily supplementation .    Obesity: he is doing a weight loss challenge with his wife with the assistance of a personal trainer , he was also on keto but he has not been as compliant with his diet over the past few weeks.Marland KitchenHis initial weight was 245 lbs but since Dec 22  it has been stable in the mid 230's Today a little higher since 238 lbs . He joined a [employer] program, they started last week.   Precordial pain : went to [facility] 04/2019 , he was seen by Dr. Mylo Red on 01/15 and cardiac calcium score was negative , echocardiogram was not done yet . Discussed results of cardiac calcium score with the patient, explained not secondary to plaque formation. He states episodes seems to be associated with tachycardia. He states episodes are random , at least twice a week, described as dull and pressure, when he has a long episode - over 30 minutes - he has right arm pain and tingling on right hand. That is when he went to [facility] , he was given NTG and has taken it multiple times, it seems to help with symptoms.   Patient Active Problem  List   Diagnosis Date Noted   Major depression in remission ([place]) 04/14/2021   B12 deficiency 04/14/2021   Vitamin D deficiency 04/14/2021   GAD (generalized anxiety disorder) 04/14/2021   Headache, migraine 06/08/2015   Obesity (BMI 30.0-34.9) 06/08/2015   Allergic rhinitis, seasonal 07/26/2013    Past Surgical History:  Procedure Laterality Date   HAIR TRANSPLANT     RHINOPLASTY     TONSILLECTOMY      Family History  Problem Relation Age of Onset   Cancer Mother        Breast   Hypertension Father    Heart disease Father    Heart attack Father    Cancer Maternal Aunt        Breast   Hyperlipidemia Maternal Grandmother    Heart attack Maternal Grandfather    Heart attack Paternal Grandfather    Parkinson's disease Paternal Grandfather     Social History   Tobacco Use   Smoking status:  Never   Smokeless tobacco: Never  Substance Use Topics   Alcohol use: No    Alcohol/week: 0.0 standard drinks of alcohol     Current Outpatient Medications:    amphetamine-dextroamphetamine (ADDERALL) 10 MG tablet, Take 1 tablet (10 mg total) by mouth 2 (two) times daily., Disp: 180 tablet, Rfl: 0   aspirin EC 81 MG tablet, Take 1 tablet (81 mg total) by mouth daily. Swallow whole., Disp: 30 tablet, Rfl: 2   Daridorexant HCl (QUVIVIQ) 50 MG TABS, Take 1 tablet by mouth at bedtime. For sleep, Disp: 30 tablet, Rfl: 0   divalproex (DEPAKOTE) 250 MG DR tablet, Take 1 tablet (250 mg total) by mouth every evening., Disp: 90 tablet, Rfl: 1   escitalopram (LEXAPRO) 10 MG tablet, Take 1 tablet (10 mg total) by mouth daily., Disp: 90 tablet, Rfl: 1   nitroGLYCERIN (NITROSTAT) 0.4 MG SL tablet, Place 1 tablet (0.4 mg total) under the tongue every 5 (five) minutes as needed for chest pain., Disp: 100 tablet, Rfl: 1   ivabradine (CORLANOR) 5 MG TABS tablet, Take three tablets two hours prior to the test (Patient not taking: Reported on 05/25/2022), Disp: 3 tablet, Rfl: 0   metoprolol tartrate (LOPRESSOR) 100 MG tablet, Take one tablet two hours prior to the test (Patient not taking: Reported on 05/25/2022), Disp: 1 tablet, Rfl: 0  Allergies  Allergen Reactions   Penicillins Rash    I personally reviewed active problem list, medication list, allergies, family history, social history, health maintenance with the patient/caregiver today.   ROS  Constitutional: Negative for fever or weight change.  Respiratory: Negative for cough and shortness of breath.   Cardiovascular: positive  for chest pain and palpitations.  Gastrointestinal: Negative for abdominal pain, no bowel changes.  Musculoskeletal: Negative for gait problem or joint swelling.  Skin: Negative for rash.  Neurological: Negative for dizziness or headache.  No other specific complaints in a complete review of systems (except as listed in  HPI above).   Objective  Vitals:   05/25/22 1506  BP: 120/70  Pulse: 96  Resp: 16  SpO2: 96%  Weight: 238 lb (108 kg)  Height: 5\' 10"  (1.778 m)    Body mass index is 34.15 kg/m.  Physical Exam  Constitutional: Patient appears well-developed and well-nourished. Obese  No distress.  HEENT: head atraumatic, normocephalic, pupils equal and reactive to light, neck supple Cardiovascular: Normal rate, regular rhythm and normal heart sounds.  No murmur heard. No BLE edema. He has some tenderness during  palpation on chest wall, more on right side  Pulmonary/Chest: Effort normal and breath sounds normal. No respiratory distress. Abdominal: Soft.  There is no tenderness. Psychiatric: Patient has a normal mood and affect. behavior is normal. Judgment and thought content normal.    PHQ2/9:    05/25/2022    3:06 PM 04/14/2022    9:26 AM 03/31/2022   11:26 AM 01/17/2022   10:42 AM 01/11/2022    3:23 PM  Depression screen PHQ 2/9  Decreased Interest 0 0 0 0 0  Down, Depressed, Hopeless 0 1 0 0 0  PHQ - 2 Score 0 1 0 0 0  Altered sleeping 1 1 1  0 2  Tired, decreased energy 0 0 1 0 1  Change in appetite 0 0 1 0 0  Feeling bad or failure about yourself  0 0 0 0 0  Trouble concentrating 0 0 0 0 0  Moving slowly or fidgety/restless 0 0 0 0 0  Suicidal thoughts 0 0 0 0 0  PHQ-9 Score 1 2 3  0 3  Difficult doing work/chores    Not difficult at all     phq 9 is negative   Fall Risk:    05/25/2022    3:06 PM 04/14/2022    9:20 AM 03/31/2022   11:26 AM 01/17/2022   10:42 AM 01/11/2022    3:23 PM  Fall Risk   Falls in the past year? 0 0 0 0 0  Number falls in past yr: 0 0 0 0 0  Injury with Fall? 0 0 0 0 0  Risk for fall due to : No Fall Risks No Fall Risks No Fall Risks  No Fall Risks  Follow up Falls prevention discussed Falls prevention discussed Falls prevention discussed  Falls prevention discussed      Functional Status Survey: Is the patient deaf or have difficulty hearing?:  No Does the patient have difficulty seeing, even when wearing glasses/contacts?: No Does the patient have difficulty concentrating, remembering, or making decisions?: No Does the patient have difficulty walking or climbing stairs?: No Does the patient have difficulty dressing or bathing?: No Does the patient have difficulty doing errands alone such as visiting a doctor's office or shopping?: No    Assessment & Plan  1. Major depression in remission (H[facility])  Continue Lexapro   2. Vitamin D deficiency  Continue supplementation   3. Psychophysiological insomnia  - Daridorexant HCl (QUVIVIQ) 50 MG TABS; Take 1 tablet (50 mg total) by mouth at bedtime. For sleep  Dispense: 30 tablet; Refill: 2  4. GAD (generalized anxiety disorder)  The anxiety may be causing chest pain   5. Migraine without aura and without status migrainosus, not intractable  Stable   6. Dyslipidemia   7. Precordial chest pain  Reviewed cardiac calcium score, waiting for echo that will be due Feb 16 th and has follow up with cardiologist shortly after   8. B12 deficiency  Taking supplementation

## 2022-05-25 ENCOUNTER — Encounter: Payer: Self-pay | Admitting: Family Medicine

## 2022-05-25 ENCOUNTER — Telehealth: Payer: Self-pay | Admitting: Cardiology

## 2022-05-25 ENCOUNTER — Ambulatory Visit (INDEPENDENT_AMBULATORY_CARE_PROVIDER_SITE_OTHER): Payer: BC Managed Care – PPO | Admitting: Family Medicine

## 2022-05-25 VITALS — BP 120/70 | HR 96 | Resp 16 | Ht 70.0 in | Wt 238.0 lb

## 2022-05-25 DIAGNOSIS — E559 Vitamin D deficiency, unspecified: Secondary | ICD-10-CM

## 2022-05-25 DIAGNOSIS — R072 Precordial pain: Secondary | ICD-10-CM

## 2022-05-25 DIAGNOSIS — G43009 Migraine without aura, not intractable, without status migrainosus: Secondary | ICD-10-CM

## 2022-05-25 DIAGNOSIS — F5104 Psychophysiologic insomnia: Secondary | ICD-10-CM | POA: Diagnosis not present

## 2022-05-25 DIAGNOSIS — F325 Major depressive disorder, single episode, in full remission: Secondary | ICD-10-CM

## 2022-05-25 DIAGNOSIS — F411 Generalized anxiety disorder: Secondary | ICD-10-CM | POA: Diagnosis not present

## 2022-05-25 DIAGNOSIS — E538 Deficiency of other specified B group vitamins: Secondary | ICD-10-CM

## 2022-05-25 DIAGNOSIS — E785 Hyperlipidemia, unspecified: Secondary | ICD-10-CM

## 2022-05-25 MED ORDER — QUVIVIQ 50 MG PO TABS: 1.0000 | ORAL_TABLET | Freq: Every evening | ORAL | 2 refills | Status: DC

## 2022-05-25 NOTE — Telephone Encounter (Signed)
Pt is calling to get results results of CT. Requesting return call.

## 2022-05-25 NOTE — Telephone Encounter (Signed)
Left voicemail message to call back for review of results.  

## 2022-05-27 NOTE — Telephone Encounter (Signed)
Patient reviewed results via mychart  Written by [name], CMA on 05/24/2022  7:54 AM EST Seen by patient Gregory Matthews on 05/25/2022  3:13 PM

## 2022-06-06 ENCOUNTER — Ambulatory Visit: Payer: BC Managed Care – PPO | Admitting: Cardiovascular Disease

## 2022-06-09 ENCOUNTER — Other Ambulatory Visit: Payer: BC Managed Care – PPO

## 2022-06-10 ENCOUNTER — Ambulatory Visit: Payer: BC Managed Care – PPO | Attending: Cardiology

## 2022-06-10 DIAGNOSIS — R072 Precordial pain: Secondary | ICD-10-CM | POA: Diagnosis not present

## 2022-06-10 LAB — ECHOCARDIOGRAM COMPLETE
Area-P 1/2: 4.36 cm2
S' Lateral: 3.1 cm

## 2022-06-14 ENCOUNTER — Encounter: Payer: Self-pay | Admitting: Cardiology

## 2022-06-14 ENCOUNTER — Ambulatory Visit: Payer: BC Managed Care – PPO | Attending: Cardiology | Admitting: Cardiology

## 2022-06-14 VITALS — BP 120/74 | HR 66 | Ht 70.0 in | Wt 225.2 lb

## 2022-06-14 DIAGNOSIS — R072 Precordial pain: Secondary | ICD-10-CM | POA: Diagnosis not present

## 2022-06-14 DIAGNOSIS — E7800 Pure hypercholesterolemia, unspecified: Secondary | ICD-10-CM | POA: Diagnosis not present

## 2022-06-14 MED ORDER — ISOSORBIDE MONONITRATE ER 30 MG PO TB24: 30.0000 mg | ORAL_TABLET | Freq: Every day | ORAL | 3 refills | Status: DC

## 2022-06-14 NOTE — Progress Notes (Signed)
Cardiology Office Note:    Date:  06/14/2022   ID:  Gregory Matthews, DOB 30-Apr-1980, MRN [id number]  PCP:  Steele Sizer, MD   [place] [facility] Cardiologist:  Kate Sable, MD     Referring MD: Steele Sizer, MD   Chief Complaint  Patient presents with   Follow-up    Testing F/U, still having occasional chest pain    History of Present Illness:    Gregory Matthews is a 42 y.o. male with a hx of migraines, hyperlipidemia, depression who presents for follow-up.  Previously seen with chest pain.  Echocardiogram and coronary CT was obtained to evaluate any obstructive CAD.  Nitroglycerin was prescribed.  He still has occasional chest pains, relieved after taking nitroglycerin.  Has been eating healthier, also losing weight in the process.  Prior notes Father had a heart attack with stent placement in his early 74s.   Past Medical History:  Diagnosis Date   Acute pharyngitis    Hay fever    Migraine    Sinusitis, acute     Past Surgical History:  Procedure Laterality Date   HAIR TRANSPLANT     RHINOPLASTY     TONSILLECTOMY      Current Medications: Current Meds  Medication Sig   amphetamine-dextroamphetamine (ADDERALL) 10 MG tablet Take 1 tablet (10 mg total) by mouth 2 (two) times daily.   aspirin EC 81 MG tablet Take 1 tablet (81 mg total) by mouth daily. Swallow whole.   Daridorexant HCl (QUVIVIQ) 50 MG TABS Take 1 tablet (50 mg total) by mouth at bedtime. For sleep   escitalopram (LEXAPRO) 10 MG tablet Take 1 tablet (10 mg total) by mouth daily.   isosorbide mononitrate (IMDUR) 30 MG 24 hr tablet Take 1 tablet (30 mg total) by mouth daily.   nitroGLYCERIN (NITROSTAT) 0.4 MG SL tablet Place 1 tablet (0.4 mg total) under the tongue every 5 (five) minutes as needed for chest pain.     Allergies:   Elavil [amitriptyline] and Penicillins   Social History   Socioeconomic History   Marital status: Married    Spouse name: Caryl Pina    Number  of children: 0   Years of education: Not on file   Highest education level: Bachelor's degree (e.g., BA, AB, BS)  Occupational History   Occupation: Pharmacist, hospital   Tobacco Use   Smoking status: Never    Passive exposure: Past   Smokeless tobacco: Never  Vaping Use   Vaping Use: Never used  Substance and Sexual Activity   Alcohol use: No    Alcohol/week: 0.0 standard drinks of alcohol   Drug use: No   Sexual activity: Yes    Partners: Female  Other Topics Concern   Not on file  Social History Narrative   Married, he is a Pharmacist, hospital   Social Determinants of Health   Financial Resource Strain: [place]  (08/11/2020)   Overall Financial Resource Strain (CARDIA)    Difficulty of Paying Living Expenses: Not hard at all  Food Insecurity: No Food Insecurity (08/11/2020)   Hunger Vital Sign    Worried About Running Out of Food in the Last Year: Never true    Ran Out of Food in the Last Year: Never true  Transportation Needs: No Transportation Needs (08/11/2020)   PRAPARE - Hydrologist (Medical): No    Lack of Transportation (Non-Medical): No  Physical Activity: Insufficiently Active (08/11/2020)   Exercise Vital Sign    Days  of Exercise per Week: 2 days    Minutes of Exercise per Session: 30 min  Stress: Stress Concern Present (08/11/2020)   [place]    Feeling of Stress : To some extent  Social Connections: Socially Integrated (08/11/2020)   Social Connection and Isolation Panel [NHANES]    Frequency of Communication with Friends and Family: More than three times a week    Frequency of Social Gatherings with Friends and Family: More than three times a week    Attends Religious Services: More than 4 times per year    Active Member of [employer] or Organizations: Yes    Attends Music therapist: More than 4 times per year    Marital Status: Married     Family History: The patient's  family history includes Cancer in his maternal aunt and mother; Heart attack in his father, maternal grandfather, and paternal grandfather; Heart disease in his father; Hyperlipidemia in his maternal grandmother; Hypertension in his father; Parkinson's disease in his paternal grandfather.  ROS:   Please see the history of present illness.     All other systems reviewed and are negative.  EKGs/Labs/Other Studies Reviewed:    The following studies were reviewed today:   EKG:  EKG not  ordered today.  Recent Labs: 05/08/2022: ALT 45; BUN 16; Creatinine, Ser 0.92; Hemoglobin 15.4; Platelets 342; Potassium 3.7; Sodium 136  Recent Lipid Panel    Component Value Date/Time   CHOL 211 (H) 10/12/2021 1625   TRIG 245 (H) 10/12/2021 1625   HDL 42 10/12/2021 1625   CHOLHDL 5.0 (H) 10/12/2021 1625   VLDL 21 12/08/2015 1003   LDLCALC 129 (H) 10/12/2021 1625     Risk Assessment/Calculations:             Physical Exam:    VS:  BP 120/74 (BP Location: Left Arm, Patient Position: Sitting, Cuff Size: Normal)   Pulse 66   Ht 5' 10"$  (1.778 m)   Wt 225 lb 3.2 oz (102.2 kg)   SpO2 98%   BMI 32.31 kg/m     Wt Readings from Last 3 Encounters:  06/14/22 225 lb 3.2 oz (102.2 kg)  05/25/22 238 lb (108 kg)  05/09/22 237 lb 6 oz (107.7 kg)     GEN:  Well nourished, well developed in no acute distress HEENT: Normal NECK: No JVD; No carotid bruits CARDIAC: RRR, no murmurs, rubs, gallops RESPIRATORY:  Clear to auscultation without rales, wheezing or rhonchi  ABDOMEN: Soft, non-tender, non-distended MUSCULOSKELETAL:  No edema; No deformity  SKIN: Warm and dry NEUROLOGIC:  Alert and oriented x 3 PSYCHIATRIC:  Normal affect   ASSESSMENT:    1. Precordial pain   2. Pure hypercholesterolemia    PLAN:    In order of problems listed above:  Chest pain, risk factors hyperlipidemia, family history of CAD.  Coronary CT with concern score 0, no evidence of CAD.  Echo with normal EF.  Patient is  still with symptoms of chest pain, relieved with nitroglycerin.  Unsure if this is due to vasospasm.  Start Imdur 30 mg daily. Hyperlipidemia, low-cholesterol, heart healthy diet advised.  Repeat lipid panel in 3 months.  Follow-up in 3 months.     Medication Adjustments/Labs and Tests Ordered: Current medicines are reviewed at length with the patient today.  Concerns regarding medicines are outlined above.  Orders Placed This Encounter  Procedures   Lipid panel   Meds ordered this encounter  Medications  isosorbide mononitrate (IMDUR) 30 MG 24 hr tablet    Sig: Take 1 tablet (30 mg total) by mouth daily.    Dispense:  90 tablet    Refill:  3    Patient Instructions  Medication Instructions:   START Imdur - Take one tablet (55m ) by mouth daily.   *If you need a refill on your cardiac medications before your next appointment, please call your pharmacy*   Lab Work:  Your physician recommends that you return for lab work in: 3 months at the medical mall. You will need to be fasting.  No appt is needed. Hours are M-F 7AM- 6 PM.  If you have labs (blood work) drawn today and your tests are completely normal, you will receive your results only by: [facility](if you have MyChart) OR A paper copy in the mail If you have any lab test that is abnormal or we need to change your treatment, we will call you to review the results.   Testing/Procedures:  None Ordered   Follow-Up: [facility] you and your health needs are our priority.  As part of our continuing mission to provide you with exceptional heart care, we have created designated Provider Care Teams.  These Care Teams include your primary Cardiologist (physician) and Advanced Practice Providers (APPs -  Physician Assistants and Nurse Practitioners) who all work together to provide you with the care you need, when you need it.  We recommend signing up for the patient portal called "MyChart".  Sign up  information is provided on this After Visit Summary.  MyChart is used to connect with patients for Virtual Visits (Telemedicine).  Patients are able to view lab/test results, encounter notes, upcoming appointments, etc.  Non-urgent messages can be sent to your provider as well.   To learn more about what you can do with MyChart, go to [email or web address]    Your next appointment:   3 month(s)  Provider:   You may see BKate Sable MDONLY     Signed, BKate Sable MD  06/14/2022 4:41 PM    [place]

## 2022-06-14 NOTE — Patient Instructions (Signed)
Medication Instructions:   START Imdur - Take one tablet (2m ) by mouth daily.   *If you need a refill on your cardiac medications before your next appointment, please call your pharmacy*   Lab Work:  Your physician recommends that you return for lab work in: 3 months [facility]. You will need to be fasting.  No appt is needed. Hours are M-F 7AM- 6 PM.  If you have labs (blood work) drawn today and your tests are completely normal, you will receive your results only by: [place](if you have MyChart) OR A paper copy in the mail If you have any lab test that is abnormal or we need to change your treatment, we will call you to review the results.   Testing/Procedures:  None Ordered   Follow-Up: [facility] and your [facility] needs are our priority.  As part of our continuing mission to provide you with exceptional heart care, we have created designated Provider Care Teams.  These Care Teams include your primary Cardiologist (physician) and Advanced Practice Providers (APPs -  Physician Assistants and Nurse Practitioners) who all work together to provide you with the care you need, when you need it.  We recommend signing up for the patient portal called "MyChart".  Sign up information is provided on this After Visit Summary.  MyChart is used to connect with patients for Virtual Visits (Telemedicine).  Patients are able to view lab/test results, encounter notes, upcoming appointments, etc.  Non-urgent messages can be sent to your provider as well.   To learn more about what you can do with MyChart, go to [email or web address]    Your next appointment:   3 month(s)  Provider:   You may see BKate Sable MDONLY

## 2022-07-08 ENCOUNTER — Other Ambulatory Visit: Payer: Self-pay | Admitting: Family Medicine

## 2022-07-08 DIAGNOSIS — F325 Major depressive disorder, single episode, in full remission: Secondary | ICD-10-CM

## 2022-07-27 NOTE — Progress Notes (Unsigned)
Name: Gregory Matthews   MRN: [id number]    DOB: [birth year]   Date:07/28/2022       Progress Note  Subjective  Chief Complaint  Follow Up  HPI  MDD/GAD:diagnosed 05/2019  he is getting marital counseling occasionally now that they are doing much better, currently only on prn visits to his therapist. His Phq 9 has been normal .He is off Depakote and Lexapro daily, plus Adderal for energy and focus occasionally takes it twice a day. Marland KitchenHe states weight loss has helped his mood that is why he stopped depakote and is doing well   Insomnia: he has tried Hydroxizine , Trazodone and Temazepam but either had side effects of inefective currently on Quviviq and has been able to fall and stay asleep.  Low back pain: currently doing okay , he takes Skelaxin prn Unchanged    Migraine:We have tried Topamax and Imitrex. He has stopped both medication secondary to side effects. Topamax he stopped because of change in taste and Imitrex, because flushing sensation and chest tightness. Episodes of migraine are described as a dull  headache, followed by nausea and the pain intensifies and becomes throbbing or  pounding sensation in different parts of his head.  It is associated with photophobia, phonophobia and movement - such as walking  He stopped  Elavil due to side effects.He was on Depakote  for mood and migraine but he stopped medication and is taking Imdur and had a change in diet and episodes were more often but he is doing better now , no episodes this week    Dyslipidemia: reviewed labs with patient    The 10-year ASCVD risk score (Arnett DK, et al., 2019) is: 2.4%   Values used to calculate the score:     Age: 42 years     Sex: Male     Is Non-Hispanic African American: No     Diabetic: No     Tobacco smoker: No     Systolic Blood Pressure: [id number] mmHg     Is BP treated: No     HDL Cholesterol: 42 mg/dL     Total Cholesterol: 211 mg/dL    B12 deficiency: taking SL B12 a few times a week   Vitamin  D deficiency: advised daily supplementation .    Obesity:He has tried multiple diets. Tried Keto, personal trainers, [employer]. He joined a [employer] program January 2024 , currently taking some herbal drops , he skips breakfast and low carbohydrate diet. His weight is down to 212 lbs, initial weight was 237 lbs His goal weight is 195 lbs   Precordial pain : went to [facility] 04/2019 , he was seen by Dr. Mylo Red on 01/15 and cardiac calcium score was negative , echocardiogram was not done yet . Discussed results of cardiac calcium score with the patient, explained not secondary to plaque formation. He states episodes seems to be associated with tachycardia. He states episodes are random, described as dull and pressure, when he has a long episode - over 30 minutes - he has right arm pain and tingling on right hand. That is when he went to [facility] , he was given NTG and has taken it multiple times. He is now taking Imdur and episodes of chest pain have decreased in frequency down from weekly to no recent episodes in weeks   Patient Active Problem List   Diagnosis Date Noted   Major depression in remission 04/14/2021   B12 deficiency 04/14/2021   Vitamin D  deficiency 04/14/2021   GAD (generalized anxiety disorder) 04/14/2021   Headache, migraine 06/08/2015   Obesity (BMI 30.0-34.9) 06/08/2015   Allergic rhinitis, seasonal 07/26/2013    Past Surgical History:  Procedure Laterality Date   HAIR TRANSPLANT     RHINOPLASTY     TONSILLECTOMY      Family History  Problem Relation Age of Onset   Cancer Mother        Breast   Hypertension Father    Heart disease Father    Heart attack Father    Cancer Maternal Aunt        Breast   Hyperlipidemia Maternal Grandmother    Heart attack Maternal Grandfather    Heart attack Paternal Grandfather    Parkinson's disease Paternal Grandfather     Social History   Tobacco Use   Smoking status: Never    Passive exposure: Past   Smokeless  tobacco: Never  Substance Use Topics   Alcohol use: No    Alcohol/week: 0.0 standard drinks of alcohol     Current Outpatient Medications:    amphetamine-dextroamphetamine (ADDERALL) 10 MG tablet, Take 1 tablet (10 mg total) by mouth 2 (two) times daily., Disp: 180 tablet, Rfl: 0   Daridorexant HCl (QUVIVIQ) 50 MG TABS, Take 1 tablet (50 mg total) by mouth at bedtime. For sleep, Disp: 30 tablet, Rfl: 2   escitalopram (LEXAPRO) 10 MG tablet, TAKE 1 TABLET(10 MG) BY MOUTH DAILY, Disp: 90 tablet, Rfl: 0   isosorbide mononitrate (IMDUR) 30 MG 24 hr tablet, Take 1 tablet (30 mg total) by mouth daily., Disp: 90 tablet, Rfl: 3  Allergies  Allergen Reactions   Elavil [Amitriptyline] Anxiety   Penicillins Rash    I personally reviewed active problem list, medication list, allergies, family history, social history, health maintenance with the patient/caregiver today.   ROS  Constitutional: Negative for fever , positive for weight change.  Respiratory: Negative for cough and shortness of breath.   Cardiovascular: positive for intermittent chest pain or palpitations.  Gastrointestinal: Negative for abdominal pain, no bowel changes.  Musculoskeletal: Negative for gait problem or joint swelling.  Skin: Negative for rash.  Neurological: Negative for dizziness, positive for intermittent  headache.  No other specific complaints in a complete review of systems (except as listed in HPI above).   Objective  Vitals:   07/28/22 1017  BP: 132/72  Pulse: 94  Resp: 16  SpO2: 98%  Weight: 212 lb (96.2 kg)  Height: 5\' 10"  (1.778 m)    Body mass index is 30.42 kg/m.  Physical Exam  Constitutional: Patient appears well-developed and well-nourished. Obese  No distress.  HEENT: head atraumatic, normocephalic, pupils equal and reactive to light, neck supple Cardiovascular: Normal rate, regular rhythm and normal heart sounds.  No murmur heard. No BLE edema. Pulmonary/Chest: Effort normal and  breath sounds normal. No respiratory distress. Abdominal: Soft.  There is no tenderness. Psychiatric: Patient has a normal mood and affect. behavior is normal. Judgment and thought content normal.    PHQ2/9:    07/28/2022   10:17 AM 05/25/2022    3:06 PM 04/14/2022    9:26 AM 03/31/2022   11:26 AM 01/17/2022   10:42 AM  Depression screen PHQ 2/9  Decreased Interest 0 0 0 0 0  Down, Depressed, Hopeless 1 0 1 0 0  PHQ - 2 Score 1 0 1 0 0  Altered sleeping 3 1 1 1  0  Tired, decreased energy 0 0 0 1 0  Change in appetite  0 0 0 1 0  Feeling bad or failure about yourself  0 0 0 0 0  Trouble concentrating 0 0 0 0 0  Moving slowly or fidgety/restless 0 0 0 0 0  Suicidal thoughts 0 0 0 0 0  PHQ-9 Score 4 1 2 3  0  Difficult doing work/chores     Not difficult at all    phq 9 is positive   Fall Risk:    07/28/2022   10:17 AM 05/25/2022    3:06 PM 04/14/2022    9:20 AM 03/31/2022   11:26 AM 01/17/2022   10:42 AM  Fall Risk   Falls in the past year? 0 0 0 0 0  Number falls in past yr: 0 0 0 0 0  Injury with Fall? 0 0 0 0 0  Risk for fall due to : No Fall Risks No Fall Risks No Fall Risks No Fall Risks   Follow up Falls prevention discussed Falls prevention discussed Falls prevention discussed Falls prevention discussed       Functional Status Survey: Is the patient deaf or have difficulty hearing?: No Does the patient have difficulty seeing, even when wearing glasses/contacts?: No Does the patient have difficulty concentrating, remembering, or making decisions?: No Does the patient have difficulty walking or climbing stairs?: No Does the patient have difficulty dressing or bathing?: No Does the patient have difficulty doing errands alone such as visiting a doctor's office or shopping?: No    Assessment & Plan  1. Major depression in remission  - amphetamine-dextroamphetamine (ADDERALL) 10 MG tablet; Take 1 tablet (10 mg total) by mouth 2 (two) times daily.  Dispense: 180  tablet; Refill: 0  2. Migraine without aura and without status migrainosus, not intractable  Stable  3. Psychophysiological insomnia  - Daridorexant HCl (QUVIVIQ) 50 MG TABS; Take 1 tablet (50 mg total) by mouth at bedtime. For sleep  Dispense: 30 tablet; Refill: 2  4. Precordial chest pain  Doing better since started on Imdur  5. Dyslipidemia   6. B12 deficiency   7. Vitamin D deficiency

## 2022-07-28 ENCOUNTER — Ambulatory Visit: Payer: BC Managed Care – PPO | Admitting: Family Medicine

## 2022-07-28 ENCOUNTER — Encounter: Payer: Self-pay | Admitting: Family Medicine

## 2022-07-28 VITALS — BP 132/72 | HR 94 | Resp 16 | Ht 70.0 in | Wt 212.0 lb

## 2022-07-28 DIAGNOSIS — R072 Precordial pain: Secondary | ICD-10-CM | POA: Diagnosis not present

## 2022-07-28 DIAGNOSIS — G43009 Migraine without aura, not intractable, without status migrainosus: Secondary | ICD-10-CM

## 2022-07-28 DIAGNOSIS — E538 Deficiency of other specified B group vitamins: Secondary | ICD-10-CM

## 2022-07-28 DIAGNOSIS — E785 Hyperlipidemia, unspecified: Secondary | ICD-10-CM

## 2022-07-28 DIAGNOSIS — F5104 Psychophysiologic insomnia: Secondary | ICD-10-CM

## 2022-07-28 DIAGNOSIS — F325 Major depressive disorder, single episode, in full remission: Secondary | ICD-10-CM | POA: Diagnosis not present

## 2022-07-28 DIAGNOSIS — E559 Vitamin D deficiency, unspecified: Secondary | ICD-10-CM

## 2022-07-28 MED ORDER — QUVIVIQ 50 MG PO TABS: 1.0000 | ORAL_TABLET | Freq: Every evening | ORAL | 2 refills | Status: DC

## 2022-07-28 MED ORDER — AMPHETAMINE-DEXTROAMPHETAMINE 10 MG PO TABS: 10.0000 mg | ORAL_TABLET | Freq: Two times a day (BID) | ORAL | 0 refills | Status: DC

## 2022-09-23 ENCOUNTER — Encounter: Payer: Self-pay | Admitting: Cardiology

## 2022-09-23 ENCOUNTER — Ambulatory Visit: Payer: BC Managed Care – PPO | Attending: Cardiology | Admitting: Cardiology

## 2022-09-23 VITALS — BP 120/88 | HR 61 | Ht 70.0 in | Wt 203.2 lb

## 2022-09-23 DIAGNOSIS — E7800 Pure hypercholesterolemia, unspecified: Secondary | ICD-10-CM | POA: Diagnosis not present

## 2022-09-23 DIAGNOSIS — R072 Precordial pain: Secondary | ICD-10-CM | POA: Diagnosis not present

## 2022-09-23 MED ORDER — ISOSORBIDE MONONITRATE ER 30 MG PO TB24: 15.0000 mg | ORAL_TABLET | Freq: Every day | ORAL | 3 refills | Status: DC

## 2022-09-23 NOTE — Progress Notes (Signed)
Cardiology Office Note:    Date:  09/23/2022   ID:  [name], DOB [birth year], MRN [id number]  PCP:  Alba Cory, MD   [facility] Providers Cardiologist:  Debbe Odea, MD     Referring MD: Alba Cory, MD   Chief Complaint  Patient presents with   Follow-up    Patient denies new or acute cardiac problems/concerns today.  Increased headaches since starting Imdur.      History of Present Illness:    Gregory Matthews is a 42 y.o. male with a hx of migraines, hyperlipidemia, depression who presents for follow-up.    Previously seen for chest pain.  Echocardiogram and coronary CT was unrevealing.  Patient notes improvement in chest pain with taking sublingual nitro.  Imdur 30 mg daily was started, he denies any further episodes of chest pain while taking Imdur, however he notes headaches.  Denies any other concerns at this time.  States eating healthier, exercising, has lost over 20 pounds since last visit.  Prior notes Echo 05/2022 EF 60 to 65%, normal diastolic function Coronary CT 07/979 no CAD. Father had a heart attack with stent placement in his early 83s.   Past Medical History:  Diagnosis Date   Acute pharyngitis    Hay fever    Migraine    Sinusitis, acute     Past Surgical History:  Procedure Laterality Date   HAIR TRANSPLANT     RHINOPLASTY     TONSILLECTOMY      Current Medications: Current Meds  Medication Sig   amphetamine-dextroamphetamine (ADDERALL) 10 MG tablet Take 1 tablet (10 mg total) by mouth 2 (two) times daily.   Daridorexant HCl (QUVIVIQ) 50 MG TABS Take 1 tablet (50 mg total) by mouth at bedtime. For sleep   escitalopram (LEXAPRO) 10 MG tablet TAKE 1 TABLET(10 MG) BY MOUTH DAILY   [DISCONTINUED] isosorbide mononitrate (IMDUR) 30 MG 24 hr tablet Take 1 tablet (30 mg total) by mouth daily.     Allergies:   Elavil [amitriptyline] and Penicillins   Social History   Socioeconomic History   Marital status:  Married    Spouse name: Morrie Sheldon    Number of children: 0   Years of education: Not on file   Highest education level: Bachelor's degree (e.g., BA, AB, BS)  Occupational History   Occupation: Runner, broadcasting/film/video   Tobacco Use   Smoking status: Never    Passive exposure: Past   Smokeless tobacco: Never  Vaping Use   Vaping Use: Never used  Substance and Sexual Activity   Alcohol use: No    Alcohol/week: 0.0 standard drinks of alcohol   Drug use: No   Sexual activity: Yes    Partners: Female  Other Topics Concern   Not on file  Social History Narrative   Married, he is a Runner, broadcasting/film/video   Social Determinants of Health   Financial Resource Strain: Low Risk  (08/11/2020)   Overall Financial Resource Strain (CARDIA)    Difficulty of Paying Living Expenses: Not hard at all  Food Insecurity: No Food Insecurity (08/11/2020)   Hunger Vital Sign    Worried About Running Out of Food in the Last Year: Never true    Ran Out of Food in the Last Year: Never true  Transportation Needs: No Transportation Needs (08/11/2020)   PRAPARE - Administrator, Civil Service (Medical): No    Lack of Transportation (Non-Medical): No  Physical Activity: Insufficiently Active (08/11/2020)   Exercise Vital Sign  Days of Exercise per Week: 2 days    Minutes of Exercise per Session: 30 min  Stress: Stress Concern Present (08/11/2020)   [employer] of Occupational Health - Occupational Stress Questionnaire    Feeling of Stress : To some extent  Social Connections: Socially Integrated (08/11/2020)   Social Connection and Isolation Panel [NHANES]    Frequency of Communication with Friends and Family: More than three times a week    Frequency of Social Gatherings with Friends and Family: More than three times a week    Attends Religious Services: More than 4 times per year    Active Member of [employer] or Organizations: Yes    Attends Engineer, structural: More than 4 times per year    Marital Status:  Married     Family History: The patient's family history includes Cancer in his maternal aunt and mother; Heart attack in his father, maternal grandfather, and paternal grandfather; Heart disease in his father; Hyperlipidemia in his maternal grandmother; Hypertension in his father; Parkinson's disease in his paternal grandfather.  ROS:   Please see the history of present illness.     All other systems reviewed and are negative.  EKGs/Labs/Other Studies Reviewed:    The following studies were reviewed today:   EKG:  EKG not  ordered today.  Recent Labs: 05/08/2022: ALT 45; BUN 16; Creatinine, Ser 0.92; Hemoglobin 15.4; Platelets 342; Potassium 3.7; Sodium 136  Recent Lipid Panel    Component Value Date/Time   CHOL 211 (H) 10/12/2021 1625   TRIG 245 (H) 10/12/2021 1625   HDL 42 10/12/2021 1625   CHOLHDL 5.0 (H) 10/12/2021 1625   VLDL 21 12/08/2015 1003   LDLCALC 129 (H) 10/12/2021 1625     Risk Assessment/Calculations:             Physical Exam:    VS:  BP 120/88 (BP Location: Left Arm, Patient Position: Sitting, Cuff Size: Normal)   Pulse 61   Ht 5\' 10"  (1.778 m)   Wt 203 lb 3.2 oz (92.2 kg)   SpO2 97%   BMI 29.16 kg/m     Wt Readings from Last 3 Encounters:  09/23/22 203 lb 3.2 oz (92.2 kg)  07/28/22 212 lb (96.2 kg)  06/14/22 225 lb 3.2 oz (102.2 kg)     GEN:  Well nourished, well developed in no acute distress HEENT: Normal NECK: No JVD; No carotid bruits CARDIAC: RRR, no murmurs, rubs, gallops RESPIRATORY:  Clear to auscultation without rales, wheezing or rhonchi  ABDOMEN: Soft, non-tender, non-distended MUSCULOSKELETAL:  No edema; No deformity  SKIN: Warm and dry NEUROLOGIC:  Alert and oriented x 3 PSYCHIATRIC:  Normal affect   ASSESSMENT:    1. Precordial pain   2. Pure hypercholesterolemia    PLAN:    In order of problems listed above:  Chest pain, echo with normal EF, coronary CT with no CAD.  Improved with nitrates suggesting vasospasm.   Developed headaches with Imdur 30 mg daily.  Reduce Imdur to 15 mg daily.  If headache persist, plan to stop Imdur, consider Toprol-XL at follow-up visit for angina benefit. Hyperlipidemia, states eating healthier, plan to repeat lipid panel in 3 months.  Follow-up in 3-4 months.     Medication Adjustments/Labs and Tests Ordered: Current medicines are reviewed at length with the patient today.  Concerns regarding medicines are outlined above.  Orders Placed This Encounter  Procedures   Lipid panel   Meds ordered this encounter  Medications   isosorbide  mononitrate (IMDUR) 30 MG 24 hr tablet    Sig: Take 0.5 tablets (15 mg total) by mouth daily.    Dispense:  45 tablet    Refill:  3    Patient Instructions  Medication Instructions:  Your physician recommends the following medication changes.  DECREASE: Imdur to 15 mg by mouth daily  *If you need a refill on your cardiac medications before your next appointment, please call your pharmacy*   Lab Work: Your provider would like for you to return in 3 months to have the following labs drawn: (Lipid).   Please go to the [facility] entrance and check in at the front desk.  You do not need an appointment.  They are open from 7am-6 pm.  You will need to be fasting.    Testing/Procedures: None ordered today   Follow-Up: At [facility], you and your health needs are our priority.  As part of our continuing mission to provide you with exceptional heart care, we have created designated Provider Care Teams.  These Care Teams include your primary Cardiologist (physician) and Advanced Practice Providers (APPs -  Physician Assistants and Nurse Practitioners) who all work together to provide you with the care you need, when you need it.  We recommend signing up for the patient portal called "MyChart".  Sign up information is provided on this After Visit Summary.  MyChart is used to connect with patients for Virtual Visits  (Telemedicine).  Patients are able to view lab/test results, encounter notes, upcoming appointments, etc.  Non-urgent messages can be sent to your provider as well.   To learn more about what you can do with MyChart, go to [email or web address].    Your next appointment:   3 month(s)  Provider:   Debbe Odea, MD       Signed, Debbe Odea, MD  09/23/2022 4:46 PM    [facility]

## 2022-09-23 NOTE — Patient Instructions (Signed)
Medication Instructions:  Your physician recommends the following medication changes.  DECREASE: Imdur to 15 mg by mouth daily  *If you need a refill on your cardiac medications before your next appointment, please call your pharmacy*   Lab Work: Your provider would like for you to return in 3 months to have the following labs drawn: (Lipid).   Please go to the [facility] entrance and check in at the front desk.  You do not need an appointment.  They are open from 7am-6 pm.  You will need to be fasting.    Testing/Procedures: None ordered today   Follow-Up: At [facility], you and your health needs are our priority.  As part of our continuing mission to provide you with exceptional heart care, we have created designated Provider Care Teams.  These Care Teams include your primary Cardiologist (physician) and Advanced Practice Providers (APPs -  Physician Assistants and Nurse Practitioners) who all work together to provide you with the care you need, when you need it.  We recommend signing up for the patient portal called "MyChart".  Sign up information is provided on this After Visit Summary.  MyChart is used to connect with patients for Virtual Visits (Telemedicine).  Patients are able to view lab/test results, encounter notes, upcoming appointments, etc.  Non-urgent messages can be sent to your provider as well.   To learn more about what you can do with MyChart, go to [email or web address].    Your next appointment:   3 month(s)  Provider:   Debbe Odea, MD

## 2022-10-03 ENCOUNTER — Other Ambulatory Visit: Payer: Self-pay | Admitting: Family Medicine

## 2022-10-03 DIAGNOSIS — F325 Major depressive disorder, single episode, in full remission: Secondary | ICD-10-CM

## 2022-10-11 ENCOUNTER — Other Ambulatory Visit: Payer: Self-pay

## 2022-10-11 DIAGNOSIS — F325 Major depressive disorder, single episode, in full remission: Secondary | ICD-10-CM

## 2022-10-11 MED ORDER — AMPHETAMINE-DEXTROAMPHETAMINE 10 MG PO TABS: 10.0000 mg | ORAL_TABLET | Freq: Two times a day (BID) | ORAL | 0 refills | Status: DC

## 2022-10-11 NOTE — Telephone Encounter (Signed)
Pt needs refil on his adderell to be sent to [facility]

## 2022-11-02 NOTE — Progress Notes (Unsigned)
Name: Gregory Matthews   MRN: [id number]    DOB: 04/11/1981   Date:11/03/2022       Progress Note  Subjective  Chief Complaint  Follow Up  HPI  MDD/GAD:diagnosed 05/2019  he is getting marital counseling occasionally now that they are doing much better, currently only on prn visits to his therapist. His Phq 9 has been normal .He is off Depakote and Lexapro daily, plus Adderal for energy and focus occasionally takes it twice a day. Marland KitchenHe states weight loss has helped his mood, he is also going to switch jobs soon. 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  Insomnia: he has tried Hydroxizine , Trazodone and Temazepam but either had side effects of inefective currently on Quviviq and has been able to fall and stay asleep. It helps him fall and stay asleep   Low back pain: currently doing okay , he takes Skelaxin prn , medication is old but still has some at home   Migraine:We have tried Topamax and Imitrex. He has stopped both medication secondary to side effects. Topamax he stopped because of change in taste and Imitrex, because flushing sensation and chest tightness. Episodes of migraine are described as a dull  headache, followed by nausea and the pain intensifies and becomes throbbing or  pounding sensation in different parts of his head.  It is associated with photophobia, phonophobia and movement - such as walking  He stopped  Elavil due to side effects.He was on Depakote  for mood and migraine but he stopped medication . He has headaches due to Imdur but doing better on lower dose    Dyslipidemia: also negative cardiac calcium score    The 10-year ASCVD risk score (Arnett DK, et al., 2019) is: 2.1%   Values used to calculate the score:     Age: 42 years     Sex: Male     Is Non-Hispanic African American: No     Diabetic: No     Tobacco smoker: No     Systolic Blood Pressure: 122 mmHg     Is BP treated: No     HDL Cholesterol: 42 mg/dL     Total Cholesterol: 211 mg/dL     W09 deficiency: taking SL B12 a few times a week   Vitamin D deficiency: advised daily supplementation     Obesity:He has tried multiple diets. Tried Keto, personal trainers, [employer]. He joined a [employer] Loss program January 2024 , currently taking some herbal drops , he skips breakfast and follows a low carbohydrate diet .  His  initial weight was 237 lbs , it went down to 212 lbs in April and today is down to 201.6 lbs   Precordial pain : went to [facility] , he was seen by Dr. Myriam Forehand on 01/15 and cardiac calcium score was negative , echocardiogram  showed normal EF. He states episodes seems to be associated with tachycardia. He states episodes are very seldom now  described as dull and pressure, when he has a long episode - over 30 minutes - he has right arm pain and tingling on right hand. He is now taking Imdur but only half a pill due to side effects. Episodes about twice in two weeks and usually at night   Patient Active Problem List   Diagnosis Date Noted   Major depression in remission (HCC) 04/14/2021   B12 deficiency 04/14/2021   Vitamin D deficiency 04/14/2021   GAD (generalized anxiety disorder)  04/14/2021   Headache, migraine 06/08/2015   Obesity (BMI 30.0-34.9) 06/08/2015   Allergic rhinitis, seasonal 07/26/2013    Past Surgical History:  Procedure Laterality Date   HAIR TRANSPLANT     RHINOPLASTY     TONSILLECTOMY      Family History  Problem Relation Age of Onset   Cancer Mother        Breast   Hypertension Father    Heart disease Father    Heart attack Father    Cancer Maternal Aunt        Breast   Hyperlipidemia Maternal Grandmother    Heart attack Maternal Grandfather    Heart attack Paternal Grandfather    Parkinson's disease Paternal Grandfather     Social History   Tobacco Use   Smoking status: Never    Passive exposure: Past   Smokeless tobacco: Never  Substance Use Topics   Alcohol use: No    Alcohol/week: 0.0 standard  drinks of alcohol     Current Outpatient Medications:    isosorbide mononitrate (IMDUR) 30 MG 24 hr tablet, Take 0.5 tablets (15 mg total) by mouth daily., Disp: 45 tablet, Rfl: 3   amphetamine-dextroamphetamine (ADDERALL) 10 MG tablet, Take 1 tablet (10 mg total) by mouth 2 (two) times daily., Disp: 180 tablet, Rfl: 0   Daridorexant HCl (QUVIVIQ) 50 MG TABS, Take 1 tablet (50 mg total) by mouth at bedtime. For sleep, Disp: 30 tablet, Rfl: 4   escitalopram (LEXAPRO) 10 MG tablet, Take 1 tablet (10 mg total) by mouth daily., Disp: 90 tablet, Rfl: 0  Allergies  Allergen Reactions   Elavil [Amitriptyline] Anxiety   Penicillins Rash    I personally reviewed active problem list, medication list, allergies, family history, social history, health maintenance with the patient/caregiver today.   ROS  Constitutional: Negative for fever , positive for  weight change.  Respiratory: Negative for cough and shortness of breath.   Cardiovascular: Negative for chest pain or palpitations.  Gastrointestinal: Negative for abdominal pain, no bowel changes.  Musculoskeletal: Negative for gait problem or joint swelling.  Skin: Negative for rash.  Neurological: Negative for dizziness or headache.  No other specific complaints in a complete review of systems (except as listed in HPI above).   Objective  Vitals:   11/03/22 0901  BP: 122/78  Pulse: 66  Resp: 16  Temp: 97.7 F (36.5 C)  TempSrc: Oral  SpO2: 99%  Weight: 201 lb 9.6 oz (91.4 kg)  Height: 5\' 10"  (1.778 m)    Body mass index is 28.93 kg/m.  Physical Exam  Constitutional: Patient appears well-developed and well-nourished.  No distress.  HEENT: head atraumatic, normocephalic, pupils equal and reactive to light, neck supple Cardiovascular: Normal rate, regular rhythm and normal heart sounds.  No murmur heard. No BLE edema. Pulmonary/Chest: Effort normal and breath sounds normal. No respiratory distress. Abdominal: Soft.  There is  no tenderness. Psychiatric: Patient has a normal mood and affect. behavior is normal. Judgment and thought content normal.    PHQ2/9:    11/03/2022    9:03 AM 07/28/2022   10:17 AM 05/25/2022    3:06 PM 04/14/2022    9:26 AM 03/31/2022   11:26 AM  Depression screen PHQ 2/9  Decreased Interest 0 0 0 0 0  Down, Depressed, Hopeless 0 1 0 1 0  PHQ - 2 Score 0 1 0 1 0  Altered sleeping 2 3 1 1 1   Tired, decreased energy 0 0 0 0 1  Change  in appetite 0 0 0 0 1  Feeling bad or failure about yourself  0 0 0 0 0  Trouble concentrating 0 0 0 0 0  Moving slowly or fidgety/restless  0 0 0 0  Suicidal thoughts 0 0 0 0 0  PHQ-9 Score 2 4 1 2 3   Difficult doing work/chores Not difficult at all        phq 9 is negative   Fall Risk:    11/03/2022    9:03 AM 07/28/2022   10:17 AM 05/25/2022    3:06 PM 04/14/2022    9:20 AM 03/31/2022   11:26 AM  Fall Risk   Falls in the past year? 0 0 0 0 0  Number falls in past yr:  0 0 0 0  Injury with Fall?  0 0 0 0  Risk for fall due to : No Fall Risks No Fall Risks No Fall Risks No Fall Risks No Fall Risks  Follow up Falls prevention discussed Falls prevention discussed Falls prevention discussed Falls prevention discussed Falls prevention discussed      Functional Status Survey: Is the patient deaf or have difficulty hearing?: No Does the patient have difficulty seeing, even when wearing glasses/contacts?: No Does the patient have difficulty concentrating, remembering, or making decisions?: No Does the patient have difficulty walking or climbing stairs?: No Does the patient have difficulty dressing or bathing?: No Does the patient have difficulty doing errands alone such as visiting a doctor's office or shopping?: No    Assessment & Plan  1. Major depression in remission (HCC)  - escitalopram (LEXAPRO) 10 MG tablet; Take 1 tablet (10 mg total) by mouth daily.  Dispense: 90 tablet; Refill: 0 - amphetamine-dextroamphetamine (ADDERALL) 10 MG  tablet; Take 1 tablet (10 mg total) by mouth 2 (two) times daily.  Dispense: 180 tablet; Refill: 0  2. Psychophysiological insomnia  - Daridorexant HCl (QUVIVIQ) 50 MG TABS; Take 1 tablet (50 mg total) by mouth at bedtime. For sleep  Dispense: 30 tablet; Refill: 4  3. Precordial chest pain  Under the care of Dr. Myriam Forehand   4. Migraine without aura and without status migrainosus, not intractable  Stable  5. Dyslipidemia  - Lipid panel  6. B12 deficiency  - CBC with Differential/Platelet - B12 and Folate Panel  7. Vitamin D deficiency  - VITAMIN D 25 Hydroxy (Vit-D Deficiency, Fractures)  8. Overweight (BMI 25.0-29.9)  Doing well   9. Diabetes mellitus screening  - Hemoglobin A1c - Insulin, Free (Bioactive)  10. Long-term use of high-risk medication  - COMPLETE METABOLIC PANEL WITH GFR

## 2022-11-03 ENCOUNTER — Encounter: Payer: Self-pay | Admitting: Family Medicine

## 2022-11-03 ENCOUNTER — Ambulatory Visit: Payer: BC Managed Care – PPO | Admitting: Family Medicine

## 2022-11-03 VITALS — BP 122/78 | HR 66 | Temp 97.7°F | Resp 16 | Ht 70.0 in | Wt 201.6 lb

## 2022-11-03 DIAGNOSIS — E559 Vitamin D deficiency, unspecified: Secondary | ICD-10-CM

## 2022-11-03 DIAGNOSIS — Z131 Encounter for screening for diabetes mellitus: Secondary | ICD-10-CM

## 2022-11-03 DIAGNOSIS — F325 Major depressive disorder, single episode, in full remission: Secondary | ICD-10-CM | POA: Diagnosis not present

## 2022-11-03 DIAGNOSIS — G43009 Migraine without aura, not intractable, without status migrainosus: Secondary | ICD-10-CM

## 2022-11-03 DIAGNOSIS — R072 Precordial pain: Secondary | ICD-10-CM

## 2022-11-03 DIAGNOSIS — E785 Hyperlipidemia, unspecified: Secondary | ICD-10-CM

## 2022-11-03 DIAGNOSIS — Z79899 Other long term (current) drug therapy: Secondary | ICD-10-CM

## 2022-11-03 DIAGNOSIS — E663 Overweight: Secondary | ICD-10-CM

## 2022-11-03 DIAGNOSIS — F5104 Psychophysiologic insomnia: Secondary | ICD-10-CM

## 2022-11-03 DIAGNOSIS — E538 Deficiency of other specified B group vitamins: Secondary | ICD-10-CM

## 2022-11-03 MED ORDER — ESCITALOPRAM OXALATE 10 MG PO TABS
10.0000 mg | ORAL_TABLET | Freq: Every day | ORAL | 0 refills | Status: DC
Start: 2022-11-03 — End: 2023-02-03

## 2022-11-03 MED ORDER — AMPHETAMINE-DEXTROAMPHETAMINE 10 MG PO TABS
10.0000 mg | ORAL_TABLET | Freq: Two times a day (BID) | ORAL | 0 refills | Status: DC
Start: 2022-11-03 — End: 2023-02-03

## 2022-11-03 MED ORDER — QUVIVIQ 50 MG PO TABS
1.0000 | ORAL_TABLET | Freq: Every evening | ORAL | 4 refills | Status: DC
Start: 2022-11-03 — End: 2023-02-03

## 2022-11-15 LAB — COMPLETE METABOLIC PANEL WITH GFR
AG Ratio: 1.7 (calc) (ref 1.0–2.5)
ALT: 38 U/L (ref 9–46)
AST: 22 U/L (ref 10–40)
Albumin: 4.7 g/dL (ref 3.6–5.1)
Alkaline phosphatase (APISO): 48 U/L (ref 36–130)
BUN: 21 mg/dL (ref 7–25)
CO2: 30 mmol/L (ref 20–32)
Calcium: 9.4 mg/dL (ref 8.6–10.3)
Chloride: 102 mmol/L (ref 98–110)
Creat: 0.92 mg/dL (ref 0.60–1.29)
Globulin: 2.8 g/dL (calc) (ref 1.9–3.7)
Glucose, Bld: 88 mg/dL (ref 65–99)
Potassium: 4.8 mmol/L (ref 3.5–5.3)
Sodium: 138 mmol/L (ref 135–146)
Total Bilirubin: 0.4 mg/dL (ref 0.2–1.2)
Total Protein: 7.5 g/dL (ref 6.1–8.1)
eGFR: 106 mL/min/{1.73_m2} (ref >=60)

## 2022-11-15 LAB — CBC WITH DIFFERENTIAL/PLATELET
Absolute Monocytes: 511 cells/uL (ref 200–950)
Basophils Absolute: 29 cells/uL (ref 0–200)
Basophils Relative: 0.4 %
Eosinophils Absolute: 130 cells/uL (ref 15–500)
Eosinophils Relative: 1.8 %
HCT: 46.7 % (ref 38.5–50.0)
Hemoglobin: 15.9 g/dL (ref 13.2–17.1)
Lymphs Abs: 2189 cells/uL (ref 850–3900)
MCH: 30.3 pg (ref 27.0–33.0)
MCHC: 34 g/dL (ref 32.0–36.0)
MCV: 89 fL (ref 80.0–100.0)
MPV: 8.3 fL (ref 7.5–12.5)
Monocytes Relative: 7.1 %
Neutro Abs: 4342 cells/uL (ref 1500–7800)
Neutrophils Relative %: 60.3 %
Platelets: 351 10*3/uL (ref 140–400)
RBC: 5.25 10*6/uL (ref 4.20–5.80)
RDW: 12.3 % (ref 11.0–15.0)
Total Lymphocyte: 30.4 %
WBC: 7.2 10*3/uL (ref 3.8–10.8)

## 2022-11-15 LAB — LIPID PANEL
Cholesterol: 223 mg/dL — ABNORMAL HIGH (ref <200)
HDL: 51 mg/dL (ref >=40)
LDL Cholesterol (Calc): 151 mg/dL (calc) — ABNORMAL HIGH
Non-HDL Cholesterol (Calc): 172 mg/dL (calc) — ABNORMAL HIGH (ref <130)
Total CHOL/HDL Ratio: 4.4 (calc) (ref <5.0)
Triglycerides: 100 mg/dL (ref <150)

## 2022-11-15 LAB — HEMOGLOBIN A1C
Hgb A1c MFr Bld: 5.3 % of total Hgb (ref <5.7)
Mean Plasma Glucose: 105 mg/dL
eAG (mmol/L): 5.8 mmol/L

## 2022-11-15 LAB — VITAMIN D 25 HYDROXY (VIT D DEFICIENCY, FRACTURES): Vit D, 25-Hydroxy: 41 ng/mL (ref 30–100)

## 2022-11-15 LAB — B12 AND FOLATE PANEL
Folate: 15.1 ng/mL
Vitamin B-12: 316 pg/mL (ref 200–1100)

## 2022-11-15 LAB — INSULIN, FREE (BIOACTIVE): Insulin, Free: 10.4 u[IU]/mL (ref 1.5–14.9)

## 2022-11-24 ENCOUNTER — Encounter: Payer: BC Managed Care – PPO | Admitting: Family Medicine

## 2022-12-01 ENCOUNTER — Other Ambulatory Visit: Payer: Self-pay | Admitting: Family Medicine

## 2022-12-01 ENCOUNTER — Other Ambulatory Visit: Payer: Self-pay

## 2022-12-01 ENCOUNTER — Telehealth: Payer: Self-pay | Admitting: Family Medicine

## 2022-12-01 DIAGNOSIS — F5104 Psychophysiologic insomnia: Secondary | ICD-10-CM

## 2022-12-01 NOTE — Telephone Encounter (Signed)
Pt brought this new insurance card in and it has been added to his chart. Pt states he needs a refill on Quviviq to be sent to [facility]

## 2022-12-28 ENCOUNTER — Encounter: Payer: Self-pay | Admitting: Cardiology

## 2022-12-28 ENCOUNTER — Ambulatory Visit: Payer: BC Managed Care – PPO | Attending: Cardiology | Admitting: Cardiology

## 2022-12-28 VITALS — BP 110/72 | HR 65 | Ht 70.0 in | Wt 202.2 lb

## 2022-12-28 DIAGNOSIS — R072 Precordial pain: Secondary | ICD-10-CM

## 2022-12-28 DIAGNOSIS — E7800 Pure hypercholesterolemia, unspecified: Secondary | ICD-10-CM | POA: Diagnosis not present

## 2022-12-28 MED ORDER — METOPROLOL SUCCINATE ER 25 MG PO TB24
25.0000 mg | ORAL_TABLET | Freq: Every day | ORAL | 3 refills | Status: DC
Start: 2022-12-28 — End: 2023-01-19

## 2022-12-28 NOTE — Progress Notes (Signed)
Cardiology Office Note:    Date:  12/28/2022   ID:  Gregory Matthews, DOB 26-Jul-1980, MRN [id number]  PCP:  Alba Cory, MD   [facility] Providers Cardiologist:  Debbe Odea, MD     Referring MD: Alba Cory, MD   Chief Complaint  Patient presents with   Follow-up    Patient stopped taking Imdur due to causing headaches.  He feels that the Imdur did help relieve the chest pains.      History of Present Illness:    Gregory Matthews is a 42 y.o. male with a hx of migraines, hyperlipidemia, depression who presents for follow-up.  Last seen with symptoms of chest pain, started on Imdur 30 mg daily, he developed headaches, Imdur reduced to 15 mg.  Side effects of headache persisted, Imdur eventually stopped.  Showed improvement in chest pain with taking nitrates.  States resumption of occasional chest pain since stopping Imdur.  Denies any other concerns.  Prior notes Echo 05/2022 EF 60 to 65%, normal diastolic function Coronary CT 07/979 no CAD. Father had a heart attack with stent placement in his early 63s.   Past Medical History:  Diagnosis Date   Acute pharyngitis    Hay fever    Migraine    Sinusitis, acute     Past Surgical History:  Procedure Laterality Date   HAIR TRANSPLANT     RHINOPLASTY     TONSILLECTOMY      Current Medications: Current Meds  Medication Sig   amphetamine-dextroamphetamine (ADDERALL) 10 MG tablet Take 1 tablet (10 mg total) by mouth 2 (two) times daily.   Daridorexant HCl (QUVIVIQ) 50 MG TABS Take 1 tablet (50 mg total) by mouth at bedtime. For sleep   escitalopram (LEXAPRO) 10 MG tablet Take 1 tablet (10 mg total) by mouth daily.   metoprolol succinate (TOPROL-XL) 25 MG 24 hr tablet Take 1 tablet (25 mg total) by mouth daily. Take with or immediately following a meal.     Allergies:   Elavil [amitriptyline] and Penicillins   Social History   Socioeconomic History   Marital status: Married    Spouse name:  Morrie Sheldon    Number of children: 0   Years of education: Not on file   Highest education level: Bachelor's degree (e.g., BA, AB, BS)  Occupational History   Occupation: Runner, broadcasting/film/video   Tobacco Use   Smoking status: Never    Passive exposure: Past   Smokeless tobacco: Never  Vaping Use   Vaping status: Never Used  Substance and Sexual Activity   Alcohol use: No    Alcohol/week: 0.0 standard drinks of alcohol   Drug use: No   Sexual activity: Yes    Partners: Female  Other Topics Concern   Not on file  Social History Narrative   Married, he is a Runner, broadcasting/film/video   Social Determinants of Health   Financial Resource Strain: Low Risk  (08/11/2020)   Overall Financial Resource Strain (CARDIA)    Difficulty of Paying Living Expenses: Not hard at all  Food Insecurity: No Food Insecurity (08/11/2020)   Hunger Vital Sign    Worried About Running Out of Food in the Last Year: Never true    Ran Out of Food in the Last Year: Never true  Transportation Needs: No Transportation Needs (08/11/2020)   PRAPARE - Administrator, Civil Service (Medical): No    Lack of Transportation (Non-Medical): No  Physical Activity: Insufficiently Active (08/11/2020)   Exercise Vital Sign  Days of Exercise per Week: 2 days    Minutes of Exercise per Session: 30 min  Stress: Stress Concern Present (08/11/2020)   [employer] of Occupational Health - Occupational Stress Questionnaire    Feeling of Stress : To some extent  Social Connections: Socially Integrated (08/11/2020)   Social Connection and Isolation Panel [NHANES]    Frequency of Communication with Friends and Family: More than three times a week    Frequency of Social Gatherings with Friends and Family: More than three times a week    Attends Religious Services: More than 4 times per year    Active Member of [employer] or Organizations: Yes    Attends Engineer, structural: More than 4 times per year    Marital Status: Married     Family  History: The patient's family history includes Cancer in his maternal aunt and mother; Heart attack in his father, maternal grandfather, and paternal grandfather; Heart disease in his father; Hyperlipidemia in his maternal grandmother; Hypertension in his father; Parkinson's disease in his paternal grandfather.  ROS:   Please see the history of present illness.     All other systems reviewed and are negative.  EKGs/Labs/Other Studies Reviewed:    The following studies were reviewed today:   EKG:  EKG not  ordered today.  Recent Labs: 11/03/2022: ALT 38; BUN 21; Creat 0.92; Hemoglobin 15.9; Platelets 351; Potassium 4.8; Sodium 138  Recent Lipid Panel    Component Value Date/Time   CHOL 223 (H) 11/03/2022 0945   TRIG 100 11/03/2022 0945   HDL 51 11/03/2022 0945   CHOLHDL 4.4 11/03/2022 0945   VLDL 21 12/08/2015 1003   LDLCALC 151 (H) 11/03/2022 0945     Risk Assessment/Calculations:             Physical Exam:    VS:  BP 110/72 (BP Location: Left Arm, Patient Position: Sitting, Cuff Size: Large)   Pulse 65   Ht 5\' 10"  (1.778 m)   Wt 202 lb 3.2 oz (91.7 kg)   SpO2 99%   BMI 29.01 kg/m     Wt Readings from Last 3 Encounters:  12/28/22 202 lb 3.2 oz (91.7 kg)  11/03/22 201 lb 9.6 oz (91.4 kg)  09/23/22 203 lb 3.2 oz (92.2 kg)     GEN:  Well nourished, well developed in no acute distress HEENT: Normal NECK: No JVD; No carotid bruits CARDIAC: RRR, no murmurs, rubs, gallops RESPIRATORY:  Clear to auscultation without rales, wheezing or rhonchi  ABDOMEN: Soft, non-tender, non-distended MUSCULOSKELETAL:  No edema; No deformity  SKIN: Warm and dry NEUROLOGIC:  Alert and oriented x 3 PSYCHIATRIC:  Normal affect   ASSESSMENT:    1. Precordial pain   2. Pure hypercholesterolemia    PLAN:    In order of problems listed above:  Chest pain, echo with normal EF, coronary CT with no CAD.  Improvement in chest pain with nitrates.  Did not tolerate Imdur.  Start  Toprol-XL 25 mg daily for antianginal benefit. Hyperlipidemia, not in statin benefit group, low-cholesterol diet advised.  Follow-up in 3 months.     Medication Adjustments/Labs and Tests Ordered: Current medicines are reviewed at length with the patient today.  Concerns regarding medicines are outlined above.  No orders of the defined types were placed in this encounter.  Meds ordered this encounter  Medications   metoprolol succinate (TOPROL-XL) 25 MG 24 hr tablet    Sig: Take 1 tablet (25 mg total) by mouth daily.  Take with or immediately following a meal.    Dispense:  90 tablet    Refill:  3    Patient Instructions  Medication Instructions:  Your physician recommends the following medication changes.  START TAKING: Metoprolol Succinate 25 mg by mouth daily   *If you need a refill on your cardiac medications before your next appointment, please call your pharmacy*   Lab Work: No labs ordered today    Testing/Procedures: No test ordered today    Follow-Up: At [facility], you and your health needs are our priority.  As part of our continuing mission to provide you with exceptional heart care, we have created designated Provider Care Teams.  These Care Teams include your primary Cardiologist (physician) and Advanced Practice Providers (APPs -  Physician Assistants and Nurse Practitioners) who all work together to provide you with the care you need, when you need it.  We recommend signing up for the patient portal called "MyChart".  Sign up information is provided on this After Visit Summary.  MyChart is used to connect with patients for Virtual Visits (Telemedicine).  Patients are able to view lab/test results, encounter notes, upcoming appointments, etc.  Non-urgent messages can be sent to your provider as well.   To learn more about what you can do with MyChart, go to [email or web address].    Your next appointment:   3 week(s)  Provider:   You may see  Debbe Odea, MD or one of the following Advanced Practice Providers on your designated Care Team:   Nicolasa Ducking, NP Eula Listen, PA-C Cadence Fransico Michael, PA-C Charlsie Quest, NP      Signed, Debbe Odea, MD  12/28/2022 9:37 AM    [facility]

## 2022-12-28 NOTE — Progress Notes (Unsigned)
Name: Gregory Matthews   MRN: [id number]    DOB: 1981/03/10   Date:12/29/2022       Progress Note  Subjective  Chief Complaint  Annual Exam  HPI  Patient presents for annual CPE.  IPSS Questionnaire (AUA-7): Over the past month.   1)  How often have you had a sensation of not emptying your bladder completely after you finish urinating?  0 - Not at all  2)  How often have you had to urinate again less than two hours after you finished urinating? 0 - Not at all  3)  How often have you found you stopped and started again several times when you urinated?  0 - Not at all  4) How difficult have you found it to postpone urination?  0 - Not at all  5) How often have you had a weak urinary stream?  0 - Not at all  6) How often have you had to push or strain to begin urination?  0 - Not at all  7) How many times did you most typically get up to urinate from the time you went to bed until the time you got up in the morning?  1 - 1 time  Total score:  0-7 mildly symptomatic   8-19 moderately symptomatic   20-35 severely symptomatic     Diet: balanced, cooking at home  Exercise: discussed 150 minutes per week Last Dental Exam: up to date  Last Eye Exam: up to date   Depression: phq 9 is negative    12/29/2022   11:52 AM 11/03/2022    9:03 AM 07/28/2022   10:17 AM 05/25/2022    3:06 PM 04/14/2022    9:26 AM  Depression screen PHQ 2/9  Decreased Interest 0 0 0 0 0  Down, Depressed, Hopeless 0 0 1 0 1  PHQ - 2 Score 0 0 1 0 1  Altered sleeping 1 2 3 1 1   Tired, decreased energy 0 0 0 0 0  Change in appetite 0 0 0 0 0  Feeling bad or failure about yourself  0 0 0 0 0  Trouble concentrating 0 0 0 0 0  Moving slowly or fidgety/restless 0  0 0 0  Suicidal thoughts 0 0 0 0 0  PHQ-9 Score 1 2 4 1 2   Difficult doing work/chores  Not difficult at all       Hypertension:  BP Readings from Last 3 Encounters:  12/29/22 128/72  12/28/22 110/72  11/03/22 122/78    Obesity: Wt Readings from  Last 3 Encounters:  12/29/22 199 lb (90.3 kg)  12/28/22 202 lb 3.2 oz (91.7 kg)  11/03/22 201 lb 9.6 oz (91.4 kg)   BMI Readings from Last 3 Encounters:  12/29/22 28.55 kg/m  12/28/22 29.01 kg/m  11/03/22 28.93 kg/m     Lipids:  Lab Results  Component Value Date   CHOL 223 (H) 11/03/2022   CHOL 211 (H) 10/12/2021   CHOL 202 (H) 08/11/2020   Lab Results  Component Value Date   HDL 51 11/03/2022   HDL 42 10/12/2021   HDL 40 08/11/2020   Lab Results  Component Value Date   LDLCALC 151 (H) 11/03/2022   LDLCALC 129 (H) 10/12/2021   LDLCALC 127 (H) 08/11/2020   Lab Results  Component Value Date   TRIG 100 11/03/2022   TRIG 245 (H) 10/12/2021   TRIG 208 (H) 08/11/2020   Lab Results  Component Value Date   CHOLHDL  4.4 11/03/2022   CHOLHDL 5.0 (H) 10/12/2021   CHOLHDL 5.1 (H) 08/11/2020   No results found for: "LDLDIRECT" Glucose:  Glucose  Date Value Ref Range Status  06/25/2012 138 (H) 65 - 99 mg/dL Final   Glucose, Bld  Date Value Ref Range Status  11/03/2022 88 65 - 99 mg/dL Final    Comment:    .            Fasting reference interval .   05/08/2022 99 70 - 99 mg/dL Final    Comment:    Glucose reference range applies only to samples taken after fasting for at least 8 hours.  04/26/2022 107 (H) 70 - 99 mg/dL Final    Comment:    Glucose reference range applies only to samples taken after fasting for at least 8 hours.    Flowsheet Row Video Visit from 04/02/2021 in [facility]  AUDIT-C Score 0       Married STD testing and prevention (HIV/chl/gon/syphilis): 08/11/20 Sexual history: one sexual partner, married, doing better since he started to lose weight , erection has improved and able to sustain  Hep C Screening: 04/27/18 Skin cancer: Discussed monitoring for atypical lesions Colorectal cancer: N/A Prostate cancer:  N/A   Lung cancer:  Low Dose CT Chest recommended if Age 14-80 years, 30 pack-year currently smoking  OR have quit w/in 15years. Patient is not  a candidate for screening   AAA: The USPSTF recommends one-time screening with ultrasonography in men ages 28 to 75 years who have ever smoked. Patient is not  a candidate for screening  ECG:  05/09/22  Vaccines:   HPV: N/A Tdap: up to date Shingrix: N/A  Pneumonia: N/A Flu: 2023, he will return in October  COVID-19: up to date  Advanced Care Planning: A voluntary discussion about advance care planning including the explanation and discussion of advance directives.  Discussed health care proxy and Living will, and the patient was able to identify a health care proxy as wife.  Patient does not have a living will and power of attorney of health care   Patient Active Problem List   Diagnosis Date Noted   Major depression in remission (HCC) 04/14/2021   B12 deficiency 04/14/2021   Vitamin D deficiency 04/14/2021   GAD (generalized anxiety disorder) 04/14/2021   Headache, migraine 06/08/2015   Obesity (BMI 30.0-34.9) 06/08/2015   Allergic rhinitis, seasonal 07/26/2013    Past Surgical History:  Procedure Laterality Date   HAIR TRANSPLANT     RHINOPLASTY     TONSILLECTOMY      Family History  Problem Relation Age of Onset   Cancer Mother        Breast   Hypertension Father    Heart disease Father    Heart attack Father    Cancer Maternal Aunt        Breast   Hyperlipidemia Maternal Grandmother    Heart attack Maternal Grandfather    Heart attack Paternal Grandfather    Parkinson's disease Paternal Grandfather     Social History   Socioeconomic History   Marital status: Married    Spouse name: Morrie Sheldon    Number of children: 0   Years of education: Not on file   Highest education level: Bachelor's degree (e.g., BA, AB, BS)  Occupational History   Occupation: teacher   Tobacco Use   Smoking status: Never    Passive exposure: Past   Smokeless tobacco: Never  Vaping Use  Vaping status: Never Used  Substance and Sexual  Activity   Alcohol use: No    Alcohol/week: 0.0 standard drinks of alcohol   Drug use: No   Sexual activity: Yes    Partners: Female  Other Topics Concern   Not on file  Social History Narrative   Married, he is a Runner, broadcasting/film/video   Social Determinants of Health   Financial Resource Strain: Low Risk  (12/29/2022)   Overall Financial Resource Strain (CARDIA)    Difficulty of Paying Living Expenses: Not hard at all  Food Insecurity: No Food Insecurity (12/29/2022)   Hunger Vital Sign    Worried About Running Out of Food in the Last Year: Never true    Ran Out of Food in the Last Year: Never true  Transportation Needs: No Transportation Needs (12/29/2022)   PRAPARE - Administrator, Civil Service (Medical): No    Lack of Transportation (Non-Medical): No  Physical Activity: Insufficiently Active (12/29/2022)   Exercise Vital Sign    Days of Exercise per Week: 2 days    Minutes of Exercise per Session: 20 min  Stress: No Stress Concern Present (12/29/2022)   [employer] of Occupational Health - Occupational Stress Questionnaire    Feeling of Stress : Only a little  Social Connections: Socially Integrated (12/29/2022)   Social Connection and Isolation Panel [NHANES]    Frequency of Communication with Friends and Family: More than three times a week    Frequency of Social Gatherings with Friends and Family: More than three times a week    Attends Religious Services: More than 4 times per year    Active Member of [employer] or Organizations: Yes    Attends Engineer, structural: More than 4 times per year    Marital Status: Married  Catering manager Violence: Not At Risk (12/29/2022)   Humiliation, Afraid, Rape, and Kick questionnaire    Fear of Current or Ex-Partner: No    Emotionally Abused: No    Physically Abused: No    Sexually Abused: No     Current Outpatient Medications:    amphetamine-dextroamphetamine (ADDERALL) 10 MG tablet, Take 1 tablet (10 mg total) by mouth 2  (two) times daily., Disp: 180 tablet, Rfl: 0   Daridorexant HCl (QUVIVIQ) 50 MG TABS, Take 1 tablet (50 mg total) by mouth at bedtime. For sleep, Disp: 30 tablet, Rfl: 4   escitalopram (LEXAPRO) 10 MG tablet, Take 1 tablet (10 mg total) by mouth daily., Disp: 90 tablet, Rfl: 0   metoprolol succinate (TOPROL-XL) 25 MG 24 hr tablet, Take 1 tablet (25 mg total) by mouth daily. Take with or immediately following a meal., Disp: 90 tablet, Rfl: 3  Allergies  Allergen Reactions   Elavil [Amitriptyline] Anxiety   Penicillins Rash     ROS  Constitutional: Negative for fever or weight change.  Respiratory: Negative for cough and shortness of breath.   Cardiovascular: Negative for chest pain or palpitations.  Gastrointestinal: Negative for abdominal pain, no bowel changes.  Musculoskeletal: Negative for gait problem or joint swelling.  Skin: Negative for rash.  Neurological: Negative for dizziness or headache.  No other specific complaints in a complete review of systems (except as listed in HPI above).    Objective  Vitals:   12/29/22 1145  BP: 128/72  Pulse: 80  Resp: 16  SpO2: 98%  Weight: 199 lb (90.3 kg)  Height: 5\' 10"  (1.778 m)    Body mass index is 28.55 kg/m.  Physical Exam  Constitutional: Patient appears well-developed and well-nourished. No distress.  HENT: Head: Normocephalic and atraumatic. Ears: B TMs ok, no erythema or effusion; Nose: Nose normal. Mouth/Throat: Oropharynx is clear and moist. No oropharyngeal exudate.  Eyes: Conjunctivae and EOM are normal. Pupils are equal, round, and reactive to light. No scleral icterus.  Neck: Normal range of motion. Neck supple. No JVD present. No thyromegaly present.  Cardiovascular: Normal rate, regular rhythm and normal heart sounds.  No murmur heard. No BLE edema. Pulmonary/Chest: Effort normal and breath sounds normal. No respiratory distress. Abdominal: Soft. Bowel sounds are normal, no distension. There is no tenderness.  no masses MALE GENITALIA: Normal descended testes bilaterally, no masses palpated, no hernias, no lesions, no discharge RECTAL: Prostate normal size and consistency, no rectal masses or hemorrhoids Musculoskeletal: Normal range of motion, no joint effusions. No gross deformities Neurological: he is alert and oriented to person, place, and time. No cranial nerve deficit. Coordination, balance, strength, speech and gait are normal.  Skin: Skin is warm and dry. No rash noted. No erythema.  Psychiatric: Patient has a normal mood and affect. behavior is normal. Judgment and thought content normal.    Recent Results (from the past 2160 hour(s))  Lipid panel     Status: Abnormal   Collection Time: 11/03/22  9:45 AM  Result Value Ref Range   Cholesterol 223 (H) <200 mg/dL   HDL 51 > OR = 40 mg/dL   Triglycerides 213 <086 mg/dL   LDL Cholesterol (Calc) 151 (H) mg/dL (calc)    Comment: Reference range: <100 . Desirable range <100 mg/dL for primary prevention;   <70 mg/dL for patients with CHD or diabetic patients  with > or = 2 CHD risk factors. Marland Kitchen LDL-C is now calculated using the Martin-Hopkins  calculation, which is a validated novel method providing  better accuracy than the Friedewald equation in the  estimation of LDL-C.  Horald Pollen et al. Lenox Ahr. 5784;696(29): 2061-2068  (http://education.[facility].com/faq/FAQ164)    Total CHOL/HDL Ratio 4.4 <5.0 (calc)   Non-HDL Cholesterol (Calc) 172 (H) <130 mg/dL (calc)    Comment: For patients with diabetes plus 1 major ASCVD risk  factor, treating to a non-HDL-C goal of <100 mg/dL  (LDL-C of <52 mg/dL) is considered a therapeutic  option.   CBC with Differential/Platelet     Status: None   Collection Time: 11/03/22  9:45 AM  Result Value Ref Range   WBC 7.2 3.8 - 10.8 Thousand/uL   RBC 5.25 4.20 - 5.80 Million/uL   Hemoglobin 15.9 13.2 - 17.1 g/dL   HCT 84.1 32.4 - 40.1 %   MCV 89.0 80.0 - 100.0 fL   MCH 30.3 27.0 - 33.0 pg   MCHC  34.0 32.0 - 36.0 g/dL   RDW 02.7 25.3 - 66.4 %   Platelets 351 140 - 400 Thousand/uL   MPV 8.3 7.5 - 12.5 fL   Neutro Abs 4,342 1,500 - 7,800 cells/uL   Lymphs Abs 2,189 850 - 3,900 cells/uL   Absolute Monocytes 511 200 - 950 cells/uL   Eosinophils Absolute 130 15 - 500 cells/uL   Basophils Absolute 29 0 - 200 cells/uL   Neutrophils Relative % 60.3 %   Total Lymphocyte 30.4 %   Monocytes Relative 7.1 %   Eosinophils Relative 1.8 %   Basophils Relative 0.4 %  COMPLETE METABOLIC PANEL WITH GFR     Status: None   Collection Time: 11/03/22  9:45 AM  Result Value Ref Range   Glucose, Bld 88 65 -  99 mg/dL    Comment: .            Fasting reference interval .    BUN 21 7 - 25 mg/dL   Creat 9.56 2.13 - 0.86 mg/dL   eGFR 578 > OR = 60 IO/NGE/9.52W4   BUN/Creatinine Ratio SEE NOTE: 6 - 22 (calc)    Comment:    Not Reported: BUN and Creatinine are within    reference range. .    Sodium 138 135 - 146 mmol/L   Potassium 4.8 3.5 - 5.3 mmol/L   Chloride 102 98 - 110 mmol/L   CO2 30 20 - 32 mmol/L   Calcium 9.4 8.6 - 10.3 mg/dL   Total Protein 7.5 6.1 - 8.1 g/dL   Albumin 4.7 3.6 - 5.1 g/dL   Globulin 2.8 1.9 - 3.7 g/dL (calc)   AG Ratio 1.7 1.0 - 2.5 (calc)   Total Bilirubin 0.4 0.2 - 1.2 mg/dL   Alkaline phosphatase (APISO) 48 36 - 130 U/L   AST 22 10 - 40 U/L   ALT 38 9 - 46 U/L  B12 and Folate Panel     Status: None   Collection Time: 11/03/22  9:45 AM  Result Value Ref Range   Vitamin B-12 316 200 - 1,100 pg/mL    Comment: . Please Note: Although the reference range for vitamin B12 is [phone] pg/mL, it has been reported that between 5 and 10% of patients with values between 200 and 400 pg/mL may experience neuropsychiatric and hematologic abnormalities due to occult B12 deficiency; less than 1% of patients with values above 400 pg/mL will have symptoms. .    Folate 15.1 ng/mL    Comment:                            Reference Range                            Low:            <3.4                            Borderline:    3.4-5.4                            Normal:        >5.4 .   VITAMIN D 25 Hydroxy (Vit-D Deficiency, Fractures)     Status: None   Collection Time: 11/03/22  9:45 AM  Result Value Ref Range   Vit D, 25-Hydroxy 41 30 - 100 ng/mL    Comment: Vitamin D Status         25-OH Vitamin D: . Deficiency:                    <20 ng/mL Insufficiency:             20 - 29 ng/mL Optimal:                 > or = 30 ng/mL . For 25-OH Vitamin D testing on patients on  D2-supplementation and patients for whom quantitation  of D2 and D3 fractions is required, the [facility]ureD(TM) 25-OH VIT D, (D2,D3), LC/MS/MS is recommended: order  code 13244 (patients >16yrs). . See Note 1 . Note 1 . For additional information, please refer  to  http://education.[facility].com/faq/FAQ199  (This link is being provided for informational/ educational purposes only.)   Hemoglobin A1c     Status: None   Collection Time: 11/03/22  9:45 AM  Result Value Ref Range   Hgb A1c MFr Bld 5.3 <5.7 % of total Hgb    Comment: For the purpose of screening for the presence of diabetes: . <5.7%       Consistent with the absence of diabetes 5.7-6.4%    Consistent with increased risk for diabetes             (prediabetes) > or =6.5%  Consistent with diabetes . This assay result is consistent with a decreased risk of diabetes. . Currently, no consensus exists regarding use of hemoglobin A1c for diagnosis of diabetes in children. . According to American Diabetes Association (ADA) guidelines, hemoglobin A1c <7.0% represents optimal control in non-pregnant diabetic patients. Different metrics may apply to specific patient populations.  Standards of Medical Care in Diabetes(ADA). .    Mean Plasma Glucose 105 mg/dL   eAG (mmol/L) 5.8 mmol/L    Comment: . This test was performed on the Roche cobas c503 platform. Effective 01/31/22, a change in test platforms from  the Abbott Architect to the Roche cobas c503 may have shifted HbA1c results compared to historical results. Based on laboratory validation testing conducted at [facility], the Roche platform relative to the Abbott platform had an average increase in HbA1c value of < or = 0.3%. This difference is within accepted  variability established by the [facility]. Note that not all individuals will have had a shift in their results and direct comparisons between historical and current results for testing conducted on different platforms is not recommended.   Insulin, Free (Bioactive)     Status: None   Collection Time: 11/03/22  9:45 AM  Result Value Ref Range   Insulin, Free 10.4 1.5 - 14.9 uIU/mL    Comment: . Insulin levels vary widely in specimens taken from non-fasting individuals. . Insulin analogues may demonstrate non-linear cross-reactivity in this assay. Interpret results accordingly.      Fall Risk:    12/29/2022   11:44 AM 11/03/2022    9:03 AM 07/28/2022   10:17 AM 05/25/2022    3:06 PM 04/14/2022    9:20 AM  Fall Risk   Falls in the past year? 0 0 0 0 0  Number falls in past yr: 0  0 0 0  Injury with Fall? 0  0 0 0  Risk for fall due to : No Fall Risks No Fall Risks No Fall Risks No Fall Risks No Fall Risks  Follow up Falls prevention discussed Falls prevention discussed Falls prevention discussed Falls prevention discussed Falls prevention discussed     Functional Status Survey: Is the patient deaf or have difficulty hearing?: No Does the patient have difficulty seeing, even when wearing glasses/contacts?: No Does the patient have difficulty concentrating, remembering, or making decisions?: No Does the patient have difficulty walking or climbing stairs?: No Does the patient have difficulty dressing or bathing?: No Does the patient have difficulty doing errands alone such as visiting a doctor's office or shopping?:  No    Assessment & Plan  1. Well adult exam  Doing well , reviewed labs with patient today   -Prostate cancer screening and PSA options (with potential risks and benefits of testing vs not testing) were discussed along with recent recs/guidelines. -USPSTF grade A and B recommendations reviewed with patient; age-appropriate recommendations,  preventive care, screening tests, etc discussed and encouraged; healthy living encouraged; see AVS for patient education given to patient -Discussed importance of 150 minutes of physical activity weekly, eat two servings of fish weekly, eat one serving of tree nuts ( cashews, pistachios, pecans, almonds.Marland Kitchen) every other day, eat 6 servings of fruit/vegetables daily and drink plenty of water and avoid sweet beverages.  -Reviewed Health Maintenance: yes

## 2022-12-28 NOTE — Patient Instructions (Signed)
Medication Instructions:  Your physician recommends the following medication changes.  START TAKING: Metoprolol Succinate 25 mg by mouth daily   *If you need a refill on your cardiac medications before your next appointment, please call your pharmacy*   Lab Work: No labs ordered today    Testing/Procedures: No test ordered today    Follow-Up: At [facility], you and your health needs are our priority.  As part of our continuing mission to provide you with exceptional heart care, we have created designated Provider Care Teams.  These Care Teams include your primary Cardiologist (physician) and Advanced Practice Providers (APPs -  Physician Assistants and Nurse Practitioners) who all work together to provide you with the care you need, when you need it.  We recommend signing up for the patient portal called "MyChart".  Sign up information is provided on this After Visit Summary.  MyChart is used to connect with patients for Virtual Visits (Telemedicine).  Patients are able to view lab/test results, encounter notes, upcoming appointments, etc.  Non-urgent messages can be sent to your provider as well.   To learn more about what you can do with MyChart, go to [email or web address].    Your next appointment:   3 week(s)  Provider:   You may see Debbe Odea, MD or one of the following Advanced Practice Providers on your designated Care Team:   Nicolasa Ducking, NP Eula Listen, PA-C Cadence Fransico Michael, PA-C Charlsie [facility], NP

## 2022-12-29 ENCOUNTER — Encounter: Payer: Self-pay | Admitting: Family Medicine

## 2022-12-29 ENCOUNTER — Ambulatory Visit (INDEPENDENT_AMBULATORY_CARE_PROVIDER_SITE_OTHER): Payer: BC Managed Care – PPO | Admitting: Family Medicine

## 2022-12-29 VITALS — BP 128/72 | HR 80 | Resp 16 | Ht 70.0 in | Wt 199.0 lb

## 2022-12-29 DIAGNOSIS — Z0000 Encounter for general adult medical examination without abnormal findings: Secondary | ICD-10-CM | POA: Diagnosis not present

## 2023-01-11 ENCOUNTER — Telehealth: Payer: Self-pay | Admitting: Family Medicine

## 2023-01-11 NOTE — Telephone Encounter (Signed)
Pt called in and stated he needs a PA on his sleep meds and wasn't sure if his sent in the fax?

## 2023-01-12 NOTE — Telephone Encounter (Signed)
PA submitted through CoverMyMeds. (Key: [id number])

## 2023-01-19 ENCOUNTER — Ambulatory Visit: Payer: BC Managed Care – PPO | Attending: Cardiology | Admitting: Cardiology

## 2023-01-19 ENCOUNTER — Encounter: Payer: Self-pay | Admitting: Cardiology

## 2023-01-19 VITALS — BP 104/68 | HR 53 | Ht 70.0 in | Wt 203.4 lb

## 2023-01-19 DIAGNOSIS — E7800 Pure hypercholesterolemia, unspecified: Secondary | ICD-10-CM | POA: Diagnosis not present

## 2023-01-19 DIAGNOSIS — R072 Precordial pain: Secondary | ICD-10-CM

## 2023-01-19 MED ORDER — METOPROLOL SUCCINATE ER 25 MG PO TB24
12.5000 mg | ORAL_TABLET | Freq: Two times a day (BID) | ORAL | 3 refills | Status: DC
Start: 2023-01-19 — End: 2023-06-22

## 2023-01-19 NOTE — Progress Notes (Signed)
Cardiology Office Note:    Date:  01/19/2023   ID:  Gregory Matthews, DOB 14-Nov-1980, MRN [id number]  PCP:  Alba Cory, MD   [facility] Providers Cardiologist:  Debbe Odea, MD     Referring MD: Alba Cory, MD   Chief Complaint  Patient presents with   Follow-up    3 week follow up.  Patient reports the addition of morning Metoprolol has eased the chest pain but but pain recurs at night.      History of Present Illness:    Gregory Matthews is a 42 y.o. male with a hx of migraines, hyperlipidemia, depression who presents for follow-up.  Being seen with symptoms of chest pain improving with nitrates.  Did not tolerate Imdur, Toprol-XL 25 mg daily was started with good effect.  States pain is better after taking Toprol-XL, but went Zoloft towards the evening..  Overall feels well, no concerns at this time.  Prior notes Echo 05/2022 EF 60 to 65%, normal diastolic function Coronary CT 10/8293 no CAD. Not on Imdur due to headaches. Father had a heart attack with stent placement in his early 78s.   Past Medical History:  Diagnosis Date   Acute pharyngitis    Hay fever    Migraine    Sinusitis, acute     Past Surgical History:  Procedure Laterality Date   HAIR TRANSPLANT     RHINOPLASTY     TONSILLECTOMY      Current Medications: Current Meds  Medication Sig   amphetamine-dextroamphetamine (ADDERALL) 10 MG tablet Take 1 tablet (10 mg total) by mouth 2 (two) times daily.   Daridorexant HCl (QUVIVIQ) 50 MG TABS Take 1 tablet (50 mg total) by mouth at bedtime. For sleep   escitalopram (LEXAPRO) 10 MG tablet Take 1 tablet (10 mg total) by mouth daily.   [DISCONTINUED] metoprolol succinate (TOPROL-XL) 25 MG 24 hr tablet Take 1 tablet (25 mg total) by mouth daily. Take with or immediately following a meal.     Allergies:   Elavil [amitriptyline] and Penicillins   Social History   Socioeconomic History   Marital status: Married    Spouse name:  Morrie Sheldon    Number of children: 0   Years of education: Not on file   Highest education level: Bachelor's degree (e.g., BA, AB, BS)  Occupational History   Occupation: Runner, broadcasting/film/video   Tobacco Use   Smoking status: Never    Passive exposure: Past   Smokeless tobacco: Never  Vaping Use   Vaping status: Never Used  Substance and Sexual Activity   Alcohol use: No    Alcohol/week: 0.0 standard drinks of alcohol   Drug use: No   Sexual activity: Yes    Partners: Female  Other Topics Concern   Not on file  Social History Narrative   Married, he is a Runner, broadcasting/film/video   Social Determinants of Corporate investment banker Strain: Low Risk  (12/29/2022)   Overall Financial Resource Strain (CARDIA)    Difficulty of Paying Living Expenses: Not hard at all  Food Insecurity: No Food Insecurity (12/29/2022)   Hunger Vital Sign    Worried About Running Out of Food in the Last Year: Never true    Ran Out of Food in the Last Year: Never true  Transportation Needs: No Transportation Needs (12/29/2022)   PRAPARE - Administrator, Civil Service (Medical): No    Lack of Transportation (Non-Medical): No  Physical Activity: Insufficiently Active (12/29/2022)   Exercise  Vital Sign    Days of Exercise per Week: 2 days    Minutes of Exercise per Session: 20 min  Stress: No Stress Concern Present (12/29/2022)   [employer] of Occupational Health - Occupational Stress Questionnaire    Feeling of Stress : Only a little  Social Connections: Socially Integrated (12/29/2022)   Social Connection and Isolation Panel [NHANES]    Frequency of Communication with Friends and Family: More than three times a week    Frequency of Social Gatherings with Friends and Family: More than three times a week    Attends Religious Services: More than 4 times per year    Active Member of [employer] or Organizations: Yes    Attends Engineer, structural: More than 4 times per year    Marital Status: Married     Family  History: The patient's family history includes Cancer in his maternal aunt and mother; Heart attack in his father, maternal grandfather, and paternal grandfather; Heart disease in his father; Hyperlipidemia in his maternal grandmother; Hypertension in his father; Parkinson's disease in his paternal grandfather.  ROS:   Please see the history of present illness.     All other systems reviewed and are negative.  EKGs/Labs/Other Studies Reviewed:    The following studies were reviewed today:   EKG Interpretation Date/Time:  Thursday January 19 2023 10:38:02 EDT Ventricular Rate:  53 PR Interval:  176 QRS Duration:  82 QT Interval:  414 QTC Calculation: 388 R Axis:   53  Text Interpretation: Sinus bradycardia Confirmed by Debbe Odea ([id number]) on 01/19/2023 10:45:26 AM    Recent Labs: 11/03/2022: ALT 38; BUN 21; Creat 0.92; Hemoglobin 15.9; Platelets 351; Potassium 4.8; Sodium 138  Recent Lipid Panel    Component Value Date/Time   CHOL 223 (H) 11/03/2022 0945   TRIG 100 11/03/2022 0945   HDL 51 11/03/2022 0945   CHOLHDL 4.4 11/03/2022 0945   VLDL 21 12/08/2015 1003   LDLCALC 151 (H) 11/03/2022 0945     Risk Assessment/Calculations:             Physical Exam:    VS:  BP 104/68 (BP Location: Left Arm, Patient Position: Sitting, Cuff Size: Large)   Pulse (!) 53   Ht 5\' 10"  (1.778 m)   Wt 203 lb 6.4 oz (92.3 kg)   SpO2 99%   BMI 29.18 kg/m     Wt Readings from Last 3 Encounters:  01/19/23 203 lb 6.4 oz (92.3 kg)  12/29/22 199 lb (90.3 kg)  12/28/22 202 lb 3.2 oz (91.7 kg)     GEN:  Well nourished, well developed in no acute distress HEENT: Normal NECK: No JVD; No carotid bruits CARDIAC: RRR, no murmurs, rubs, gallops RESPIRATORY:  Clear to auscultation without rales, wheezing or rhonchi  ABDOMEN: Soft, non-tender, non-distended MUSCULOSKELETAL:  No edema; No deformity  SKIN: Warm and dry NEUROLOGIC:  Alert and oriented x 3 PSYCHIATRIC:  Normal affect    ASSESSMENT:    1. Precordial pain   2. Pure hypercholesterolemia    PLAN:    In order of problems listed above:  Chest pain, echo with normal EF, coronary CT with no CAD.  improvement with beta-blocker, did not tolerate Imdur in the past..  Half Toprol-XL and take 12.5mg  in the AM and 12.5mg  in p.m.  Avoiding going up on beta-blocker due to low normal BP and bradycardia. Hyperlipidemia, continue low-cholesterol diet.  Not in statin benefit group.  Follow-up in 5 months.  Medication Adjustments/Labs and Tests Ordered: Current medicines are reviewed at length with the patient today.  Concerns regarding medicines are outlined above.  Orders Placed This Encounter  Procedures   EKG 12-Lead   Meds ordered this encounter  Medications   metoprolol succinate (TOPROL-XL) 25 MG 24 hr tablet    Sig: Take 0.5 tablets (12.5 mg total) by mouth in the morning and at bedtime. Take with or immediately following a meal.    Dispense:  90 tablet    Refill:  3    Patient Instructions  Medication Instructions:   Take Metoprolol - One half tablet (12.5mg ) by mouth twice a day.   *If you need a refill on your cardiac medications before your next appointment, please call your pharmacy*   Lab Work:  . None Ordered  If you have labs (blood work) drawn today and your tests are completely normal, you will receive your results only by: MyChart Message (if you have MyChart) OR A paper copy in the mail If you have any lab test that is abnormal or we need to change your treatment, we will call you to review the results.   Testing/Procedures:  None Ordered   Follow-Up: At [facility], you and your health needs are our priority.  As part of our continuing mission to provide you with exceptional heart care, we have created designated Provider Care Teams.  These Care Teams include your primary Cardiologist (physician) and Advanced Practice Providers (APPs -  Physician Assistants and  Nurse Practitioners) who all work together to provide you with the care you need, when you need it.  We recommend signing up for the patient portal called "MyChart".  Sign up information is provided on this After Visit Summary.  MyChart is used to connect with patients for Virtual Visits (Telemedicine).  Patients are able to view lab/test results, encounter notes, upcoming appointments, etc.  Non-urgent messages can be sent to your provider as well.   To learn more about what you can do with MyChart, go to [email or web address].    Your next appointment:   5 month(s)  Provider:   You may see Debbe Odea, MD or one of the following Advanced Practice Providers on your designated Care Team:   Nicolasa Ducking, NP Eula Listen, PA-C Cadence Fransico Michael, PA-C Charlsie Quest, NP    Signed, Debbe Odea, MD  01/19/2023 12:20 PM    [facility]

## 2023-01-19 NOTE — Patient Instructions (Signed)
Medication Instructions:   Take Metoprolol - One half tablet (12.5mg ) by mouth twice a day.   *If you need a refill on your cardiac medications before your next appointment, please call your pharmacy*   Lab Work:  . None Ordered  If you have labs (blood work) drawn today and your tests are completely normal, you will receive your results only by: MyChart Message (if you have MyChart) OR A paper copy in the mail If you have any lab test that is abnormal or we need to change your treatment, we will call you to review the results.   Testing/Procedures:  None Ordered   Follow-Up: At [facility], you and your health needs are our priority.  As part of our continuing mission to provide you with exceptional heart care, we have created designated Provider Care Teams.  These Care Teams include your primary Cardiologist (physician) and Advanced Practice Providers (APPs -  Physician Assistants and Nurse Practitioners) who all work together to provide you with the care you need, when you need it.  We recommend signing up for the patient portal called "MyChart".  Sign up information is provided on this After Visit Summary.  MyChart is used to connect with patients for Virtual Visits (Telemedicine).  Patients are able to view lab/test results, encounter notes, upcoming appointments, etc.  Non-urgent messages can be sent to your provider as well.   To learn more about what you can do with MyChart, go to [email or web address].    Your next appointment:   5 month(s)  Provider:   You may see Debbe Odea, MD or one of the following Advanced Practice Providers on your designated Care Team:   Nicolasa Ducking, NP Eula Listen, PA-C Cadence Fransico Michael, PA-C Charlsie Quest, NP

## 2023-02-02 NOTE — Progress Notes (Signed)
Name: Gregory Matthews   MRN: [id number]    DOB: 01-27-1981   Date:02/03/2023       Progress Note  Subjective  Chief Complaint  Medication Refill  HPI  MDD/GAD: diagnosed 05/2019 he currently only on prn visits to his therapist. 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 He would like to continue taking medications for now   Insomnia: he has tried Hydroxizine , Trazodone and Temazepam but either had side effects of inefective currently on Quviviq and has been able to fall and stay asleep, he is not feeling groggy when he wakes up int he morning   Low back pain: currently doing okay , he takes Skelaxin prn , medication is old but still has some at home   Migraine:We have tried Topamax and Imitrex. He has stopped both medication secondary to side effects. Topamax he stopped because of change in taste and Imitrex, because flushing sensation and chest tightness. Episodes of migraine are described as a dull  headache, followed by nausea and the pain intensifies and becomes throbbing or  pounding sensation in different parts of his head.  It is associated with photophobia, phonophobia and movement - such as walking  He stopped  Elavil due to side effects.He was on Depakote  for mood and migraine but he stopped medication . No recent episodes of migraine. No longer taking Imdur and daily headaches resolved   Dyslipidemia: also negative cardiac calcium score    The 10-year ASCVD risk score (Arnett DK, et al., 2019) is: 1.7%   Values used to calculate the score:     Age: 42 years     Sex: Male     Is Non-Hispanic African American: No     Diabetic: No     Tobacco smoker: No     Systolic Blood Pressure: 118 mmHg     Is BP treated: No      HDL Cholesterol: 51 mg/dL     Total Cholesterol: 223 mg/dL    W09 deficiency: taking SL B12 a few times a week   Vitamin D deficiency: continue daily vitamin D supplementation    Obesity:He has tried multiple diets. Tried Keto, personal trainers, [employer]. He joined a [employer] Loss program January 2024 , currently taking some herbal drops , he skips breakfast and follows a low carbohydrate diet .  His  initial weight was 237 lbs , at one point it went down to  201.6 lbs, four months ago he was 203 lbs  but he has been on vacation and also work conference and unable to follow his diet appropriately, today weight is back up at 210 lbs   Precordial pain : went to [facility] , he was seen by Dr. Myriam Forehand on 01/15 and cardiac calcium score was negative , echocardiogram  showed normal EF. He states episodes seems to be associated with tachycardia. He was seen by cardiologist. Jamal Maes Imdur but caused headache, he was given Toprol XL and it helped symptoms but not last 24 hours, currently taking half pill twice daily and seems to be better also controlling his chest pain   Patient Active Problem List   Diagnosis Date Noted   Major depression in  remission (HCC) 04/14/2021   B12 deficiency 04/14/2021   Vitamin D deficiency 04/14/2021   GAD (generalized anxiety disorder) 04/14/2021   Headache, migraine 06/08/2015   Obesity (BMI 30.0-34.9) 06/08/2015   Allergic rhinitis, seasonal 07/26/2013    Past Surgical History:  Procedure Laterality Date   HAIR TRANSPLANT     RHINOPLASTY     TONSILLECTOMY      Family History  Problem Relation Age of Onset   Cancer Mother        Breast   Hypertension Father    Heart disease Father    Heart attack Father    Cancer Maternal Aunt        Breast   Hyperlipidemia Maternal Grandmother    Heart attack Maternal Grandfather    Heart attack Paternal Grandfather    Parkinson's disease Paternal Grandfather     Social History   Tobacco Use    Smoking status: Never    Passive exposure: Past   Smokeless tobacco: Never  Substance Use Topics   Alcohol use: No    Alcohol/week: 0.0 standard drinks of alcohol     Current Outpatient Medications:    amphetamine-dextroamphetamine (ADDERALL) 10 MG tablet, Take 1 tablet (10 mg total) by mouth 2 (two) times daily., Disp: 180 tablet, Rfl: 0   Daridorexant HCl (QUVIVIQ) 50 MG TABS, Take 1 tablet (50 mg total) by mouth at bedtime. For sleep, Disp: 30 tablet, Rfl: 4   escitalopram (LEXAPRO) 10 MG tablet, Take 1 tablet (10 mg total) by mouth daily., Disp: 90 tablet, Rfl: 0   metoprolol succinate (TOPROL-XL) 25 MG 24 hr tablet, Take 0.5 tablets (12.5 mg total) by mouth in the morning and at bedtime. Take with or immediately following a meal., Disp: 90 tablet, Rfl: 3  Allergies  Allergen Reactions   Elavil [Amitriptyline] Anxiety   Penicillins Rash    I personally reviewed active problem list, medication list, allergies, family history, social history, health maintenance with the patient/caregiver today.   ROS  Ten systems reviewed and is negative except as mentioned in HPI    Objective  Vitals:   02/03/23 1347  BP: 118/74  Pulse: 85  Resp: 16  SpO2: 98%  Weight: 210 lb (95.3 kg)  Height: 5\' 10"  (1.778 m)    Body mass index is 30.13 kg/m.  Physical Exam  Constitutional: Patient appears well-developed and well-nourished. Obese  No distress.  HEENT: head atraumatic, normocephalic, pupils equal and reactive to light, neck supple Cardiovascular: Normal rate, regular rhythm and normal heart sounds.  No murmur heard. No BLE edema. Pulmonary/Chest: Effort normal and breath sounds normal. No respiratory distress. Abdominal: Soft.  There is no tenderness. Psychiatric: Patient has a normal mood and affect. behavior is normal. Judgment and thought content normal.    PHQ2/9:    02/03/2023    1:47 PM 12/29/2022   11:52 AM 11/03/2022    9:03 AM 07/28/2022   10:17 AM 05/25/2022    3:06  PM  Depression screen PHQ 2/9  Decreased Interest 0 0 0 0 0  Down, Depressed, Hopeless 0 0 0 1 0  PHQ - 2 Score 0 0 0 1 0  Altered sleeping 1 1 2 3 1   Tired, decreased energy 0 0 0 0 0  Change in appetite 0 0 0 0 0  Feeling bad or failure about yourself  0 0 0 0 0  Trouble concentrating 0 0 0 0 0  Moving slowly or fidgety/restless 0 0  0 0  Suicidal thoughts  0 0 0 0 0  PHQ-9 Score 1 1 2 4 1   Difficult doing work/chores   Not difficult at all      phq 9 is negative   Fall Risk:    02/03/2023    1:47 PM 12/29/2022   11:44 AM 11/03/2022    9:03 AM 07/28/2022   10:17 AM 05/25/2022    3:06 PM  Fall Risk   Falls in the past year? 0 0 0 0 0  Number falls in past yr: 0 0  0 0  Injury with Fall? 0 0  0 0  Risk for fall due to : No Fall Risks No Fall Risks No Fall Risks No Fall Risks No Fall Risks  Follow up Falls prevention discussed Falls prevention discussed Falls prevention discussed Falls prevention discussed Falls prevention discussed      Functional Status Survey: Is the patient deaf or have difficulty hearing?: No Does the patient have difficulty seeing, even when wearing glasses/contacts?: No Does the patient have difficulty concentrating, remembering, or making decisions?: No Does the patient have difficulty walking or climbing stairs?: No Does the patient have difficulty dressing or bathing?: No Does the patient have difficulty doing errands alone such as visiting a doctor's office or shopping?: No    Assessment & Plan  1. Major depression in remission (HCC)  - escitalopram (LEXAPRO) 10 MG tablet; Take 1 tablet (10 mg total) by mouth daily.  Dispense: 90 tablet; Refill: 1 - amphetamine-dextroamphetamine (ADDERALL) 10 MG tablet; Take 1 tablet (10 mg total) by mouth 2 (two) times daily.  Dispense: 180 tablet; Refill: 0  2. Precordial chest pain  Doing better on beta blockers  3. Migraine without aura and without status migrainosus, not intractable  Improved  4.  Psychophysiological insomnia  - Daridorexant HCl (QUVIVIQ) 50 MG TABS; Take 1 tablet (50 mg total) by mouth at bedtime. For sleep  Dispense: 30 tablet; Refill: 5  5. Dyslipidemia  Low ASCVD  6. B12 deficiency  Continue supplementation  7. Vitamin D deficiency  Continue supplements  8. GAD (generalized anxiety disorder)  Continue medication   9. Needs flu shot  - Flu vaccine trivalent PF, 6mos and older(Flulaval,Afluria,Fluarix,Fluzone)

## 2023-02-03 ENCOUNTER — Encounter: Payer: Self-pay | Admitting: Family Medicine

## 2023-02-03 ENCOUNTER — Ambulatory Visit: Payer: BC Managed Care – PPO | Admitting: Family Medicine

## 2023-02-03 VITALS — BP 118/74 | HR 85 | Resp 16 | Ht 70.0 in | Wt 210.0 lb

## 2023-02-03 DIAGNOSIS — R072 Precordial pain: Secondary | ICD-10-CM

## 2023-02-03 DIAGNOSIS — G43009 Migraine without aura, not intractable, without status migrainosus: Secondary | ICD-10-CM | POA: Diagnosis not present

## 2023-02-03 DIAGNOSIS — Z23 Encounter for immunization: Secondary | ICD-10-CM

## 2023-02-03 DIAGNOSIS — F5104 Psychophysiologic insomnia: Secondary | ICD-10-CM

## 2023-02-03 DIAGNOSIS — E538 Deficiency of other specified B group vitamins: Secondary | ICD-10-CM

## 2023-02-03 DIAGNOSIS — E785 Hyperlipidemia, unspecified: Secondary | ICD-10-CM

## 2023-02-03 DIAGNOSIS — F411 Generalized anxiety disorder: Secondary | ICD-10-CM

## 2023-02-03 DIAGNOSIS — F325 Major depressive disorder, single episode, in full remission: Secondary | ICD-10-CM

## 2023-02-03 DIAGNOSIS — E559 Vitamin D deficiency, unspecified: Secondary | ICD-10-CM

## 2023-02-03 MED ORDER — QUVIVIQ 50 MG PO TABS
1.0000 | ORAL_TABLET | Freq: Every evening | ORAL | 5 refills | Status: DC
Start: 2023-02-03 — End: 2023-05-19

## 2023-02-03 MED ORDER — ESCITALOPRAM OXALATE 10 MG PO TABS
10.0000 mg | ORAL_TABLET | Freq: Every day | ORAL | 1 refills | Status: DC
Start: 2023-02-03 — End: 2023-07-12

## 2023-02-03 MED ORDER — AMPHETAMINE-DEXTROAMPHETAMINE 10 MG PO TABS
10.0000 mg | ORAL_TABLET | Freq: Two times a day (BID) | ORAL | 0 refills | Status: DC
Start: 2023-02-03 — End: 2023-07-12

## 2023-02-20 ENCOUNTER — Ambulatory Visit: Payer: BC Managed Care – PPO | Admitting: Family Medicine

## 2023-03-09 ENCOUNTER — Ambulatory Visit: Payer: BC Managed Care – PPO | Admitting: Physician Assistant

## 2023-03-09 ENCOUNTER — Encounter: Payer: Self-pay | Admitting: Physician Assistant

## 2023-03-09 VITALS — BP 108/64 | HR 95 | Temp 97.7°F | Resp 16 | Ht 70.0 in | Wt 214.1 lb

## 2023-03-09 DIAGNOSIS — J069 Acute upper respiratory infection, unspecified: Secondary | ICD-10-CM | POA: Diagnosis not present

## 2023-03-09 LAB — POCT RAPID STREP A (OFFICE): Rapid Strep A Screen: NEGATIVE

## 2023-03-09 MED ORDER — BENZONATATE 100 MG PO CAPS
100.0000 mg | ORAL_CAPSULE | Freq: Two times a day (BID) | ORAL | 0 refills | Status: DC | PRN
Start: 2023-03-09 — End: 2023-05-22

## 2023-03-09 MED ORDER — PREDNISONE 20 MG PO TABS
40.0000 mg | ORAL_TABLET | Freq: Every day | ORAL | 0 refills | Status: AC
Start: 2023-03-09 — End: 2023-03-14

## 2023-03-09 MED ORDER — DOXYCYCLINE HYCLATE 100 MG PO TABS
100.0000 mg | ORAL_TABLET | Freq: Two times a day (BID) | ORAL | 0 refills | Status: AC
Start: 2023-03-09 — End: 2023-03-16

## 2023-03-09 NOTE — Progress Notes (Signed)
Acute Office Visit   Patient: Gregory Matthews   DOB: 11-06-1980   42 y.o. Male  MRN: [id number] Visit Date: 03/09/2023  Today's healthcare provider: Oswaldo Conroy Perris Conwell, PA-C  Introduced myself to the patient as a Secondary school teacher and provided education on APPs in clinical practice.    Chief Complaint  Patient presents with   Cough    Chest congestion unable to cough it up, has tried Theraflu sx eased off but now getting worst   Nasal Congestion   Sore Throat    Onset for 2 weeks, on/off   Subjective    HPI HPI     Cough    Additional comments: Chest congestion unable to cough it up, has tried Theraflu sx eased off but now getting worst        Sore Throat    Additional comments: Onset for 2 weeks, on/off      Last edited by Forde Radon, CMA on 03/09/2023  8:53 AM.      URI -type symptoms  Onset: sudden  Duration: started on Nov 4th with sore throat Associated symptoms: nasal congestion, sore throat, chest tightness, dry cough, chills, sweating, he reports single loose stool this AM  Unsure of fever  He feels like his symptoms are lingering and his chest is feeing tighter    Intervention: Theraflu, Dayquil, Nyquil, Vitamin C, Mucinex DM   Recent sick contacts: his wife had a stomach virus but this has resolved. He reports she has started to have sore throat after his symptoms started  Recent travel: traveled to [facility]- symptoms had started prior to trip  COVID testing at home: has not tested at home   Result: NA    Medications: Outpatient Medications Prior to Visit  Medication Sig   amphetamine-dextroamphetamine (ADDERALL) 10 MG tablet Take 1 tablet (10 mg total) by mouth 2 (two) times daily.   Daridorexant HCl (QUVIVIQ) 50 MG TABS Take 1 tablet (50 mg total) by mouth at bedtime. For sleep   escitalopram (LEXAPRO) 10 MG tablet Take 1 tablet (10 mg total) by mouth daily.   metoprolol succinate (TOPROL-XL) 25 MG 24 hr tablet Take 0.5 tablets  (12.5 mg total) by mouth in the morning and at bedtime. Take with or immediately following a meal.   No facility-administered medications prior to visit.    Review of Systems  Constitutional:  Positive for chills and fatigue. Negative for fever.  HENT:  Positive for congestion, ear pain, rhinorrhea, sinus pressure, sinus pain and sore throat.   Respiratory:  Positive for cough and chest tightness.   Gastrointestinal:  Negative for diarrhea, nausea and vomiting.  Neurological:  Positive for headaches.  Hematological:  Positive for adenopathy.        Objective    BP 108/64   Pulse 95   Temp 97.7 F (36.5 C) (Oral)   Resp 16   Ht 5\' 10"  (1.778 m)   Wt 214 lb 1.6 oz (97.1 kg)   SpO2 97%   BMI 30.72 kg/m     Physical Exam Vitals reviewed.  Constitutional:      General: He is awake.     Appearance: Normal appearance. He is well-developed and well-groomed. He is ill-appearing and diaphoretic.     Comments: Patient became diaphoretic and nauseous during apt- provide emesis bag and he appeared to vomit small amount after several dry heaves   HENT:     Head: Normocephalic and atraumatic.  Right Ear: Hearing and ear canal normal. Tympanic membrane is retracted.     Left Ear: Hearing and ear canal normal. Tympanic membrane is retracted.     Mouth/Throat:     Lips: Pink.     Pharynx: Uvula midline. Posterior oropharyngeal erythema present. No pharyngeal swelling, oropharyngeal exudate or postnasal drip.  Eyes:     General: Lids are normal. Gaze aligned appropriately.  Cardiovascular:     Rate and Rhythm: Normal rate and regular rhythm.     Pulses: Normal pulses.          Radial pulses are 2+ on the right side and 2+ on the left side.     Heart sounds: Normal heart sounds. No murmur heard.    No friction rub. No gallop.  Pulmonary:     Effort: Pulmonary effort is normal.     Breath sounds: Normal breath sounds. No decreased air movement. No decreased breath sounds,  wheezing, rhonchi or rales.  Lymphadenopathy:     Head:     Right side of head: No submental, submandibular or preauricular adenopathy.     Left side of head: No submental, submandibular or preauricular adenopathy.     Cervical:     Right cervical: No superficial cervical adenopathy.    Left cervical: No superficial cervical adenopathy.     Upper Body:     Right upper body: No supraclavicular adenopathy.     Left upper body: No supraclavicular adenopathy.  Neurological:     Mental Status: He is alert.  Psychiatric:        Mood and Affect: Mood normal.        Behavior: Behavior normal. Behavior is cooperative.       No results found for any visits on 03/09/23.  Assessment & Plan      No follow-ups on file.      Problem List Items Addressed This Visit   None Visit Diagnoses     Upper respiratory tract infection, unspecified type    -  Primary   Relevant Medications   doxycycline (VIBRA-TABS) 100 MG tablet   predniSONE (DELTASONE) 20 MG tablet   benzonatate (TESSALON) 100 MG capsule      Visit with patient indicates symptoms comprised of dry cough, sore throat, chest tightness,  since 02/27/23 congruent with acute URI  Rapid strep was negative- results reviewed with him during apt  Patient is outside therapeutic window for antivirals- no flu or COVID testing performed today.  Will send in script for Doxycycline given persistent nature of symptoms and location- suspect bacterial sinusitis at this time Will also provide Prednisone 40 mg PO every day x 5 day burst to assist with chest tightness and tessalon pearls for coughing He declined antiemetic since this was first and only instance of nausea and vomiting since illness started. Discussed with patient the various viral and bacterial etiologies of current illness and appropriate course of treatment Discussed OTC medication options for multisymptom relief such as Dayquil/Nyquil, Theraflu, AlkaSeltzer, etc. Discussed return  precautions if symptoms are not improving or worsen over next 5-7 days.    No follow-ups on file.   I, Kaiyan Luczak E Ethylene Reznick, PA-C, have reviewed all documentation for this visit. The documentation on 03/09/23 for the exam, diagnosis, procedures, and orders are all accurate and complete.   Jacquelin Hawking, MHS, PA-C [facility]

## 2023-04-30 ENCOUNTER — Encounter: Payer: Self-pay | Admitting: *Deleted

## 2023-04-30 ENCOUNTER — Other Ambulatory Visit: Payer: Self-pay

## 2023-04-30 ENCOUNTER — Ambulatory Visit
Admission: EM | Admit: 2023-04-30 | Discharge: 2023-04-30 | Disposition: A | Discharge status: Home / Self Care | Payer: 59 | Attending: Emergency Medicine | Admitting: Emergency Medicine

## 2023-04-30 DIAGNOSIS — H6501 Acute serous otitis media, right ear: Secondary | ICD-10-CM

## 2023-04-30 HISTORY — DX: Depression, unspecified: F32.A

## 2023-04-30 HISTORY — DX: Attention-deficit hyperactivity disorder, unspecified type: F90.9

## 2023-04-30 LAB — POCT RAPID STREP A (OFFICE): Rapid Strep A Screen: NEGATIVE

## 2023-04-30 MED ORDER — CEFDINIR 300 MG PO CAPS: 300.0000 mg | ORAL_CAPSULE | Freq: Two times a day (BID) | ORAL | 0 refills | Status: AC

## 2023-04-30 MED ORDER — LIDOCAINE VISCOUS HCL 2 % MT SOLN: 15.0000 mL | OROMUCOSAL | 0 refills | Status: DC | PRN

## 2023-04-30 NOTE — ED Triage Notes (Signed)
 C/O sore throat, left earache, chills, fever onset 2 days ago. C/o slight nasal congestion onset last night. States throat has severe pain and painful swallowing. Denies cough. Has been taking Theraflu, Nyquil, and Dayquil.

## 2023-04-30 NOTE — Discharge Instructions (Signed)
 Today you are being treated for an infection of the eardrum  Take cefdinir  twice daily for 10 days, you should begin to see improvement after 48 hours of medication use and then it should progressively get better  Gargle and spit lidocaine  solution as needed for temporary relief of your throat pain  You may use Tylenol or ibuprofen for management of discomfort  May hold warm compresses to the ear for additional comfort  Please not attempted any ear cleaning or object or fluid placement into the ear canal to prevent further irritation   For cough: honey 1/2 to 1 teaspoon (you can dilute the honey in water or another fluid).  You can also use guaifenesin  and dextromethorphan for cough. You can use a humidifier for chest congestion and cough.  If you don't have a humidifier, you can sit in the bathroom with the hot shower running.      For sore throat: try warm salt water gargles, cepacol lozenges, throat spray, warm tea or water with lemon/honey, popsicles or ice, or OTC cold relief medicine for throat discomfort.   For congestion: take a daily anti-histamine like Zyrtec, Claritin, and a oral decongestant, such as pseudoephedrine.  You can also use Flonase 1-2 sprays in each nostril daily.   It is important to stay hydrated: drink plenty of fluids (water, gatorade/powerade/pedialyte, juices, or teas) to keep your throat moisturized and help further relieve irritation/discomfort.

## 2023-04-30 NOTE — ED Provider Notes (Signed)
 Gregory Matthews    CSN: [id number] Arrival date & time: 04/30/23  9095      History   Chief Complaint Chief Complaint  Patient presents with   Sore Throat   Otalgia    HPI Gregory Matthews is a 43 y.o. male.   Patient presents for evaluation of fever peaking at 101.6, chills, nasal congestion, rhinorrhea, sore throat, bilateral ear pain and a nonproductive cough present for 2 days.  Painful to swallow but able to tolerate food and liquids.  Known sick contacts.  Has attempted use of NyQuil, DayQuil and TheraFlu.  Denies shortness of breath and wheezing.  Past Medical History:  Diagnosis Date   Acute pharyngitis    ADHD    Depression    Hay fever    Migraine    Sinusitis, acute     Patient Active Problem List   Diagnosis Date Noted   Major depression in remission (HCC) 04/14/2021   B12 deficiency 04/14/2021   Vitamin D  deficiency 04/14/2021   GAD (generalized anxiety disorder) 04/14/2021   Headache, migraine 06/08/2015   Obesity (BMI 30.0-34.9) 06/08/2015   Allergic rhinitis, seasonal 07/26/2013    Past Surgical History:  Procedure Laterality Date   HAIR TRANSPLANT     RHINOPLASTY     TONSILLECTOMY         Home Medications    Prior to Admission medications   Medication Sig Start Date End Date Taking? Authorizing Provider  amphetamine -dextroamphetamine  (ADDERALL) 10 MG tablet Take 1 tablet (10 mg total) by mouth 2 (two) times daily. 02/03/23  Yes Sowles, Krichna, MD  cefdinir  (OMNICEF ) 300 MG capsule Take 1 capsule (300 mg total) by mouth 2 (two) times daily for 10 days. 04/30/23 05/10/23 Yes Milley Vining R, NP  Daridorexant  HCl (QUVIVIQ ) 50 MG TABS Take 1 tablet (50 mg total) by mouth at bedtime. For sleep 02/03/23  Yes Sowles, Krichna, MD  escitalopram  (LEXAPRO ) 10 MG tablet Take 1 tablet (10 mg total) by mouth daily. 02/03/23  Yes Sowles, Krichna, MD  lidocaine  (XYLOCAINE ) 2 % solution Use as directed 15 mLs in the mouth or throat as needed. 04/30/23   Yes Destani Wamser, Shelba SAUNDERS, NP  metoprolol  succinate (TOPROL -XL) 25 MG 24 hr tablet Take 0.5 tablets (12.5 mg total) by mouth in the morning and at bedtime. Take with or immediately following a meal. 01/19/23 01/14/24 Yes Agbor-Etang, Redell, MD  benzonatate  (TESSALON ) 100 MG capsule Take 1 capsule (100 mg total) by mouth 2 (two) times daily as needed for cough. 03/09/23   Mecum, Rocky BRAVO, PA-C    Family History Family History  Problem Relation Age of Onset   Cancer Mother        Breast   Hypertension Father    Heart disease Father    Heart attack Father    Cancer Maternal Aunt        Breast   Hyperlipidemia Maternal Grandmother    Heart attack Maternal Grandfather    Heart attack Paternal Grandfather    Parkinson's disease Paternal Grandfather     Social History Social History   Tobacco Use   Smoking status: Never    Passive exposure: Past   Smokeless tobacco: Never  Vaping Use   Vaping status: Never Used  Substance Use Topics   Alcohol use: No    Alcohol/week: 0.0 standard drinks of alcohol   Drug use: No     Allergies   Elavil  [amitriptyline ] and Penicillins   Review of Systems Review of Systems  HENT:  Positive for ear pain.      Physical Exam Triage Vital Signs ED Triage Vitals  Encounter Vitals Group     BP 04/30/23 1020 (!) 142/86     Systolic BP Percentile --      Diastolic BP Percentile --      Pulse Rate 04/30/23 1020 79     Resp 04/30/23 1020 18     Temp 04/30/23 1020 98.4 F (36.9 C)     Temp Source 04/30/23 1020 Oral     SpO2 04/30/23 1020 97 %     Weight --      Height --      Head Circumference --      Peak Flow --      Pain Score 04/30/23 1021 9     Pain Loc --      Pain Education --      Exclude from Growth Chart --    No data found.  Updated Vital Signs BP (!) 142/86   Pulse 79   Temp 98.4 F (36.9 C) (Oral)   Resp 18   SpO2 97%   Visual Acuity Right Eye Distance:   Left Eye Distance:   Bilateral Distance:    Right Eye Near:    Left Eye Near:    Bilateral Near:     Physical Exam Constitutional:      Appearance: Normal appearance.  HENT:     Head: Normocephalic.     Right Ear: Ear canal and external ear normal. Tympanic membrane is erythematous.     Left Ear: Tympanic membrane, ear canal and external ear normal.     Nose: Congestion present. No rhinorrhea.     Mouth/Throat:     Pharynx: No oropharyngeal exudate or posterior oropharyngeal erythema.  Eyes:     Extraocular Movements: Extraocular movements intact.  Cardiovascular:     Rate and Rhythm: Normal rate and regular rhythm.     Pulses: Normal pulses.     Heart sounds: Normal heart sounds.  Pulmonary:     Effort: Pulmonary effort is normal.     Breath sounds: Normal breath sounds.  Musculoskeletal:     Cervical back: Normal range of motion and neck supple.  Neurological:     Mental Status: He is alert and oriented to person, place, and time. Mental status is at baseline.      UC Treatments / Results  Labs (all labs ordered are listed, but only abnormal results are displayed) Labs Reviewed  POCT RAPID STREP A (OFFICE)    EKG   Radiology No results found.  Procedures Procedures (including critical care time)  Medications Ordered in UC Medications - No data to display  Initial Impression / Assessment and Plan / UC Course  I have reviewed the triage vital signs and the nursing notes.  Pertinent labs & imaging results that were available during my care of the patient were reviewed by me and considered in my medical decision making (see chart for details).  Nonrecurrent acute serous otitis media of right ear  Erythema to the tympanic membrane is consistent with infection, congestion to the nasal turbinates otherwise stable exam, prescribed augmentin and viscous lidocaine  for management of throat as it is most worrisome symptom.  Rapid strep test negative, advised against ear cleaning, may use over-the-counter analgesics and warm  compresses to the external ear for comfort, may follow-up if symptoms persist worsen or recur  Final Clinical Impressions(s) / UC Diagnoses   Final diagnoses:  Non-recurrent  acute serous otitis media of right ear     Discharge Instructions      Today you are being treated for an infection of the eardrum  Take cefdinir  twice daily for 10 days, you should begin to see improvement after 48 hours of medication use and then it should progressively get better  Gargle and spit lidocaine  solution as needed for temporary relief of your throat pain  You may use Tylenol or ibuprofen for management of discomfort  May hold warm compresses to the ear for additional comfort  Please not attempted any ear cleaning or object or fluid placement into the ear canal to prevent further irritation   For cough: honey 1/2 to 1 teaspoon (you can dilute the honey in water or another fluid).  You can also use guaifenesin  and dextromethorphan for cough. You can use a humidifier for chest congestion and cough.  If you don't have a humidifier, you can sit in the bathroom with the hot shower running.      For sore throat: try warm salt water gargles, cepacol lozenges, throat spray, warm tea or water with lemon/honey, popsicles or ice, or OTC cold relief medicine for throat discomfort.   For congestion: take a daily anti-histamine like Zyrtec, Claritin, and a oral decongestant, such as pseudoephedrine.  You can also use Flonase 1-2 sprays in each nostril daily.   It is important to stay hydrated: drink plenty of fluids (water, gatorade/powerade/pedialyte, juices, or teas) to keep your throat moisturized and help further relieve irritation/discomfort.     ED Prescriptions     Medication Sig Dispense Auth. Provider   cefdinir  (OMNICEF ) 300 MG capsule Take 1 capsule (300 mg total) by mouth 2 (two) times daily for 10 days. 20 capsule Brylan Seubert R, NP   lidocaine  (XYLOCAINE ) 2 % solution Use as directed 15 mLs  in the mouth or throat as needed. 100 mL Teresa Shelba SAUNDERS, NP      PDMP not reviewed this encounter.   Teresa Shelba SAUNDERS, NP 04/30/23 1257

## 2023-05-12 ENCOUNTER — Encounter: Payer: Self-pay | Admitting: Family Medicine

## 2023-05-12 ENCOUNTER — Telehealth (INDEPENDENT_AMBULATORY_CARE_PROVIDER_SITE_OTHER): Payer: 59 | Admitting: Family Medicine

## 2023-05-12 DIAGNOSIS — J029 Acute pharyngitis, unspecified: Secondary | ICD-10-CM

## 2023-05-12 DIAGNOSIS — J069 Acute upper respiratory infection, unspecified: Secondary | ICD-10-CM | POA: Diagnosis not present

## 2023-05-12 DIAGNOSIS — R051 Acute cough: Secondary | ICD-10-CM

## 2023-05-12 DIAGNOSIS — H938X3 Other specified disorders of ear, bilateral: Secondary | ICD-10-CM

## 2023-05-12 MED ORDER — PREDNISONE 20 MG PO TABS: ORAL_TABLET | ORAL | 0 refills | Status: DC

## 2023-05-12 MED ORDER — BENZONATATE 100 MG PO CAPS: 100.0000 mg | ORAL_CAPSULE | Freq: Three times a day (TID) | ORAL | 1 refills | Status: DC | PRN

## 2023-05-12 MED ORDER — MOMETASONE FUROATE 50 MCG/ACT NA SUSP: 2.0000 | Freq: Every day | NASAL | 12 refills | Status: DC

## 2023-05-12 NOTE — Progress Notes (Deleted)
    Patient ID: Gregory Matthews, male    DOB: [birth year], 43 y.o.   MRN: [id number]  PCP: Alba Cory, MD  Chief Complaint  Patient presents with   URI    Stuffy head blood when blowing nose, cough and sore throat onset    Subjective:   Gregory Matthews is a 43 y.o. male, presents to clinic with CC of the following:  HPI     Patient Active Problem List   Diagnosis Date Noted   Major depression in remission (HCC) 04/14/2021   B12 deficiency 04/14/2021   Vitamin D deficiency 04/14/2021   GAD (generalized anxiety disorder) 04/14/2021   Headache, migraine 06/08/2015   Obesity (BMI 30.0-34.9) 06/08/2015   Allergic rhinitis, seasonal 07/26/2013      Current Outpatient Medications:    amphetamine-dextroamphetamine (ADDERALL) 10 MG tablet, Take 1 tablet (10 mg total) by mouth 2 (two) times daily., Disp: 180 tablet, Rfl: 0   benzonatate (TESSALON) 100 MG capsule, Take 1 capsule (100 mg total) by mouth 2 (two) times daily as needed for cough., Disp: 20 capsule, Rfl: 0   Daridorexant HCl (QUVIVIQ) 50 MG TABS, Take 1 tablet (50 mg total) by mouth at bedtime. For sleep, Disp: 30 tablet, Rfl: 5   escitalopram (LEXAPRO) 10 MG tablet, Take 1 tablet (10 mg total) by mouth daily., Disp: 90 tablet, Rfl: 1   lidocaine (XYLOCAINE) 2 % solution, Use as directed 15 mLs in the mouth or throat as needed., Disp: 100 mL, Rfl: 0   metoprolol succinate (TOPROL-XL) 25 MG 24 hr tablet, Take 0.5 tablets (12.5 mg total) by mouth in the morning and at bedtime. Take with or immediately following a meal., Disp: 90 tablet, Rfl: 3   Allergies  Allergen Reactions   Elavil [Amitriptyline] Anxiety   Penicillins Rash     Social History   Tobacco Use   Smoking status: Never    Passive exposure: Past   Smokeless tobacco: Never  Vaping Use   Vaping status: Never Used  Substance Use Topics   Alcohol use: No    Alcohol/week: 0.0 standard drinks of alcohol   Drug use: No      Chart Review  Today: ***  Review of Systems     Objective:   There were no vitals filed for this visit.  There is no height or weight on file to calculate BMI.  Physical Exam   Results for orders placed or performed during the hospital encounter of 04/30/23  POCT rapid strep A   Collection Time: 04/30/23 10:39 AM  Result Value Ref Range   Rapid Strep A Screen Negative Negative       Assessment & Plan:   ***     Danelle Berry, PA-C 05/12/23 2:32 PM

## 2023-05-12 NOTE — Patient Instructions (Signed)
Try tylenol and ibuprofen for aches and pains or fever The steroids should help your sore throat, cough and possibly some of your ear symptoms and congestion Try getting claritin-D for sinus congestion And start using intranasal

## 2023-05-12 NOTE — Progress Notes (Signed)
Name: Gregory Matthews   MRN: [id number]    DOB: [birth year]   Date:05/12/2023       Progress Note  Subjective:    Chief Complaint  Chief Complaint  Patient presents with   URI    Stuffy head blood when blowing nose, cough and sore throat onset    I connected with  Leeum Pelts [place]  on 05/12/23 at  2:40 PM EST by a video enabled telemedicine application and verified that I am speaking with the correct person using two identifiers.  I discussed the limitations of evaluation and management by telemedicine and the availability of in person appointments. The patient expressed understanding and agreed to proceed. Staff also discussed with the patient that there may be a patient responsible charge related to this service. Patient Location: home Provider Location: [facility] clinic Additional Individuals present: none  HPI  Pt got ill about 2 weeks ago he got a sore throat sx Friday night two weeks ago, ear ache, fever and chills He went to [facility] tx with abx for ear infection  Not all his sx are going away, developing cough, ears under water  Dayquil, nyquil, tylenol sinus severe cold Using afrin often at bedtime to be able to breath Pt had pneumonia in the past, he does not have CP with breathing or SOB at this time, fever has resolved, cough is starting He's most concerned with sore throat feeling the same as 2 weeks ago   Patient Active Problem List   Diagnosis Date Noted   Major depression in remission (HCC) 04/14/2021   B12 deficiency 04/14/2021   Vitamin D deficiency 04/14/2021   GAD (generalized anxiety disorder) 04/14/2021   Headache, migraine 06/08/2015   Obesity (BMI 30.0-34.9) 06/08/2015   Allergic rhinitis, seasonal 07/26/2013    Social History   Tobacco Use   Smoking status: Never    Passive exposure: Past   Smokeless tobacco: Never  Substance Use Topics   Alcohol use: No    Alcohol/week: 0.0 standard drinks of alcohol     Current Outpatient  Medications:    amphetamine-dextroamphetamine (ADDERALL) 10 MG tablet, Take 1 tablet (10 mg total) by mouth 2 (two) times daily., Disp: 180 tablet, Rfl: 0   benzonatate (TESSALON) 100 MG capsule, Take 1 capsule (100 mg total) by mouth 2 (two) times daily as needed for cough., Disp: 20 capsule, Rfl: 0   benzonatate (TESSALON) 100 MG capsule, Take 1-2 capsules (100-200 mg total) by mouth 3 (three) times daily as needed for cough., Disp: 30 capsule, Rfl: 1   Daridorexant HCl (QUVIVIQ) 50 MG TABS, Take 1 tablet (50 mg total) by mouth at bedtime. For sleep, Disp: 30 tablet, Rfl: 5   escitalopram (LEXAPRO) 10 MG tablet, Take 1 tablet (10 mg total) by mouth daily., Disp: 90 tablet, Rfl: 1   lidocaine (XYLOCAINE) 2 % solution, Use as directed 15 mLs in the mouth or throat as needed., Disp: 100 mL, Rfl: 0   metoprolol succinate (TOPROL-XL) 25 MG 24 hr tablet, Take 0.5 tablets (12.5 mg total) by mouth in the morning and at bedtime. Take with or immediately following a meal., Disp: 90 tablet, Rfl: 3   mometasone (NASONEX) 50 MCG/ACT nasal spray, Place 2 sprays into the nose daily., Disp: 17 g, Rfl: 12   predniSONE (DELTASONE) 20 MG tablet, 3 tabs poqday 1-3, 2 tabs poqday 4-6, 1 tab poqday 7-9, Disp: 18 tablet, Rfl: 0  Allergies  Allergen Reactions   Elavil [Amitriptyline] Anxiety  Penicillins Rash    I personally reviewed active problem list, medication list, allergies, family history, social history, health maintenance, notes from last encounter, lab results, imaging with the patient/caregiver today.   Review of Systems  Constitutional: Negative.   HENT: Negative.    Eyes: Negative.   Respiratory: Negative.    Cardiovascular: Negative.   Gastrointestinal: Negative.   Endocrine: Negative.   Genitourinary: Negative.   Musculoskeletal: Negative.   Skin: Negative.   Allergic/Immunologic: Negative.   Neurological: Negative.   Hematological: Negative.   Psychiatric/Behavioral: Negative.    All  other systems reviewed and are negative.     Objective:   Virtual encounter, vitals limited, only able to obtain the following There were no vitals filed for this visit. There is no height or weight on file to calculate BMI. Nursing Note and Vital Signs reviewed.  Physical Exam Vitals and nursing note reviewed.  Constitutional:      General: He is not in acute distress.    Appearance: Normal appearance. He is not ill-appearing, toxic-appearing or diaphoretic.  HENT:     Right Ear: External ear normal.     Left Ear: External ear normal.  Pulmonary:     Effort: No tachypnea, accessory muscle usage or respiratory distress.     Comments: Able to speak in full and complete sentences no audible wheeze or stridor intermittent coughing throughout encounter Neurological:     Mental Status: He is alert.  Psychiatric:        Mood and Affect: Mood normal.        Behavior: Behavior normal.     PE limited by virtual encounter  No results found for this or any previous visit (from the past 72 hours).  Assessment and Plan:   1. Upper respiratory tract infection, unspecified type (Primary) Onset illness 2 weeks ago -he finished cefdinir antibiotics x 10 days about 2 days ago and he continues to have some lingering symptoms Encouraged him to use Tylenol, ibuprofen, get a antihistamine, with Sudafed decongestant and intranasal steroid spray Continued symptoms at 2 weeks following a viral upper respiratory illness is not unusual and patient was reassured with supportive care and watchful waiting he may likely improve within the next week or 2  - predniSONE (DELTASONE) 20 MG tablet; 3 tabs poqday 1-3, 2 tabs poqday 4-6, 1 tab poqday 7-9  Dispense: 18 tablet; Refill: 0 - benzonatate (TESSALON) 100 MG capsule; Take 1-2 capsules (100-200 mg total) by mouth 3 (three) times daily as needed for cough.  Dispense: 30 capsule; Refill: 1 - mometasone (NASONEX) 50 MCG/ACT nasal spray; Place 2 sprays into  the nose daily.  Dispense: 17 g; Refill: 12  2. Ear fullness, bilateral He was treated for acute otitis media after urgent care visit on January 5 his ear pain has improved but he endorses ear fullness, I explained that I cannot completely evaluate his ear symptoms without examining him but it is likely that he has continued fluid and/or eustachian tube dysfunction and explained this is treated with decongestants antihistamines and intranasal steroid sprays He was advised to come into the office in person for examination if he has any worsening  3. Pharyngitis, unspecified etiology Likely viral Any bacteria would have been treated adequately with cefdinir x 10 days And reviewing his chart it does seem that he tends to have severe sore throat symptoms with viral illnesses, he did have a negative strep test per urgent care Supportive care, time, ibuprofen, Tylenol and some steroids will likely  help improve his symptoms I did explain that steroids would give short-term relief If he continues to have symptoms beyond another week did encourage him again to come into office to be examined   4. Acute cough Some brief coughing fits during the exam without any respiratory distress or increased work of breathing he denies any pain with breathing any shortness of breath fever sweats chills or severe fatigue Encouraged him to restart Mucinex, he can try Tessalon Perles - predniSONE (DELTASONE) 20 MG tablet; 3 tabs poqday 1-3, 2 tabs poqday 4-6, 1 tab poqday 7-9  Dispense: 18 tablet; Refill: 0 - benzonatate (TESSALON) 100 MG capsule; Take 1-2 capsules (100-200 mg total) by mouth 3 (three) times daily as needed for cough.  Dispense: 30 capsule; Refill: 1    -Red flags and when to present for emergency care or RTC including fever >101.31F, chest pain, shortness of breath, new/worsening/un-resolving symptoms, reviewed with patient at time of visit. Follow up and care instructions discussed and provided in  AVS. - I discussed the assessment and treatment plan with the patient. The patient was provided an opportunity to ask questions and all were answered. The patient agreed with the plan and demonstrated an understanding of the instructions.  I provided 25+ minutes of non-face-to-face time during this encounter.  Danelle Berry, PA-C 05/12/23 3:01 PM

## 2023-05-18 ENCOUNTER — Other Ambulatory Visit: Payer: Self-pay | Admitting: Family Medicine

## 2023-05-18 DIAGNOSIS — F5104 Psychophysiologic insomnia: Secondary | ICD-10-CM

## 2023-05-22 ENCOUNTER — Ambulatory Visit: Payer: 59 | Admitting: Family Medicine

## 2023-05-22 ENCOUNTER — Encounter: Payer: Self-pay | Admitting: Family Medicine

## 2023-05-22 VITALS — BP 124/72 | HR 64 | Temp 97.7°F | Resp 16 | Ht 70.0 in | Wt 222.0 lb

## 2023-05-22 DIAGNOSIS — J029 Acute pharyngitis, unspecified: Secondary | ICD-10-CM | POA: Diagnosis not present

## 2023-05-22 LAB — POCT RAPID STREP A (OFFICE): Rapid Strep A Screen: NEGATIVE

## 2023-05-22 NOTE — Progress Notes (Signed)
Patient ID: Gregory Matthews, male    DOB: 1980-06-18, 43 y.o.   MRN: [id number]  PCP: Alba Cory, MD  Chief Complaint  Patient presents with   Sore Throat    x2 weeks   Otitis Media    R, x4 weeks    Subjective:   Gregory Matthews is a 43 y.o. male, presents to clinic with CC of the following:  HPI  First ill the beginning of the month around 1/3 and then he went to UC a few days later with sore throat, fever, chills, sweats, ear pain At UC he was dx with AOM and tx with abx. He presented for virtual appt on 1/17 still not feeling well - he had just finished abx at time of virtual He was tx with prednisone x 9 d, just finished and is also feeling very ill again Fever only the first couple days of illness at beginning of month, no fever since Worse sx are pain in throat, swelling, ear pain, bloody discharge in musus from blowing nose He notes he also stopped all allergy/sinus meds as soon as he was feeling better  No recent fever, chills, sweats, HA, rash no facial pain No choking on food, no lymphadenopathy  Patient Active Problem List   Diagnosis Date Noted   Major depression in remission (HCC) 04/14/2021   B12 deficiency 04/14/2021   Vitamin D deficiency 04/14/2021   GAD (generalized anxiety disorder) 04/14/2021   Headache, migraine 06/08/2015   Obesity (BMI 30.0-34.9) 06/08/2015   Allergic rhinitis, seasonal 07/26/2013      Current Outpatient Medications:    amphetamine-dextroamphetamine (ADDERALL) 10 MG tablet, Take 1 tablet (10 mg total) by mouth 2 (two) times daily., Disp: 180 tablet, Rfl: 0   benzonatate (TESSALON) 100 MG capsule, Take 1-2 capsules (100-200 mg total) by mouth 3 (three) times daily as needed for cough., Disp: 30 capsule, Rfl: 1   Daridorexant HCl (QUVIVIQ) 50 MG TABS, TAKE 1 TABLET(50 MG) BY MOUTH AT BEDTIME FOR SLEEP, Disp: 90 tablet, Rfl: 0   escitalopram (LEXAPRO) 10 MG tablet, Take 1 tablet (10 mg total) by mouth daily., Disp: 90  tablet, Rfl: 1   lidocaine (XYLOCAINE) 2 % solution, Use as directed 15 mLs in the mouth or throat as needed., Disp: 100 mL, Rfl: 0   metoprolol succinate (TOPROL-XL) 25 MG 24 hr tablet, Take 0.5 tablets (12.5 mg total) by mouth in the morning and at bedtime. Take with or immediately following a meal., Disp: 90 tablet, Rfl: 3   mometasone (NASONEX) 50 MCG/ACT nasal spray, Place 2 sprays into the nose daily., Disp: 17 g, Rfl: 12   Allergies  Allergen Reactions   Elavil [Amitriptyline] Anxiety   Penicillins Rash     Social History   Tobacco Use   Smoking status: Never    Passive exposure: Past   Smokeless tobacco: Never  Vaping Use   Vaping status: Never Used  Substance Use Topics   Alcohol use: No    Alcohol/week: 0.0 standard drinks of alcohol   Drug use: No      Chart Review Today: I personally reviewed active problem list, medication list, allergies, family history, social history, health maintenance, notes from last encounter, lab results, imaging with the patient/caregiver today.   Review of Systems  Constitutional: Negative.   HENT: Negative.    Eyes: Negative.   Respiratory: Negative.    Cardiovascular: Negative.   Gastrointestinal: Negative.   Endocrine: Negative.   Genitourinary: Negative.  Musculoskeletal: Negative.   Skin: Negative.   Allergic/Immunologic: Negative.   Neurological: Negative.   Hematological: Negative.   Psychiatric/Behavioral: Negative.    All other systems reviewed and are negative.      Objective:   Vitals:   05/22/23 0907  BP: 124/72  Pulse: 64  Resp: 16  Temp: 97.7 F (36.5 C)  TempSrc: Oral  SpO2: 97%  Weight: 222 lb (100.7 kg)  Height: 5\' 10"  (1.778 m)    Body mass index is 31.85 kg/m.  Physical Exam Vitals and nursing note reviewed.  Constitutional:      General: He is not in acute distress.    Appearance: Normal appearance. He is well-developed and well-groomed. He is obese. He is not ill-appearing,  toxic-appearing or diaphoretic.  HENT:     Head: Normocephalic and atraumatic. No right periorbital erythema or left periorbital erythema.     Jaw: There is normal jaw occlusion.     Salivary Glands: Right salivary gland is not diffusely enlarged or tender. Left salivary gland is not diffusely enlarged or tender.     Right Ear: Tympanic membrane, ear canal and external ear normal. No middle ear effusion. There is no impacted cerumen. Tympanic membrane is not injected.     Left Ear: Tympanic membrane, ear canal and external ear normal.  No middle ear effusion. There is no impacted cerumen. Tympanic membrane is not injected.     Nose: Mucosal edema, congestion and rhinorrhea present.     Right Turbinates: Swollen.     Left Turbinates: Swollen.     Right Sinus: No maxillary sinus tenderness or frontal sinus tenderness.     Left Sinus: No maxillary sinus tenderness or frontal sinus tenderness.     Mouth/Throat:     Mouth: Mucous membranes are moist.     Tongue: No lesions. Tongue does not deviate from midline.     Palate: No mass and lesions.     Pharynx: Oropharynx is clear. Uvula midline. Posterior oropharyngeal erythema (mild) present. No pharyngeal swelling, oropharyngeal exudate, uvula swelling or postnasal drip.     Tonsils: No tonsillar exudate or tonsillar abscesses.  Eyes:     General:        Right eye: No discharge.        Left eye: No discharge.     Conjunctiva/sclera: Conjunctivae normal.  Neck:     Trachea: Trachea and phonation normal. No tracheal deviation.  Cardiovascular:     Rate and Rhythm: Normal rate and regular rhythm.  Pulmonary:     Effort: Pulmonary effort is normal. No respiratory distress.     Breath sounds: Normal breath sounds. No stridor. No wheezing, rhonchi or rales.  Musculoskeletal:        General: Normal range of motion.     Cervical back: Normal range of motion and neck supple. No edema, erythema, rigidity or tenderness. Normal range of motion.   Lymphadenopathy:     Cervical: No cervical adenopathy.     Right cervical: No superficial, deep or posterior cervical adenopathy.    Left cervical: No superficial, deep or posterior cervical adenopathy.  Skin:    General: Skin is warm and dry.     Findings: No rash.  Neurological:     Mental Status: He is alert.     Motor: No abnormal muscle tone.     Coordination: Coordination normal.  Psychiatric:        Behavior: Behavior normal. Behavior is cooperative.      Results for orders  placed or performed in visit on 05/22/23  POCT rapid strep A   Collection Time: 05/22/23  9:16 AM  Result Value Ref Range   Rapid Strep A Screen Negative Negative       Assessment & Plan:   1. Sore throat (Primary) Pt with reoccuring or returning pharyngitis noted to be very severe It improved with abx at the beginning of the month but returned as soon as abx were complete, given steroids and similar response, sig improvement while on prednisone, but worsened over the last 1-2 d as soon as completed, he also stopped allergy and nasal meds OP only with mild injection Normal phonation No lymphadenopathy Very well appearing Nasal mucosa edematous and erythematous - advised to restart allergy meds and nasal sprays Will get labs WBC likely to be elevated due to steroids Continue over-the-counter analgesics like alternating Tylenol and ibuprofen for discomfort At this time I do not feel that repeating antibiotics or repeated steroids are appropriate Rapid strep again was negative a strep culture was obtained and Epstein-Barr virus antibody panel and basic labs The patient and his wife both got sick around the same time in the beginning of the month since likely to have started with a viral infection -unclear why he continues to have returning and severe symptoms  - POCT rapid strep A - Epstein-Barr virus VCA antibody panel - CBC with Differential/Platelet - COMPLETE METABOLIC PANEL WITH GFR -  Culture, Group A Strep      Danelle Berry, PA-C 05/22/23 9:22 AM

## 2023-05-24 LAB — COMPLETE METABOLIC PANEL WITH GFR
AG Ratio: 1.4 (calc) (ref 1.0–2.5)
ALT: 48 U/L — ABNORMAL HIGH (ref 9–46)
AST: 17 U/L (ref 10–40)
Albumin: 4.4 g/dL (ref 3.6–5.1)
Alkaline phosphatase (APISO): 41 U/L (ref 36–130)
BUN: 22 mg/dL (ref 7–25)
CO2: 29 mmol/L (ref 20–32)
Calcium: 9.3 mg/dL (ref 8.6–10.3)
Chloride: 100 mmol/L (ref 98–110)
Creat: 0.9 mg/dL (ref 0.60–1.29)
Globulin: 3.1 g/dL (ref 1.9–3.7)
Glucose, Bld: 78 mg/dL (ref 65–99)
Potassium: 4.3 mmol/L (ref 3.5–5.3)
Sodium: 138 mmol/L (ref 135–146)
Total Bilirubin: 0.3 mg/dL (ref 0.2–1.2)
Total Protein: 7.5 g/dL (ref 6.1–8.1)
eGFR: 109 mL/min/{1.73_m2} (ref >=60)

## 2023-05-24 LAB — CULTURE, GROUP A STREP
Micro Number: 16003908
SPECIMEN QUALITY:: ADEQUATE

## 2023-05-24 LAB — CBC WITH DIFFERENTIAL/PLATELET
Absolute Lymphocytes: 3986 {cells}/uL — ABNORMAL HIGH (ref 850–3900)
Absolute Monocytes: 975 {cells}/uL — ABNORMAL HIGH (ref 200–950)
Basophils Absolute: 53 {cells}/uL (ref 0–200)
Basophils Relative: 0.5 %
Eosinophils Absolute: 159 {cells}/uL (ref 15–500)
Eosinophils Relative: 1.5 %
HCT: 45.8 % (ref 38.5–50.0)
Hemoglobin: 15.6 g/dL (ref 13.2–17.1)
MCH: 30.2 pg (ref 27.0–33.0)
MCHC: 34.1 g/dL (ref 32.0–36.0)
MCV: 88.6 fL (ref 80.0–100.0)
MPV: 8.6 fL (ref 7.5–12.5)
Monocytes Relative: 9.2 %
Neutro Abs: 5427 {cells}/uL (ref 1500–7800)
Neutrophils Relative %: 51.2 %
Platelets: 395 10*3/uL (ref 140–400)
RBC: 5.17 10*6/uL (ref 4.20–5.80)
RDW: 12.2 % (ref 11.0–15.0)
Total Lymphocyte: 37.6 %
WBC: 10.6 10*3/uL (ref 3.8–10.8)

## 2023-05-24 LAB — EPSTEIN-BARR VIRUS VCA ANTIBODY PANEL
EBV NA IgG: 189 U/mL — ABNORMAL HIGH
EBV VCA IgG: 41.9 U/mL — ABNORMAL HIGH
EBV VCA IgM: <36 U/mL

## 2023-06-14 ENCOUNTER — Encounter: Payer: Self-pay | Admitting: Family Medicine

## 2023-06-14 ENCOUNTER — Telehealth: Payer: 59 | Admitting: Family Medicine

## 2023-06-14 VITALS — BP 122/82 | HR 112 | Temp 100.0°F | Resp 16 | Ht 70.0 in | Wt 228.0 lb

## 2023-06-14 DIAGNOSIS — J069 Acute upper respiratory infection, unspecified: Secondary | ICD-10-CM

## 2023-06-14 DIAGNOSIS — R509 Fever, unspecified: Secondary | ICD-10-CM

## 2023-06-14 LAB — POC COVID19/FLU A&B COMBO
Covid Antigen, POC: NEGATIVE
Influenza A Antigen, POC: NEGATIVE
Influenza B Antigen, POC: NEGATIVE

## 2023-06-14 LAB — POCT RAPID STREP A (OFFICE): Rapid Strep A Screen: NEGATIVE

## 2023-06-14 NOTE — Progress Notes (Signed)
 Name: Gregory Matthews   MRN: [id number]    DOB: Sep 10, 1980   Date:06/14/2023       Progress Note  Subjective:    Chief Complaint  Chief Complaint  Patient presents with   Sore Throat    X2 days   Headache   Fever    Per pt 102.81F    I connected with  Gregory Matthews  on 06/14/23 at  9:40 AM EST by a video enabled telemedicine application and verified that I am speaking with the correct person using two identifiers.  I discussed the limitations of evaluation and management by telemedicine and the availability of in person appointments. The patient expressed understanding and agreed to proceed. Staff also discussed with the patient that there may be a patient responsible charge related to this service. Patient Location: home - will come into office Provider Location: [facility] clinic office Additional Individuals present:     Pt with scratchy throat drainage 2 d ago and then fever body aches fever HA onset yesterday  Fever in the last 24 hours 102.6 No cough, CP , SOB Tried dayquil, nyquil, hot tea Recurrent URI and severe sore throat for the past 2-3 months with hx of allergies, he is not currently taking any of his allergy meds or nose sprays No known sick contacts   Patient Active Problem List   Diagnosis Date Noted   Major depression in remission (HCC) 04/14/2021   B12 deficiency 04/14/2021   Vitamin D deficiency 04/14/2021   GAD (generalized anxiety disorder) 04/14/2021   Headache, migraine 06/08/2015   Obesity (BMI 30.0-34.9) 06/08/2015   Allergic rhinitis, seasonal 07/26/2013    Social History   Tobacco Use   Smoking status: Never    Passive exposure: Past   Smokeless tobacco: Never  Substance Use Topics   Alcohol use: No    Alcohol/week: 0.0 standard drinks of alcohol     Current Outpatient Medications:    amphetamine-dextroamphetamine (ADDERALL) 10 MG tablet, Take 1 tablet (10 mg total) by mouth 2 (two) times daily., Disp: 180 tablet, Rfl: 0    Daridorexant HCl (QUVIVIQ) 50 MG TABS, TAKE 1 TABLET(50 MG) BY MOUTH AT BEDTIME FOR SLEEP, Disp: 90 tablet, Rfl: 0   escitalopram (LEXAPRO) 10 MG tablet, Take 1 tablet (10 mg total) by mouth daily., Disp: 90 tablet, Rfl: 1   lidocaine (XYLOCAINE) 2 % solution, Use as directed 15 mLs in the mouth or throat as needed., Disp: 100 mL, Rfl: 0   metoprolol succinate (TOPROL-XL) 25 MG 24 hr tablet, Take 0.5 tablets (12.5 mg total) by mouth in the morning and at bedtime. Take with or immediately following a meal., Disp: 90 tablet, Rfl: 3   mometasone (NASONEX) 50 MCG/ACT nasal spray, Place 2 sprays into the nose daily., Disp: 17 g, Rfl: 12   benzonatate (TESSALON) 100 MG capsule, Take 1-2 capsules (100-200 mg total) by mouth 3 (three) times daily as needed for cough. (Patient not taking: Reported on 06/14/2023), Disp: 30 capsule, Rfl: 1  Allergies  Allergen Reactions   Elavil [Amitriptyline] Anxiety   Penicillins Rash    I personally reviewed active problem list, medication list, allergies, family history, social history, health maintenance, notes from last encounter, lab results, imaging with the patient/caregiver today.   Review of Systems  Constitutional:  Positive for fever.  HENT: Negative.    Eyes: Negative.   Respiratory: Negative.    Cardiovascular: Negative.   Gastrointestinal: Negative.   Endocrine: Negative.   Genitourinary: Negative.  Musculoskeletal: Negative.   Skin: Negative.   Allergic/Immunologic: Negative.   Neurological:  Positive for headaches.  Hematological: Negative.   Psychiatric/Behavioral: Negative.    All other systems reviewed and are negative.     Objective:   Virtual encounter, vitals limited, only able to obtain the following There were no vitals filed for this visit. There is no height or weight on file to calculate BMI. Nursing Note and Vital Signs reviewed.  Physical Exam Vitals and nursing note reviewed.  Constitutional:      General: He is not  in acute distress.    Appearance: Normal appearance. He is not ill-appearing, toxic-appearing or diaphoretic.  Pulmonary:     Effort: No respiratory distress.     PE limited by virtual encounter  No results found for this or any previous visit (from the past 72 hours).  Assessment and Plan:     ICD-10-CM   1. Febrile illness  R50.9    onset in the last 24 hours, no known exposure, pt will come into office for testing    2. Upper respiratory tract infection, unspecified type  J06.9    drainage, scratchy throat followed by fever/body aches    Supportive and symptomatic care and measures reviewed Encouraged him to restart daily allergy rhinosinusitis meds since his scratchy and sore throat recurrent for the past 2+ months very much seem to be related to post-nasal drip and improve when he takes meds regularly, he stops meds once he is well and then symptoms return (have at least 2-3 x)   Testing if office was negative -  Results for orders placed or performed in visit on 06/14/23  POCT rapid strep A   Collection Time: 06/14/23 10:23 AM  Result Value Ref Range   Rapid Strep A Screen Negative Negative  POC Covid19/Flu A&B Antigen   Collection Time: 06/14/23 10:23 AM  Result Value Ref Range   Influenza A Antigen, POC Negative Negative   Influenza B Antigen, POC Negative Negative   Covid Antigen, POC Negative Negative  He may want to retest for flu/covid  Supportive/sx measures recommended - msg sent to him through mychart    -Red flags and when to present for emergency care or RTC including chest pain, shortness of breath, new/worsening/un-resolving symptoms, reviewed with patient at time of visit. Follow up and care instructions discussed and provided in AVS. - I discussed the assessment and treatment plan with the patient. The patient was provided an opportunity to ask questions and all were answered. The patient agreed with the plan and demonstrated an understanding of the  instructions.  I provided 15 minutes of non-face-to-face time during this encounter.  Danelle Berry, PA-C 06/14/23 9:47 AM

## 2023-06-22 ENCOUNTER — Ambulatory Visit: Payer: 59 | Attending: Cardiology | Admitting: Cardiology

## 2023-06-22 VITALS — BP 110/60 | HR 56 | Ht 70.0 in | Wt 212.0 lb

## 2023-06-22 DIAGNOSIS — R072 Precordial pain: Secondary | ICD-10-CM

## 2023-06-22 DIAGNOSIS — E7800 Pure hypercholesterolemia, unspecified: Secondary | ICD-10-CM | POA: Diagnosis not present

## 2023-06-22 MED ORDER — METOPROLOL SUCCINATE ER 25 MG PO TB24: 25.0000 mg | ORAL_TABLET | Freq: Every day | ORAL | 3 refills | Status: AC

## 2023-06-22 NOTE — Patient Instructions (Signed)
 Medication Instructions:  - Your physician recommends that you continue on your current medications as directed. Please refer to the Current Medication list given to you today.  *If you need a refill on your cardiac medications before your next appointment, please call your pharmacy*   Lab Work: - none ordered  If you have labs (blood work) drawn today and your tests are completely normal, you will receive your results only by: MyChart Message (if you have MyChart) OR A paper copy in the mail If you have any lab test that is abnormal or we need to change your treatment, we will call you to review the results.   Testing/Procedures: - none ordered   Follow-Up: At [facility], you and your health needs are our priority.  As part of our continuing mission to provide you with exceptional heart care, we have created designated Provider Care Teams.  These Care Teams include your primary Cardiologist (physician) and Advanced Practice Providers (APPs -  Physician Assistants and Nurse Practitioners) who all work together to provide you with the care you need, when you need it.  We recommend signing up for the patient portal called "MyChart".  Sign up information is provided on this After Visit Summary.  MyChart is used to connect with patients for Virtual Visits (Telemedicine).  Patients are able to view lab/test results, encounter notes, upcoming appointments, etc.  Non-urgent messages can be sent to your provider as well.   To learn more about what you can do with MyChart, go to [email or web address].    Your next appointment:   As needed   Provider:   Debbe Odea, MD    Other Instructions N/a

## 2023-06-22 NOTE — Progress Notes (Signed)
 Cardiology Office Note:    Date:  06/22/2023   ID:  Gregory Matthews, DOB May 04, 1980, MRN [id number]  PCP:  Gregory Cory, MD   [facility] Providers Cardiologist:  Gregory Odea, MD     Referring MD: Gregory Cory, MD   No chief complaint on file.   History of Present Illness:    Gregory Matthews is a 43 y.o. male with a hx of migraines, hyperlipidemia, depression who presents for follow-up.  Previously seen with symptoms of chest pain, improved with beta-blocker.  Did not tolerate nitrates or Imdur.  Currently takes Toprol-XL 25 mg daily.  States feeling well, has no concerns at this time.  Prior notes Echo 05/2022 EF 60 to 65%, normal diastolic function Coronary CT 08/2839 no CAD. Not on Imdur due to headaches. Father had a heart attack with stent placement in his early 80s.   Past Medical History:  Diagnosis Date   Acute pharyngitis    ADHD    Depression    Hay fever    Migraine    Sinusitis, acute     Past Surgical History:  Procedure Laterality Date   HAIR TRANSPLANT     RHINOPLASTY     TONSILLECTOMY      Current Medications: Current Meds  Medication Sig   amphetamine-dextroamphetamine (ADDERALL) 10 MG tablet Take 1 tablet (10 mg total) by mouth 2 (two) times daily.   Daridorexant HCl (QUVIVIQ) 50 MG TABS TAKE 1 TABLET(50 MG) BY MOUTH AT BEDTIME FOR SLEEP   escitalopram (LEXAPRO) 10 MG tablet Take 1 tablet (10 mg total) by mouth daily.   [DISCONTINUED] metoprolol succinate (TOPROL-XL) 25 MG 24 hr tablet Take 0.5 tablets (12.5 mg total) by mouth in the morning and at bedtime. Take with or immediately following a meal.   [DISCONTINUED] metoprolol succinate (TOPROL-XL) 25 MG 24 hr tablet Take 25 mg by mouth daily.     Allergies:   Elavil [amitriptyline] and Penicillins   Social History   Socioeconomic History   Marital status: Married    Spouse name: Gregory Matthews    Number of children: 0   Years of education: Not on file   Highest  education level: Bachelor's degree (e.g., BA, AB, BS)  Occupational History   Occupation: Runner, broadcasting/film/video   Tobacco Use   Smoking status: Never    Passive exposure: Past   Smokeless tobacco: Never  Vaping Use   Vaping status: Never Used  Substance and Sexual Activity   Alcohol use: No    Alcohol/week: 0.0 standard drinks of alcohol   Drug use: No   Sexual activity: Not on file  Other Topics Concern   Not on file  Social History Narrative   Married, he is a Runner, broadcasting/film/video   Social Drivers of Corporate investment banker Strain: Low Risk  (12/29/2022)   Overall Financial Resource Strain (CARDIA)    Difficulty of Paying Living Expenses: Not hard at all  Food Insecurity: No Food Insecurity (12/29/2022)   Hunger Vital Sign    Worried About Running Out of Food in the Last Year: Never true    Ran Out of Food in the Last Year: Never true  Transportation Needs: No Transportation Needs (12/29/2022)   PRAPARE - Administrator, Civil Service (Medical): No    Lack of Transportation (Non-Medical): No  Physical Activity: Insufficiently Active (12/29/2022)   Exercise Vital Sign    Days of Exercise per Week: 2 days    Minutes of Exercise per Session: 20  min  Stress: No Stress Concern Present (12/29/2022)   [employer] of Occupational Health - Occupational Stress Questionnaire    Feeling of Stress : Only a little  Social Connections: Socially Integrated (12/29/2022)   Social Connection and Isolation Panel [NHANES]    Frequency of Communication with Friends and Family: More than three times a week    Frequency of Social Gatherings with Friends and Family: More than three times a week    Attends Religious Services: More than 4 times per year    Active Member of [employer] or Organizations: Yes    Attends Engineer, structural: More than 4 times per year    Marital Status: Married     Family History: The patient's family history includes Cancer in his maternal aunt and mother; Heart attack in  his father, maternal grandfather, and paternal grandfather; Heart disease in his father; Hyperlipidemia in his maternal grandmother; Hypertension in his father; Parkinson's disease in his paternal grandfather.  ROS:   Please see the history of present illness.     All other systems reviewed and are negative.  EKGs/Labs/Other Studies Reviewed:    The following studies were reviewed today:   EKG Interpretation Date/Time:  Thursday June 22 2023 08:54:24 EST Ventricular Rate:  56 PR Interval:  176 QRS Duration:  82 QT Interval:  422 QTC Calculation: 407 R Axis:   13  Text Interpretation: Sinus bradycardia Confirmed by Gregory Matthews ([id number]) on 06/22/2023 9:19:09 AM    Recent Labs: 05/22/2023: ALT 48; BUN 22; Creat 0.90; Hemoglobin 15.6; Platelets 395; Potassium 4.3; Sodium 138  Recent Lipid Panel    Component Value Date/Time   CHOL 223 (H) 11/03/2022 0945   TRIG 100 11/03/2022 0945   HDL 51 11/03/2022 0945   CHOLHDL 4.4 11/03/2022 0945   VLDL 21 12/08/2015 1003   LDLCALC 151 (H) 11/03/2022 0945     Risk Assessment/Calculations:             Physical Exam:    VS:  BP 110/60 (BP Location: Left Arm, Patient Position: Sitting, Cuff Size: Normal)   Pulse (!) 56   Ht 5\' 10"  (1.778 m)   Wt 212 lb (96.2 kg)   SpO2 98%   BMI 30.42 kg/m     Wt Readings from Last 3 Encounters:  06/22/23 212 lb (96.2 kg)  06/14/23 228 lb (103.4 kg)  05/22/23 222 lb (100.7 kg)     GEN:  Well nourished, well developed in no acute distress HEENT: Normal NECK: No JVD; No carotid bruits CARDIAC: RRR, no murmurs, rubs, gallops RESPIRATORY:  Clear to auscultation without rales, wheezing or rhonchi  ABDOMEN: Soft, non-tender, non-distended MUSCULOSKELETAL:  No edema; No deformity  SKIN: Warm and dry NEUROLOGIC:  Alert and oriented x 3 PSYCHIATRIC:  Normal affect   ASSESSMENT:    1. Precordial pain   2. Pure hypercholesterolemia    PLAN:    In order of problems listed  above:  Chest pain, echo with normal EF, coronary CT with no CAD.  Resolved with beta-blocker.  Continue Toprol-XL 25 mg daily.  Hyperlipidemia, continue low-cholesterol diet.  Not in statin benefit group.  Follow-up as needed.     Medication Adjustments/Labs and Tests Ordered: Current medicines are reviewed at length with the patient today.  Concerns regarding medicines are outlined above.  Orders Placed This Encounter  Procedures   EKG 12-Lead   Meds ordered this encounter  Medications   metoprolol succinate (TOPROL-XL) 25 MG 24 hr tablet  Sig: Take 1 tablet (25 mg total) by mouth daily.    Dispense:  90 tablet    Refill:  3    Patient Instructions  Medication Instructions:  - Your physician recommends that you continue on your current medications as directed. Please refer to the Current Medication list given to you today.  *If you need a refill on your cardiac medications before your next appointment, please call your pharmacy*   Lab Work: - none ordered  If you have labs (blood work) drawn today and your tests are completely normal, you will receive your results only by: MyChart Message (if you have MyChart) OR A paper copy in the mail If you have any lab test that is abnormal or we need to change your treatment, we will call you to review the results.   Testing/Procedures: - none ordered   Follow-Up: At [facility], you and your health needs are our priority.  As part of our continuing mission to provide you with exceptional heart care, we have created designated Provider Care Teams.  These Care Teams include your primary Cardiologist (physician) and Advanced Practice Providers (APPs -  Physician Assistants and Nurse Practitioners) who all work together to provide you with the care you need, when you need it.  We recommend signing up for the patient portal called "MyChart".  Sign up information is provided on this After Visit Summary.  MyChart is used to  connect with patients for Virtual Visits (Telemedicine).  Patients are able to view lab/test results, encounter notes, upcoming appointments, etc.  Non-urgent messages can be sent to your provider as well.   To learn more about what you can do with MyChart, go to [email or web address].    Your next appointment:   As needed   Provider:   Debbe Odea, MD    Other Instructions N/a       Signed, Gregory Odea, MD  06/22/2023 9:43 AM    [facility]

## 2023-07-07 ENCOUNTER — Ambulatory Visit: Payer: Self-pay | Admitting: Family Medicine

## 2023-07-12 ENCOUNTER — Ambulatory Visit (INDEPENDENT_AMBULATORY_CARE_PROVIDER_SITE_OTHER): Admitting: Family Medicine

## 2023-07-12 ENCOUNTER — Encounter: Payer: Self-pay | Admitting: Family Medicine

## 2023-07-12 VITALS — BP 118/68 | HR 69 | Resp 16 | Ht 70.0 in | Wt 220.1 lb

## 2023-07-12 DIAGNOSIS — J069 Acute upper respiratory infection, unspecified: Secondary | ICD-10-CM | POA: Diagnosis not present

## 2023-07-12 DIAGNOSIS — E785 Hyperlipidemia, unspecified: Secondary | ICD-10-CM

## 2023-07-12 DIAGNOSIS — F5104 Psychophysiologic insomnia: Secondary | ICD-10-CM

## 2023-07-12 DIAGNOSIS — G43009 Migraine without aura, not intractable, without status migrainosus: Secondary | ICD-10-CM | POA: Diagnosis not present

## 2023-07-12 DIAGNOSIS — F325 Major depressive disorder, single episode, in full remission: Secondary | ICD-10-CM

## 2023-07-12 DIAGNOSIS — R072 Precordial pain: Secondary | ICD-10-CM

## 2023-07-12 DIAGNOSIS — E538 Deficiency of other specified B group vitamins: Secondary | ICD-10-CM

## 2023-07-12 DIAGNOSIS — U071 COVID-19: Secondary | ICD-10-CM

## 2023-07-12 LAB — POC COVID19/FLU A&B COMBO
Covid Antigen, POC: POSITIVE — AB
Influenza A Antigen, POC: NEGATIVE
Influenza B Antigen, POC: NEGATIVE

## 2023-07-12 MED ORDER — QUVIVIQ 50 MG PO TABS: 1.0000 | ORAL_TABLET | Freq: Every day | ORAL | 2 refills | Status: DC

## 2023-07-12 MED ORDER — ESCITALOPRAM OXALATE 10 MG PO TABS: 10.0000 mg | ORAL_TABLET | Freq: Every day | ORAL | 1 refills | Status: DC

## 2023-07-12 MED ORDER — AMPHETAMINE-DEXTROAMPHETAMINE 10 MG PO TABS
10.0000 mg | ORAL_TABLET | Freq: Two times a day (BID) | ORAL | 0 refills | Status: DC
Start: 2023-07-12 — End: 2023-10-12

## 2023-07-12 MED ORDER — LIDOCAINE VISCOUS HCL 2 % MT SOLN: 15.0000 mL | OROMUCOSAL | 0 refills | Status: DC | PRN

## 2023-07-12 NOTE — Progress Notes (Signed)
 Name: Gregory Matthews   MRN: [id number]    DOB: 1981/01/18   Date:07/12/2023       Progress Note  Subjective  Chief Complaint  Chief Complaint  Patient presents with   Medical Management of Chronic Issues   HPI   MDD/GAD: diagnosed 05/2019 he currently only on prn visits to his therapist. 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 He enjoys working with his father, feeling excited about new ideas for the church and also flexible schedule  URI: he went to [place] over the weekend and returned on Monday having a scratch throat and now has head congestion and some post nasal drainage, no fever or chills. Appetite is normal. He is feeling more tired than usual. He also has a mild cough    Insomnia: he has tried Hydroxizine , Trazodone and Temazepam but either had side effects of inefective currently on Quviviq and has been able to fall and stay asleep, he states still has nights he does not sleep well, sometimes it happens 3 times a week.    Low back pain: currently doing okay , he takes Skelaxin prn. Unchanged    Migraine:We have tried Topamax and Imitrex. He has stopped both medication secondary to side effects. Topamax he stopped because of change in taste and Imitrex, because flushing sensation and chest tightness. Episodes of migraine are described as a dull  headache, followed by nausea and the pain intensifies and becomes throbbing or  pounding sensation in different parts of his head.  It is associated with photophobia, phonophobia and movement - such as walking  He stopped  Elavil due to side effects. He was on Depakote  for mood and migraine but he stopped medication .Episodes are down to two to three a month and takes Excedrin migraine prn    Dyslipidemia: also negative cardiac calcium score     The 10-year ASCVD risk score (Arnett DK, et al., 2019) is: 1.7%   Values used to calculate the score:     Age: 43 years     Sex: Male     Is Non-Hispanic African American: No     Diabetic: No     Tobacco smoker: No     Systolic Blood Pressure: 118 mmHg     Is BP treated: No     HDL Cholesterol: 51 mg/dL     Total Cholesterol: 223 mg/dL      N82 deficiency: taking SL B12 a few times a week   Vitamin D deficiency: continue daily vitamin D supplementation    Obesity:He has tried multiple diets. Tried Keto, personal trainers, [employer]. He joined a [employer] Loss program January 2024 , he was taking   herbal drops he also skipping  breakfast and  was following a low carbohydrate diet .  His  initial weight was 237 lbs , at one point it went down to  201.6 lbs Fall 2024 but since the holidays has not been as compliant and stopped the drops, but has resumed the regiment this week, hoping to get his weight down to around 200 lbs again    Precordial pain : went to [facility] 04/2019 , he was seen by Dr. Myriam Forehand on 01/15 and cardiac calcium score was negative , echocardiogram  showed normal EF.  He states episodes seems to be associated with tachycardia. He was seen by cardiologist. Jamal Maes Imdur but caused headache, he was given Toprol XL and has been taking one daily   Patient Active Problem List   Diagnosis Date Noted   Major depression in remission (HCC) 04/14/2021   B12 deficiency 04/14/2021   Vitamin D deficiency 04/14/2021   GAD (generalized anxiety disorder) 04/14/2021   Headache, migraine 06/08/2015   Obesity (BMI 30.0-34.9) 06/08/2015   Allergic rhinitis, seasonal 07/26/2013    Past Surgical History:  Procedure Laterality Date   HAIR TRANSPLANT     RHINOPLASTY     TONSILLECTOMY      Family History  Problem Relation Age of Onset   Cancer Mother        Breast   Hypertension Father    Heart disease Father    Heart attack Father    Cancer Maternal Aunt        Breast    Hyperlipidemia Maternal Grandmother    Heart attack Maternal Grandfather    Heart attack Paternal Grandfather    Parkinson's disease Paternal Grandfather     Social History   Tobacco Use   Smoking status: Never    Passive exposure: Past   Smokeless tobacco: Never  Substance Use Topics   Alcohol use: No    Alcohol/week: 0.0 standard drinks of alcohol     Current Outpatient Medications:    amphetamine-dextroamphetamine (ADDERALL) 10 MG tablet, Take 1 tablet (10 mg total) by mouth 2 (two) times daily., Disp: 180 tablet, Rfl: 0   Daridorexant HCl (QUVIVIQ) 50 MG TABS, TAKE 1 TABLET(50 MG) BY MOUTH AT BEDTIME FOR SLEEP, Disp: 90 tablet, Rfl: 0   escitalopram (LEXAPRO) 10 MG tablet, Take 1 tablet (10 mg total) by mouth daily., Disp: 90 tablet, Rfl: 1   metoprolol succinate (TOPROL-XL) 25 MG 24 hr tablet, Take 1 tablet (25 mg total) by mouth daily., Disp: 90 tablet, Rfl: 3   benzonatate (TESSALON) 100 MG capsule, Take 1-2 capsules (100-200 mg total) by mouth 3 (three) times daily as needed for cough. (Patient not taking: Reported on 07/12/2023), Disp: 30 capsule, Rfl: 1   lidocaine (XYLOCAINE) 2 % solution, Use as directed 15 mLs in the mouth or throat as needed. (Patient not taking: Reported on 07/12/2023), Disp: 100 mL, Rfl: 0   mometasone (NASONEX) 50 MCG/ACT nasal spray, Place 2 sprays into the nose daily. (Patient not taking: Reported on 07/12/2023), Disp: 17 g, Rfl: 12  Allergies  Allergen Reactions   Elavil [Amitriptyline] Anxiety   Penicillins Rash    I personally reviewed active problem list, medication list, allergies, family history with the patient/caregiver today.   ROS  Ten systems reviewed and is negative except as mentioned in HPI    Objective  Vitals:   07/12/23 1014  BP: 118/68  Pulse: 69  Resp: 16  SpO2: 99%  Weight: 220 lb 1.6 oz (99.8 kg)  Height: 5\' 10"  (1.778 m)    Body mass index is 31.58 kg/m.  Physical Exam  Constitutional: Patient appears  well-developed and well-nourished. Obese  No distress.  HEENT: head atraumatic, normocephalic, pupils equal and reactive to light, neck supple Cardiovascular: Normal rate, regular rhythm and normal heart sounds.  No murmur heard. No BLE edema. Pulmonary/Chest: Effort normal and breath sounds normal. No respiratory distress. Abdominal: Soft.  There is no tenderness. Psychiatric: Patient has a normal mood and affect. behavior is normal. Judgment and thought content normal.   Recent Results (from the past  2160 hours)  POCT rapid strep A     Status: None   Collection Time: 04/30/23 10:39 AM  Result Value Ref Range   Rapid Strep A Screen Negative Negative  POCT rapid strep A     Status: None   Collection Time: 05/22/23  9:16 AM  Result Value Ref Range   Rapid Strep A Screen Negative Negative  Epstein-Barr virus VCA antibody panel     Status: Abnormal   Collection Time: 05/22/23  9:46 AM  Result Value Ref Range   EBV VCA IgM <36.00 U/mL    Comment:       U/mL              Interpretation       ----              --------------       <36.00            Negative       36.00-43.99       Equivocal       >43.99            Positive    EBV VCA IgG 41.90 (H) U/mL    Comment:        U/mL             Interpretation        ----             --------------        <18.00           Negative        18.00-21.99      Equivocal        >21.99           Positive    EBV NA IgG 189.00 (H) U/mL    Comment:        U/mL             Interpretation        ----             --------------        <18.00           Negative        18.00-21.99      Equivocal        >21.99           Positive    Interpretation      Comment: . Suggestive of a past Epstein-Barr virus infection. In infants, a similar pattern may occur as a result of passive maternal transfer of antibody. Marland Kitchen   CBC with Differential/Platelet     Status: Abnormal   Collection Time: 05/22/23  9:46 AM  Result Value Ref Range   WBC 10.6 3.8 - 10.8  Thousand/uL   RBC 5.17 4.20 - 5.80 Million/uL   Hemoglobin 15.6 13.2 - 17.1 g/dL   HCT 86.5 78.4 - 69.6 %   MCV 88.6 80.0 - 100.0 fL   MCH 30.2 27.0 - 33.0 pg   MCHC 34.1 32.0 - 36.0 g/dL    Comment: For adults, a slight decrease in the calculated MCHC value (in the range of 30 to 32 g/dL) is most likely not clinically significant; however, it should be interpreted with caution in correlation with other red cell parameters and the patient's clinical condition.    RDW 12.2 11.0 - 15.0 %   Platelets 395 140 - 400 Thousand/uL   MPV 8.6 7.5 - 12.5 fL   Neutro  Abs 5,427 1,500 - 7,800 cells/uL   Absolute Lymphocytes 3,986 (H) 850 - 3,900 cells/uL   Absolute Monocytes 975 (H) 200 - 950 cells/uL   Eosinophils Absolute 159 15 - 500 cells/uL   Basophils Absolute 53 0 - 200 cells/uL   Neutrophils Relative % 51.2 %   Total Lymphocyte 37.6 %   Monocytes Relative 9.2 %   Eosinophils Relative 1.5 %   Basophils Relative 0.5 %  COMPLETE METABOLIC PANEL WITH GFR     Status: Abnormal   Collection Time: 05/22/23  9:46 AM  Result Value Ref Range   Glucose, Bld 78 65 - 99 mg/dL    Comment: .            Fasting reference interval .    BUN 22 7 - 25 mg/dL   Creat 1.61 0.96 - 0.45 mg/dL   eGFR 409 > OR = 60 WJ/XBJ/4.78G9   BUN/Creatinine Ratio SEE NOTE: 6 - 22 (calc)    Comment:    Not Reported: BUN and Creatinine are within    reference range. .    Sodium 138 135 - 146 mmol/L   Potassium 4.3 3.5 - 5.3 mmol/L   Chloride 100 98 - 110 mmol/L   CO2 29 20 - 32 mmol/L   Calcium 9.3 8.6 - 10.3 mg/dL   Total Protein 7.5 6.1 - 8.1 g/dL   Albumin 4.4 3.6 - 5.1 g/dL   Globulin 3.1 1.9 - 3.7 g/dL (calc)   AG Ratio 1.4 1.0 - 2.5 (calc)   Total Bilirubin 0.3 0.2 - 1.2 mg/dL   Alkaline phosphatase (APISO) 41 36 - 130 U/L   AST 17 10 - 40 U/L   ALT 48 (H) 9 - 46 U/L  Culture, Group A Strep     Status: None   Collection Time: 05/22/23  9:46 AM   Specimen: Throat  Result Value Ref Range   Micro  Number [id number]    SPECIMEN QUALITY: Adequate    SOURCE: THROAT    STATUS: FINAL    RESULT: No group A Streptococcus isolated   POCT rapid strep A     Status: None   Collection Time: 06/14/23 10:23 AM  Result Value Ref Range   Rapid Strep A Screen Negative Negative  POC Covid19/Flu A&B Antigen     Status: None   Collection Time: 06/14/23 10:23 AM  Result Value Ref Range   Influenza A Antigen, POC Negative Negative   Influenza B Antigen, POC Negative Negative   Covid Antigen, POC Negative Negative    Diabetic Foot Exam:     PHQ2/9:    07/12/2023   10:13 AM 06/14/2023    9:35 AM 03/09/2023    8:44 AM 02/03/2023    1:47 PM 12/29/2022   11:52 AM  Depression screen PHQ 2/9  Decreased Interest 0 0 0 0 0  Down, Depressed, Hopeless 0 0 0 0 0  PHQ - 2 Score 0 0 0 0 0  Altered sleeping 0 0 0 1 1  Tired, decreased energy 0 0 0 0 0  Change in appetite 0 0 0 0 0  Feeling bad or failure about yourself  0 0 0 0 0  Trouble concentrating 0 0 0 0 0  Moving slowly or fidgety/restless 0 0 0 0 0  Suicidal thoughts 0 0 0 0 0  PHQ-9 Score 0 0 0 1 1  Difficult doing work/chores Not difficult at all  Not difficult at all      phq  9 is negative  Fall Risk:    03/09/2023    8:44 AM 02/03/2023    1:47 PM 12/29/2022   11:44 AM 11/03/2022    9:03 AM 07/28/2022   10:17 AM  Fall Risk   Falls in the past year? 0 0 0 0 0  Number falls in past yr: 0 0 0  0  Injury with Fall? 0 0 0  0  Risk for fall due to : No Fall Risks No Fall Risks No Fall Risks No Fall Risks No Fall Risks  Follow up Falls prevention discussed;Education provided;Falls evaluation completed Falls prevention discussed Falls prevention discussed Falls prevention discussed Falls prevention discussed     Assessment & Plan  1. Major depression in remission (HCC)  - escitalopram (LEXAPRO) 10 MG tablet; Take 1 tablet (10 mg total) by mouth daily.  Dispense: 90 tablet; Refill: 1 - amphetamine-dextroamphetamine (ADDERALL) 10 MG  tablet; Take 1 tablet (10 mg total) by mouth 2 (two) times daily.  Dispense: 180 tablet; Refill: 0  2. Psychophysiological insomnia  - Daridorexant HCl (QUVIVIQ) 50 MG TABS; Take 1 tablet (50 mg total) by mouth daily at 12 noon.  Dispense: 30 tablet; Refill: 2  3. Viral upper respiratory tract infection (Primary)  - POC Covid19/Flu A&B Antigen - lidocaine (XYLOCAINE) 2 % solution; Use as directed 15 mLs in the mouth or throat as needed.  Dispense: 100 mL; Refill: 0    4. Migraine without aura and without status migrainosus, not intractable  Continue prn medication  5. Precordial chest pain  Released from cardiologist   6. Dyslipidemia  On life style modification  7. B12 deficiency   Continue supplements   8. COVID-19  Discussed zinc, fluids, rest, chicken noodle soup, otc medication, isolation for 5 days, wear a mask if symptoms after that

## 2023-08-08 ENCOUNTER — Other Ambulatory Visit: Payer: Self-pay | Admitting: Family Medicine

## 2023-08-08 DIAGNOSIS — F5104 Psychophysiologic insomnia: Secondary | ICD-10-CM

## 2023-10-12 ENCOUNTER — Encounter: Payer: Self-pay | Admitting: Family Medicine

## 2023-10-12 ENCOUNTER — Ambulatory Visit: Admitting: Family Medicine

## 2023-10-12 VITALS — BP 104/66 | HR 60 | Resp 16 | Ht 70.0 in | Wt 219.4 lb

## 2023-10-12 DIAGNOSIS — E785 Hyperlipidemia, unspecified: Secondary | ICD-10-CM | POA: Insufficient documentation

## 2023-10-12 DIAGNOSIS — E538 Deficiency of other specified B group vitamins: Secondary | ICD-10-CM

## 2023-10-12 DIAGNOSIS — G43009 Migraine without aura, not intractable, without status migrainosus: Secondary | ICD-10-CM

## 2023-10-12 DIAGNOSIS — K219 Gastro-esophageal reflux disease without esophagitis: Secondary | ICD-10-CM | POA: Insufficient documentation

## 2023-10-12 DIAGNOSIS — F325 Major depressive disorder, single episode, in full remission: Secondary | ICD-10-CM | POA: Diagnosis not present

## 2023-10-12 DIAGNOSIS — R072 Precordial pain: Secondary | ICD-10-CM | POA: Insufficient documentation

## 2023-10-12 DIAGNOSIS — F5104 Psychophysiologic insomnia: Secondary | ICD-10-CM

## 2023-10-12 DIAGNOSIS — F411 Generalized anxiety disorder: Secondary | ICD-10-CM

## 2023-10-12 DIAGNOSIS — E559 Vitamin D deficiency, unspecified: Secondary | ICD-10-CM

## 2023-10-12 MED ORDER — QUVIVIQ 50 MG PO TABS: 1.0000 | ORAL_TABLET | Freq: Every day | ORAL | 0 refills | Status: DC

## 2023-10-12 MED ORDER — AMPHETAMINE-DEXTROAMPHETAMINE 10 MG PO TABS: 10.0000 mg | ORAL_TABLET | Freq: Two times a day (BID) | ORAL | 0 refills | Status: DC

## 2023-10-12 MED ORDER — ZAVZPRET 10 MG/ACT NA SOLN: 1.0000 | Freq: Every day | NASAL | 1 refills | Status: AC | PRN

## 2023-10-12 MED ORDER — NURTEC 75 MG PO TBDP: 1.0000 | ORAL_TABLET | ORAL | 2 refills | Status: DC

## 2023-10-12 NOTE — Progress Notes (Unsigned)
 Name: Gregory Matthews   MRN: [id number]    DOB: 1980-08-25   Date:10/15/2023       Progress Note  Subjective  Chief Complaint  Chief Complaint  Patient presents with   Medical Management of Chronic Issues   MDD/GAD: diagnosed 05/2019 he currently only on prn visits to his therapist. His Phq 9 has been normal .He is off Depakote  but takes Lexapro  and Adderal daily , it helps with focus and motivation - very seldom takes twice daily but lately he has noticed difficulty staying focused in the afternoons again,and will start taking bid again   Insomnia: he has tried Hydroxizine , Trazodone  and Temazepam  but either had side effects of inefective currently on Quviviq  and has been able to fall and stay asleep and wants to continue medication  Migraine:We have tried Topamax  and Imitrex . He has stopped both medication secondary to side effects. Topamax  he stopped because of change in taste and Imitrex , because flushing sensation and chest tightness. Episodes of migraine are described as a dull  headache, followed by nausea and the pain intensifies and becomes throbbing or  pounding sensation in different parts of his head.  It is associated with photophobia, phonophobia and movement - such as walking  He stopped  Elavil  due to side effects. He was on Depakote   for mood and migraine but he also  stopped medication .Episodes are getting more frequent again and not subsiding right away , we will try Nurtec every other day for prevention and Zavzpret   tp take prn   Dyslipidemia: seen by cardiologist and  negative cardiac calcium score    Obesity:He has tried multiple diets. Tried Keto, personal trainers, [employer]. He joined a [employer] program January 2024 , he was taking  herbal drops he also skipping  breakfast and  was following a low carbohydrate diet .  His  initial weight was 237 lbs , at one point it went down to  201.6 lbs Fall 2024, he has not been as compliant lately with his diet  and weight is up again.    Precordial pain : went to [facility] 04/2019 , he was seen by Dr. Budd on 01/15 and cardiac calcium score was negative , echocardiogram  showed normal EF. He states episodes seems to be associated with tachycardia. He was seen by cardiologist. Lillis Imdur  but caused headache, he is still taking Toprol  XL   Patient Active Problem List   Diagnosis Date Noted   Gastroesophageal reflux disease without esophagitis 10/12/2023   Dyslipidemia 10/12/2023   Precordial chest pain 10/12/2023   Major depression in remission (HCC) 04/14/2021   B12 deficiency 04/14/2021   Vitamin D  deficiency 04/14/2021   GAD (generalized anxiety disorder) 04/14/2021   Headache, migraine 06/08/2015   Obesity (BMI 30.0-34.9) 06/08/2015   Allergic rhinitis, seasonal 07/26/2013    Past Surgical History:  Procedure Laterality Date   HAIR TRANSPLANT     RHINOPLASTY     TONSILLECTOMY      Family History  Problem Relation Age of Onset   Cancer Mother        Breast   Hypertension Father    Heart disease Father    Heart attack Father    Cancer Maternal Aunt        Breast   Hyperlipidemia Maternal Grandmother    Heart attack Maternal Grandfather    Heart attack Paternal Grandfather    Parkinson's disease Paternal Grandfather     Social History   Tobacco Use  Smoking status: Never    Passive exposure: Past   Smokeless tobacco: Never  Substance Use Topics   Alcohol use: No    Alcohol/week: 0.0 standard drinks of alcohol     Current Outpatient Medications:    Cholecalciferol (VITAMIN D3) 50 MCG (2000 UT) CAPS, Take 1 capsule by mouth daily., Disp: , Rfl:    cyanocobalamin (VITAMIN B12) 500 MCG tablet, Take 500 mcg by mouth daily., Disp: , Rfl:    escitalopram  (LEXAPRO ) 10 MG tablet, Take 1 tablet (10 mg total) by mouth daily., Disp: 90 tablet, Rfl: 1   metoprolol  succinate (TOPROL -XL) 25 MG 24 hr tablet, Take 1 tablet (25 mg total) by mouth daily., Disp: 90 tablet, Rfl: 3    Rimegepant Sulfate (NURTEC) 75 MG TBDP, Take 1 tablet (75 mg total) by mouth every other day., Disp: 16 tablet, Rfl: 2   Zavegepant HCl (ZAVZPRET ) 10 MG/ACT SOLN, Place 1 each into the nose daily as needed., Disp: 6 each, Rfl: 1   amphetamine -dextroamphetamine  (ADDERALL) 10 MG tablet, Take 1 tablet (10 mg total) by mouth 2 (two) times daily., Disp: 180 tablet, Rfl: 0   Daridorexant  HCl (QUVIVIQ ) 50 MG TABS, Take 1 tablet (50 mg total) by mouth at bedtime., Disp: 90 tablet, Rfl: 0  Allergies  Allergen Reactions   Elavil  [Amitriptyline ] Anxiety   Penicillins Rash    I personally reviewed active problem list, family and social history  with the patient/caregiver today.   ROS  Ten systems reviewed and is negative except as mentioned in HPI    Objective Physical Exam  Constitutional: Patient appears well-developed and well-nourished. Obese No distress.  HEENT: head atraumatic, normocephalic, pupils equal and reactive to light, neck supple Cardiovascular: Normal rate, regular rhythm and normal heart sounds.  No murmur heard. No BLE edema. Pulmonary/Chest: Effort normal and breath sounds normal. No respiratory distress. Abdominal: Soft.  There is no tenderness. Psychiatric: Patient has a normal mood and affect. behavior is normal. Judgment and thought content normal.   Vitals:   10/12/23 0919  BP: 104/66  Pulse: 60  Resp: 16  SpO2: 99%  Weight: 219 lb 6.4 oz (99.5 kg)  Height: 5' 10 (1.778 m)    Body mass index is 31.48 kg/m.   PHQ2/9:    10/12/2023    9:14 AM 07/12/2023   10:13 AM 06/14/2023    9:35 AM 03/09/2023    8:44 AM 02/03/2023    1:47 PM  Depression screen PHQ 2/9  Decreased Interest 0 0 0 0 0  Down, Depressed, Hopeless 0 0 0 0 0  PHQ - 2 Score 0 0 0 0 0  Altered sleeping 0 0 0 0 1  Tired, decreased energy 0 0 0 0 0  Change in appetite 0 0 0 0 0  Feeling bad or failure about yourself  0 0 0 0 0  Trouble concentrating 0 0 0 0 0  Moving slowly or  fidgety/restless 0 0 0 0 0  Suicidal thoughts 0 0 0 0 0  PHQ-9 Score 0 0 0 0 1  Difficult doing work/chores Not difficult at all Not difficult at all  Not difficult at all     phq 9 is negative  Fall Risk:    10/12/2023    9:14 AM 03/09/2023    8:44 AM 02/03/2023    1:47 PM 12/29/2022   11:44 AM 11/03/2022    9:03 AM  Fall Risk   Falls in the past year? 0 0 0 0 0  Number falls in past yr: 0 0 0 0   Injury with Fall? 0 0 0 0   Risk for fall due to : No Fall Risks No Fall Risks No Fall Risks No Fall Risks No Fall Risks  Follow up Falls prevention discussed;Education provided;Falls evaluation completed Falls prevention discussed;Education provided;Falls evaluation completed Falls prevention discussed Falls prevention discussed Falls prevention discussed    History of Present Illness  See above   Assessment & Plan  1. Precordial chest pain (Primary)  Seen by cardiologist, could not tolerate Imdur  due to headaches, studies negative   2. Major depression in remission [facility])  He is taking Lexapro , he has a therapist and also takes Adderal to improve focus and energy  - amphetamine -dextroamphetamine  (ADDERALL) 10 MG tablet; Take 1 tablet (10 mg total) by mouth 2 (two) times daily.  Dispense: 180 tablet; Refill: 0  3. Dyslipidemia  On life style modification  4. Migraine without aura and without status migrainosus, not intractable  - Rimegepant Sulfate (NURTEC) 75 MG TBDP; Take 1 tablet (75 mg total) by mouth every other day.  Dispense: 16 tablet; Refill: 2 - Zavegepant HCl (ZAVZPRET ) 10 MG/ACT SOLN; Place 1 each into the nose daily as needed.  Dispense: 6 each; Refill: 1  5. GAD (generalized anxiety disorder)  Doing well at this time, continue current regiment   6. Gastroesophageal reflux disease without esophagitis  Losing weight and symptoms improving  7. Psychophysiological insomnia  - Daridorexant  HCl (QUVIVIQ ) 50 MG TABS; Take 1 tablet (50 mg total) by mouth at  bedtime.  Dispense: 90 tablet; Refill: 0  8. B12 deficiency  Continue supplements   9. Vitamin D  deficiency  Continue supplements

## 2023-10-13 ENCOUNTER — Telehealth: Payer: Self-pay

## 2023-10-13 ENCOUNTER — Other Ambulatory Visit (HOSPITAL_COMMUNITY): Payer: Self-pay

## 2023-10-13 NOTE — Telephone Encounter (Signed)
 Pharmacy Patient Advocate Encounter   Received notification from Onbase that prior authorization for Zavzpret 10MG /ACT solution  is required/requested.   Insurance verification completed.   The patient is insured through [facility] .   Per test claim: PA required; PA submitted to above mentioned insurance via CoverMyMeds Key/confirmation #/EOC [id number] Status is pending

## 2023-10-13 NOTE — Telephone Encounter (Signed)
 Major Depression in remission diagnosis is not FDA approved for Amphetamine -Dextroamphetamine  10MG  tablets. Please consider changing therapies.

## 2023-10-16 ENCOUNTER — Other Ambulatory Visit (HOSPITAL_COMMUNITY): Payer: Self-pay

## 2023-10-16 NOTE — Telephone Encounter (Signed)
 Pharmacy Patient Advocate Encounter  Received notification from [facility] that Prior Authorization for Zavzpret  10MG /ACT solution has been DENIED.  Full denial letter will be uploaded to the media tab. See denial reason below.      PA #/Case ID/Reference #: [id number]

## 2024-01-03 ENCOUNTER — Ambulatory Visit: Attending: Internal Medicine | Admitting: Internal Medicine

## 2024-01-03 ENCOUNTER — Encounter: Payer: Self-pay | Admitting: Internal Medicine

## 2024-01-03 VITALS — BP 106/70 | HR 56 | Ht 70.0 in | Wt 216.2 lb

## 2024-01-03 DIAGNOSIS — R002 Palpitations: Secondary | ICD-10-CM | POA: Insufficient documentation

## 2024-01-03 NOTE — Patient Instructions (Signed)
 Medication Instructions:  Your physician recommends that you continue on your current medications as directed. Please refer to the Current Medication list given to you today.    *If you need a refill on your cardiac medications before your next appointment, please call your pharmacy*  Lab Work: No labs ordered today    Testing/Procedures: No test ordered today    Research scientist (physical sciences): Website: [email or web address]/  DR. LONNI RECOMMENDS YOU PURCHASE   [employer] FROM THE  [employer]  APP PLAY STORE.  THE APP IS FREE , BUT THE  EQUIPMENT HAS A COST. IT ALLOWS YOU TO OBTAIN A RECORDING OF YOUR HEART RATE AND RHYTHM BY PROVIDING A SHORT STRIP THAT YOU CAN SHARE WITH YOUR PROVIDER.     Follow-Up: At [facility], you and your health needs are our priority.  As part of our continuing mission to provide you with exceptional heart care, our providers are all part of one team.  This team includes your primary Cardiologist (physician) and Advanced Practice Providers or APPs (Physician Assistants and Nurse Practitioners) who all work together to provide you with the care you need, when you need it.  Your next appointment:   6 month(s)  Provider:   LONNI Hanson, MD

## 2024-01-03 NOTE — Progress Notes (Signed)
 Cardiology Office Note:  .   Date:  01/03/2024  ID:  Gregory Matthews, DOB Jun 07, 1980, MRN [id number] PCP: Gregory Mire, MD   HeartCare Providers Cardiologist:  Gregory Cave, MD     History of Present Illness: .   Gregory Matthews is a 43 y.o. male with history of hyperlipidemia and migraine headaches, seen for evaluation of irregular heartbeat.  He was previously seen by Dr. Cave as recently as 06/15/2023.  He had previously complained of chest pain that improved with addition of metoprolol .  Prior coronary CTA and echocardiogram in early 2024 were unrevealing.  Gregory Matthews presents today for second opinion regarding palpitations.  His first episode that brought him to see Dr. Cave in 04/2022 involved persistent racing of the heart associated with chest heaviness and numbness of the right arm.  Since starting metoprolol , he has not had any further episodes of this though he still has sporadic palpitations happening about once a month.  He describes them as a fast heartbeat lasting about 10 minutes.  There are no associated symptoms.  He denies chest pain and shortness of breath at other times.  Sometimes feels a little lightheaded that he attributes to low blood sugars.  He has not passed out.  He has tried capturing his heart rate with a smart watch during some of these episodes and notes that it is quite variable.  Gregory Matthews denies recent medication changes to explain his palpitations.  He has been on Lexapro  and Adderall for several years before onset of symptoms.  He typically consumes 1 caffeinated coffee and 1-2 sodas a day, which is stable.  He has lost around 30 pounds from his peak of 240 pounds and overall is feeling better.  ROS: See HPI  Studies Reviewed: Gregory Matthews   EKG Interpretation Date/Time:  Wednesday January 03 2024 09:12:31 EDT Ventricular Rate:  56 PR Interval:  166 QRS Duration:  78 QT Interval:  398 QTC Calculation: 384 R Axis:   26  Text  Interpretation: Sinus bradycardia Normal ECG When compared with ECG of 22-Jun-2023 08:54, No significant change was found Confirmed by Gregory Matthews [phone]) on 01/03/2024 9:54:16 AM    TTE (06/10/2022): Normal LV size and wall thickness.  LVEF 60-65% with normal wall motion and diastolic function.  GLS -16.1%.  Normal RV size and function.  Normal PA pressure.  Normal biatrial size.  No pericardial effusion.  Mild mitral regurgitation.  Otherwise, no significant valvular abnormality.  Normal CVP.  Coronary CTA (05/23/2022): Normal coronary arteries without stenosis or plaque.  Coronary calcium score 0.  No significant extracardiac findings. Risk Assessment/Calculations:             Physical Exam:   VS:  BP 106/70 (BP Location: Right Arm, Cuff Size: Normal)   Pulse (!) 56   Ht 5' 10 (1.778 m)   Wt 216 lb 4 oz (98.1 kg)   SpO2 98%   BMI 31.03 kg/m    Wt Readings from Last 3 Encounters:  01/03/24 216 lb 4 oz (98.1 kg)  10/12/23 219 lb 6.4 oz (99.5 kg)  07/12/23 220 lb 1.6 oz (99.8 kg)    General:  NAD. Neck: No JVD or HJR. Lungs: Clear to auscultation bilaterally without wheezes or crackles. Heart: Bradycardic but regular without murmurs, rubs, or gallops. Abdomen: Soft, nontender, nondistended. Extremities: No lower extremity edema.  ASSESSMENT AND PLAN: .    Palpitations: Gregory Matthews notes significant improvement in palpitations without recurrence of chest pain and right  arm numbness since being started on metoprolol  (he had previously taken Imdur  but did not tolerate this due to headaches).  Given that his palpitations are fairly infrequent (about once a month) and without associated symptoms, we have agreed to defer formal ambulatory cardiac monitoring.  I have encouraged him to consider using a smart watch or [employer] device that is able to capture a rhythm strip for further evaluation.  He is scheduled to see Dr. Glenard later this month for physical.  It may be helpful to  repeat a TSH at that time (last check in 2022 was normal).  I encouraged Gregory Matthews to minimize his caffeine intake.  If symptoms persist/worsen, it may be necessary to consider reducing or changing his Adderall.    Dispo: Return to clinic in 6 months.  Signed, Gregory Hanson, MD

## 2024-01-11 NOTE — Patient Instructions (Signed)

## 2024-01-12 ENCOUNTER — Other Ambulatory Visit: Payer: Self-pay

## 2024-01-12 ENCOUNTER — Ambulatory Visit (INDEPENDENT_AMBULATORY_CARE_PROVIDER_SITE_OTHER): Admitting: Family Medicine

## 2024-01-12 ENCOUNTER — Encounter: Payer: Self-pay | Admitting: Family Medicine

## 2024-01-12 VITALS — BP 112/74 | HR 63 | Resp 16 | Ht 68.5 in | Wt 217.2 lb

## 2024-01-12 DIAGNOSIS — E538 Deficiency of other specified B group vitamins: Secondary | ICD-10-CM | POA: Diagnosis not present

## 2024-01-12 DIAGNOSIS — E785 Hyperlipidemia, unspecified: Secondary | ICD-10-CM | POA: Diagnosis not present

## 2024-01-12 DIAGNOSIS — Z79899 Other long term (current) drug therapy: Secondary | ICD-10-CM

## 2024-01-12 DIAGNOSIS — Z0000 Encounter for general adult medical examination without abnormal findings: Secondary | ICD-10-CM | POA: Diagnosis not present

## 2024-01-12 DIAGNOSIS — Z131 Encounter for screening for diabetes mellitus: Secondary | ICD-10-CM

## 2024-01-12 DIAGNOSIS — Z1159 Encounter for screening for other viral diseases: Secondary | ICD-10-CM

## 2024-01-12 DIAGNOSIS — E559 Vitamin D deficiency, unspecified: Secondary | ICD-10-CM

## 2024-01-12 DIAGNOSIS — F5104 Psychophysiologic insomnia: Secondary | ICD-10-CM

## 2024-01-12 MED ORDER — QUVIVIQ 50 MG PO TABS: 1.0000 | ORAL_TABLET | Freq: Every day | ORAL | 0 refills | Status: DC

## 2024-01-12 NOTE — Progress Notes (Signed)
 Name: Gregory Matthews   MRN: [id number]    DOB: 02/22/1981   Date:01/12/2024       Progress Note  Subjective  Chief Complaint  Chief Complaint  Patient presents with   Annual Exam    HPI  Patient presents for annual CPE .   IPSS     Row Name 01/12/24 9188         International Prostate Symptom Score   How often have you had the sensation of not emptying your bladder? Not at All     How often have you had to urinate less than every two hours? Not at All     How often have you found you stopped and started again several times when you urinated? Not at All     How often have you found it difficult to postpone urination? Not at All     How often have you had a weak urinary stream? Not at All     How often have you had to strain to start urination? Not at All     How many times did you typically get up at night to urinate? 1 Time     Total IPSS Score 1       Quality of Life due to urinary symptoms   If you were to spend the rest of your life with your urinary condition just the way it is now how would you feel about that? Pleased        Diet: meal prepping at home on the weekends. Eating vegetables, fruit and lean meat - measures the intake  Exercise: walking dogs Last Dental Exam: up to date  Last Eye Exam: up to date  Depression: phq 9 is negative    01/12/2024    8:11 AM 10/12/2023    9:14 AM 07/12/2023   10:13 AM 06/14/2023    9:35 AM 03/09/2023    8:44 AM  Depression screen PHQ 2/9  Decreased Interest 0 0 0 0 0  Down, Depressed, Hopeless 0 0 0 0 0  PHQ - 2 Score 0 0 0 0 0  Altered sleeping 0 0 0 0 0  Tired, decreased energy 0 0 0 0 0  Change in appetite 0 0 0 0 0  Feeling bad or failure about yourself  0 0 0 0 0  Trouble concentrating 0 0 0 0 0  Moving slowly or fidgety/restless 0 0 0 0 0  Suicidal thoughts 0 0 0 0 0  PHQ-9 Score 0 0 0 0 0  Difficult doing work/chores Not difficult at all Not difficult at all Not difficult at all  Not difficult at all     Hypertension:  BP Readings from Last 3 Encounters:  01/12/24 112/74  01/03/24 106/70  10/12/23 104/66    Obesity: Wt Readings from Last 3 Encounters:  01/12/24 217 lb 3.2 oz (98.5 kg)  01/03/24 216 lb 4 oz (98.1 kg)  10/12/23 219 lb 6.4 oz (99.5 kg)   BMI Readings from Last 3 Encounters:  01/12/24 32.54 kg/m  01/03/24 31.03 kg/m  10/12/23 31.48 kg/m     [employer] Visit from 01/12/2024 in [facility]  AUDIT-C Score 0    Married STD testing and prevention (HIV/chl/gon/syphilis):  not applicable Sexual history: doing well  Hep C Screening: completed Skin cancer: Discussed monitoring for atypical lesions Colorectal cancer: due at age 3  Prostate cancer:  not applicable   Lung cancer:  Low Dose CT Chest  recommended if Age 13-80 years, 30 pack-year currently smoking OR have quit w/in 15years. Patient  is not a candidate for screening   AAA: The USPSTF recommends one-time screening with ultrasonography in men ages 49 to 75 years who have ever smoked. Patient   is not a candidate for screening  ECG:  2025  Vaccines: reviewed with the patient.   Advanced Care Planning: A voluntary discussion about advance care planning including the explanation and discussion of advance directives.  Discussed health care proxy and Living will, and the patient was able to identify a health care proxy as wife .  Patient does not know have a living will and power of attorney of health care   Patient Active Problem List   Diagnosis Date Noted   Palpitations 01/03/2024   Gastroesophageal reflux disease without esophagitis 10/12/2023   Dyslipidemia 10/12/2023   Precordial chest pain 10/12/2023   Major depression in remission (HCC) 04/14/2021   B12 deficiency 04/14/2021   Vitamin D  deficiency 04/14/2021   GAD (generalized anxiety disorder) 04/14/2021   Headache, migraine 06/08/2015   Obesity (BMI 30.0-34.9) 06/08/2015   Allergic rhinitis, seasonal  07/26/2013    Past Surgical History:  Procedure Laterality Date   HAIR TRANSPLANT     RHINOPLASTY     TONSILLECTOMY      Family History  Problem Relation Age of Onset   Cancer Mother        Breast   Hypertension Father    Heart disease Father    Heart attack Father    Cancer Maternal Aunt        Breast   Hyperlipidemia Maternal Grandmother    Heart attack Maternal Grandfather    Heart attack Paternal Grandfather    Parkinson's disease Paternal Grandfather     Social History   Socioeconomic History   Marital status: Married    Spouse name: Rosina    Number of children: 0   Years of education: Not on file   Highest education level: Bachelor's degree (e.g., BA, AB, BS)  Occupational History   Occupation: Runner, broadcasting/film/video   Tobacco Use   Smoking status: Never    Passive exposure: Past   Smokeless tobacco: Never  Vaping Use   Vaping status: Never Used  Substance and Sexual Activity   Alcohol use: No    Alcohol/week: 0.0 standard drinks of alcohol   Drug use: No   Sexual activity: Not on file  Other Topics Concern   Not on file  Social History Narrative   Married, he is a Runner, broadcasting/film/video   Social Drivers of Corporate investment banker Strain: Low Risk  (01/12/2024)   Overall Financial Resource Strain (CARDIA)    Difficulty of Paying Living Expenses: Not hard at all  Food Insecurity: No Food Insecurity (01/12/2024)   Hunger Vital Sign    Worried About Running Out of Food in the Last Year: Never true    Ran Out of Food in the Last Year: Never true  Transportation Needs: No Transportation Needs (01/12/2024)   PRAPARE - Administrator, Civil Service (Medical): No    Lack of Transportation (Non-Medical): No  Physical Activity: Insufficiently Active (01/12/2024)   Exercise Vital Sign    Days of Exercise per Week: 3 days    Minutes of Exercise per Session: 30 min  Stress: No Stress Concern Present (01/12/2024)   [employer] of Occupational Health - Occupational  Stress Questionnaire    Feeling of Stress: Not at all  Social Connections:  Socially Integrated (01/12/2024)   Social Connection and Isolation Panel    Frequency of Communication with Friends and Family: More than three times a week    Frequency of Social Gatherings with Friends and Family: More than three times a week    Attends Religious Services: More than 4 times per year    Active Member of [employer] or Organizations: Yes    Attends Engineer, structural: More than 4 times per year    Marital Status: Married  Catering manager Violence: Not At Risk (01/12/2024)   Humiliation, Afraid, Rape, and Kick questionnaire    Fear of Current or Ex-Partner: No    Emotionally Abused: No    Physically Abused: No    Sexually Abused: No     Current Outpatient Medications:    amphetamine -dextroamphetamine  (ADDERALL) 10 MG tablet, Take 1 tablet (10 mg total) by mouth 2 (two) times daily., Disp: 180 tablet, Rfl: 0   Cholecalciferol (VITAMIN D3) 50 MCG (2000 UT) CAPS, Take 1 capsule by mouth daily., Disp: , Rfl:    cyanocobalamin (VITAMIN B12) 500 MCG tablet, Take 500 mcg by mouth daily., Disp: , Rfl:    Daridorexant  HCl (QUVIVIQ ) 50 MG TABS, Take 1 tablet (50 mg total) by mouth at bedtime., Disp: 90 tablet, Rfl: 0   escitalopram  (LEXAPRO ) 10 MG tablet, Take 1 tablet (10 mg total) by mouth daily., Disp: 90 tablet, Rfl: 1   metoprolol  succinate (TOPROL -XL) 25 MG 24 hr tablet, Take 1 tablet (25 mg total) by mouth daily., Disp: 90 tablet, Rfl: 3   Rimegepant Sulfate (NURTEC) 75 MG TBDP, Take 1 tablet (75 mg total) by mouth every other day., Disp: 16 tablet, Rfl: 2   Zavegepant HCl (ZAVZPRET ) 10 MG/ACT SOLN, Place 1 each into the nose daily as needed., Disp: 6 each, Rfl: 1  Allergies  Allergen Reactions   Elavil  [Amitriptyline ] Anxiety   Penicillins Rash     ROS  Constitutional: Negative for fever or weight change.  Respiratory: Negative for cough and shortness of breath.   Cardiovascular:  Negative for chest pain or palpitations.  Gastrointestinal: Negative for abdominal pain, no bowel changes.  Musculoskeletal: Negative for gait problem or joint swelling.  Skin: Negative for rash.  Neurological: Negative for dizziness or headache.  No other specific complaints in a complete review of systems (except as listed in HPI above).    Objective  Vitals:   01/12/24 0814  BP: 112/74  Pulse: 63  Resp: 16  SpO2: 99%  Weight: 217 lb 3.2 oz (98.5 kg)  Height: 5' 8.5 (1.74 m)    Body mass index is 32.54 kg/m.  Physical Exam  Constitutional: Patient appears well-developed and well-nourished. No distress.  HENT: Head: Normocephalic and atraumatic. Ears: B TMs ok, no erythema or effusion; Nose: Nose normal. Mouth/Throat: Oropharynx is clear and moist. No oropharyngeal exudate.  Eyes: Conjunctivae and EOM are normal. Pupils are equal, round, and reactive to light. No scleral icterus.  Neck: Normal range of motion. Neck supple. No JVD present. No thyromegaly present.  Cardiovascular: Normal rate, regular rhythm and normal heart sounds.  No murmur heard. No BLE edema. Pulmonary/Chest: Effort normal and breath sounds normal. No respiratory distress. Abdominal: Soft. Bowel sounds are normal, no distension. There is no tenderness. no masses MALE GENITALIA: Normal descended testes bilaterally, no masses palpated, no hernias, no lesions, no discharge RECTAL: not done  Musculoskeletal: Normal range of motion, no joint effusions. No gross deformities Neurological: he is alert and oriented to person, place,  and time. No cranial nerve deficit. Coordination, balance, strength, speech and gait are normal.  Skin: Skin is warm and dry. No rash noted. No erythema.  Psychiatric: Patient has a normal mood and affect. behavior is normal. Judgment and thought content normal.     Assessment & Plan  1. Well adult exam (Primary)  - Hepatitis B Surface AntiBODY - CBC with Differential/Platelet -  Comprehensive metabolic panel with GFR - Lipid panel - VITAMIN D  25 Hydroxy (Vit-D Deficiency, Fractures) - B12 and Folate Panel  2. B12 deficiency  - B12 and Folate Panel  3. Vitamin D  deficiency  - VITAMIN D  25 Hydroxy (Vit-D Deficiency, Fractures)  4. Dyslipidemia  - Lipid panel  5. Long-term use of high-risk medication  - CBC with Differential/Platelet - Comprehensive metabolic panel with GFR  6. Need for hepatitis B screening test  - Hepatitis B Surface AntiBODY    -Prostate cancer screening and PSA options (with potential risks and benefits of testing vs not testing) were discussed along with recent recs/guidelines. -USPSTF grade A and B recommendations reviewed with patient; age-appropriate recommendations, preventive care, screening tests, etc discussed and encouraged; healthy living encouraged; see AVS for patient education given to patient -Discussed importance of 150 minutes of physical activity weekly, eat two servings of fish weekly, eat one serving of tree nuts ( cashews, pistachios, pecans, almonds.SABRA) every other day, eat 6 servings of fruit/vegetables daily and drink plenty of water and avoid sweet beverages.  -Reviewed Health Maintenance: yes

## 2024-01-12 NOTE — Addendum Note (Signed)
 Addended by: GLENARD MIRE F on: 01/12/2024 08:56 AM   Modules accepted: Orders

## 2024-01-13 LAB — COMPREHENSIVE METABOLIC PANEL WITH GFR
AG Ratio: 1.7 (calc) (ref 1.0–2.5)
ALT: 44 U/L (ref 9–46)
AST: 23 U/L (ref 10–40)
Albumin: 4.7 g/dL (ref 3.6–5.1)
Alkaline phosphatase (APISO): 42 U/L (ref 36–130)
BUN: 17 mg/dL (ref 7–25)
CO2: 26 mmol/L (ref 20–32)
Calcium: 9.2 mg/dL (ref 8.6–10.3)
Chloride: 104 mmol/L (ref 98–110)
Creat: 0.9 mg/dL (ref 0.60–1.29)
Globulin: 2.8 g/dL (ref 1.9–3.7)
Glucose, Bld: 93 mg/dL (ref 65–99)
Potassium: 4.1 mmol/L (ref 3.5–5.3)
Sodium: 139 mmol/L (ref 135–146)
Total Bilirubin: 0.3 mg/dL (ref 0.2–1.2)
Total Protein: 7.5 g/dL (ref 6.1–8.1)
eGFR: 108 mL/min/1.73m2 (ref >=60)

## 2024-01-13 LAB — CBC WITH DIFFERENTIAL/PLATELET
Absolute Lymphocytes: 2227 {cells}/uL (ref 850–3900)
Absolute Monocytes: 482 {cells}/uL (ref 200–950)
Basophils Absolute: 31 {cells}/uL (ref 0–200)
Basophils Relative: 0.5 %
Eosinophils Absolute: 159 {cells}/uL (ref 15–500)
Eosinophils Relative: 2.6 %
HCT: 45.5 % (ref 38.5–50.0)
Hemoglobin: 15.3 g/dL (ref 13.2–17.1)
MCH: 29.8 pg (ref 27.0–33.0)
MCHC: 33.6 g/dL (ref 32.0–36.0)
MCV: 88.5 fL (ref 80.0–100.0)
MPV: 8.8 fL (ref 7.5–12.5)
Monocytes Relative: 7.9 %
Neutro Abs: 3203 {cells}/uL (ref 1500–7800)
Neutrophils Relative %: 52.5 %
Platelets: 345 Thousand/uL (ref 140–400)
RBC: 5.14 Million/uL (ref 4.20–5.80)
RDW: 12.4 % (ref 11.0–15.0)
Total Lymphocyte: 36.5 %
WBC: 6.1 Thousand/uL (ref 3.8–10.8)

## 2024-01-13 LAB — LIPID PANEL
Cholesterol: 202 mg/dL — ABNORMAL HIGH (ref <200)
HDL: 47 mg/dL (ref >=40)
LDL Cholesterol (Calc): 130 mg/dL — ABNORMAL HIGH
Non-HDL Cholesterol (Calc): 155 mg/dL — ABNORMAL HIGH (ref <130)
Total CHOL/HDL Ratio: 4.3 (calc) (ref <5.0)
Triglycerides: 136 mg/dL (ref <150)

## 2024-01-13 LAB — B12 AND FOLATE PANEL
Folate: 12.9 ng/mL
Vitamin B-12: 391 pg/mL (ref 200–1100)

## 2024-01-13 LAB — VITAMIN D 25 HYDROXY (VIT D DEFICIENCY, FRACTURES): Vit D, 25-Hydroxy: 40 ng/mL (ref 30–100)

## 2024-01-13 LAB — HEPATITIS B SURFACE ANTIBODY,QUALITATIVE: Hep B S Ab: NONREACTIVE

## 2024-01-15 ENCOUNTER — Ambulatory Visit: Payer: Self-pay | Admitting: Family Medicine

## 2024-01-16 ENCOUNTER — Other Ambulatory Visit (HOSPITAL_COMMUNITY): Payer: Self-pay

## 2024-01-16 ENCOUNTER — Ambulatory Visit: Admitting: Family Medicine

## 2024-01-16 ENCOUNTER — Encounter: Payer: Self-pay | Admitting: Family Medicine

## 2024-01-16 ENCOUNTER — Telehealth: Payer: Self-pay | Admitting: Pharmacy Technician

## 2024-01-16 VITALS — BP 122/74 | HR 79 | Resp 16 | Ht 68.5 in | Wt 218.4 lb

## 2024-01-16 DIAGNOSIS — G43009 Migraine without aura, not intractable, without status migrainosus: Secondary | ICD-10-CM

## 2024-01-16 DIAGNOSIS — F411 Generalized anxiety disorder: Secondary | ICD-10-CM | POA: Diagnosis not present

## 2024-01-16 DIAGNOSIS — E538 Deficiency of other specified B group vitamins: Secondary | ICD-10-CM

## 2024-01-16 DIAGNOSIS — E785 Hyperlipidemia, unspecified: Secondary | ICD-10-CM

## 2024-01-16 DIAGNOSIS — E559 Vitamin D deficiency, unspecified: Secondary | ICD-10-CM

## 2024-01-16 DIAGNOSIS — F325 Major depressive disorder, single episode, in full remission: Secondary | ICD-10-CM

## 2024-01-16 DIAGNOSIS — F5104 Psychophysiologic insomnia: Secondary | ICD-10-CM

## 2024-01-16 DIAGNOSIS — Z23 Encounter for immunization: Secondary | ICD-10-CM

## 2024-01-16 DIAGNOSIS — K219 Gastro-esophageal reflux disease without esophagitis: Secondary | ICD-10-CM

## 2024-01-16 MED ORDER — AMPHETAMINE-DEXTROAMPHETAMINE 10 MG PO TABS: 10.0000 mg | ORAL_TABLET | Freq: Two times a day (BID) | ORAL | 0 refills | Status: DC

## 2024-01-16 MED ORDER — ESCITALOPRAM OXALATE 10 MG PO TABS: 10.0000 mg | ORAL_TABLET | Freq: Every day | ORAL | 1 refills | Status: AC

## 2024-01-16 MED ORDER — QUVIVIQ 50 MG PO TABS: 1.0000 | ORAL_TABLET | Freq: Every day | ORAL | 2 refills | Status: DC

## 2024-01-16 NOTE — Telephone Encounter (Signed)
 Pharmacy Patient Advocate Encounter   Received notification from Onbase that prior authorization for Quviviq  50MG  tablets is required/requested.   Insurance verification completed.   The patient is insured through [facility] .   Per test claim: PA required; PA submitted to above mentioned insurance via Latent Key/confirmation #/EOC [id number] Status is pending

## 2024-01-16 NOTE — Progress Notes (Signed)
 Name: Gregory Matthews   MRN: [id number]    DOB: 14-Nov-1980   Date:01/16/2024       Progress Note  Subjective  Chief Complaint  Chief Complaint  Patient presents with   Medical Management of Chronic Issues   Discussed the use of AI scribe software for clinical note transcription with the patient, who gave verbal consent to proceed.  History of Present Illness Gregory Matthews is a 43 year old male who presents for a follow-up visit and lab review.  He recently had blood work done on September 19th, which showed an increase in B12 levels from 316 to 391, with a goal of reaching 400. He is currently taking a B12 supplement that melts in the mouth. His folic acid levels are normal, and his hepatitis B surface antibody test indicated that he needs a hepatitis B vaccine. He received a flu shot today.  He is being treated for chronic insomnia with a medication referred to as 'Quviviq ', which he receives on a month-to-month basis due to insurance constraints. He feels much better regarding his anxiety and depression, attributing some improvement to a recent job shift. He is currently taking Lexapro  for anxiety and depression, which is in remission. He also takes Adderall, primarily started due to lack of motivation from depression. He takes Adderall daily, primarily in the morning, and sometimes skips the second dose and denies side effects.  He experiences migraine headaches, which have been managed with a nasal medication called Zapsprite, providing relief within 15 minutes. He also uses Nurtec as needed. He had a migraine about two months ago and experiences light and sound sensitivity, nausea, and sometimes vomiting during episodes. He takes metoprolol  daily.  He uses [facility] for most prescriptions, except for Adderall, which is filled at [facility]. He wears glasses due to astigmatism, which helps when his eyes are tired and unable to focus.    Patient Active Problem List   Diagnosis  Date Noted   Palpitations 01/03/2024   Gastroesophageal reflux disease without esophagitis 10/12/2023   Dyslipidemia 10/12/2023   Precordial chest pain 10/12/2023   Major depression in remission 04/14/2021   B12 deficiency 04/14/2021   Vitamin D  deficiency 04/14/2021   GAD (generalized anxiety disorder) 04/14/2021   Headache, migraine 06/08/2015   Obesity (BMI 30.0-34.9) 06/08/2015   Allergic rhinitis, seasonal 07/26/2013    Past Surgical History:  Procedure Laterality Date   HAIR TRANSPLANT     RHINOPLASTY     TONSILLECTOMY      Family History  Problem Relation Age of Onset   Cancer Mother        Breast   Hypertension Father    Heart disease Father    Heart attack Father    Cancer Maternal Aunt        Breast   Hyperlipidemia Maternal Grandmother    Heart attack Maternal Grandfather    Heart attack Paternal Grandfather    Parkinson's disease Paternal Grandfather     Social History   Tobacco Use   Smoking status: Never    Passive exposure: Past   Smokeless tobacco: Never  Substance Use Topics   Alcohol use: No    Alcohol/week: 0.0 standard drinks of alcohol     Current Outpatient Medications:    amphetamine -dextroamphetamine  (ADDERALL) 10 MG tablet, Take 1 tablet (10 mg total) by mouth 2 (two) times daily., Disp: 180 tablet, Rfl: 0   Cholecalciferol (VITAMIN D3) 50 MCG (2000 UT) CAPS, Take 1 capsule by mouth daily., Disp: ,  Rfl:    cyanocobalamin (VITAMIN B12) 500 MCG tablet, Take 500 mcg by mouth daily., Disp: , Rfl:    Daridorexant  HCl (QUVIVIQ ) 50 MG TABS, Take 1 tablet (50 mg total) by mouth at bedtime., Disp: 30 tablet, Rfl: 0   escitalopram  (LEXAPRO ) 10 MG tablet, Take 1 tablet (10 mg total) by mouth daily., Disp: 90 tablet, Rfl: 1   metoprolol  succinate (TOPROL -XL) 25 MG 24 hr tablet, Take 1 tablet (25 mg total) by mouth daily., Disp: 90 tablet, Rfl: 3   Rimegepant Sulfate (NURTEC) 75 MG TBDP, Take 1 tablet (75 mg total) by mouth every other day., Disp: 16  tablet, Rfl: 2   Zavegepant HCl (ZAVZPRET ) 10 MG/ACT SOLN, Place 1 each into the nose daily as needed., Disp: 6 each, Rfl: 1  Allergies  Allergen Reactions   Elavil  [Amitriptyline ] Anxiety   Penicillins Rash    I personally reviewed active problem list, medication list, allergies, family history with the patient/caregiver today.   ROS  Ten systems reviewed and is negative except as mentioned in HPI    Objective Physical Exam CONSTITUTIONAL: Patient appears well-developed and well-nourished. No distress. HEENT: Head atraumatic, normocephalic, neck supple. CARDIOVASCULAR: Normal rate, regular rhythm and normal heart sounds. No murmur heard. No BLE edema. PULMONARY: Effort normal and breath sounds normal. Lungs clear to auscultation. No respiratory distress. MUSCULOSKELETAL: Normal gait. Without gross motor or sensory deficit. PSYCHIATRIC: Patient has a normal mood and affect. Behavior is normal. Judgment and thought content normal.  Vitals:   01/16/24 0924  BP: 122/74  Pulse: 79  Resp: 16  SpO2: 99%  Weight: 218 lb 6.4 oz (99.1 kg)  Height: 5' 8.5 (1.74 m)    Body mass index is 32.72 kg/m.  Recent Results (from the past 2160 hours)  Hepatitis B Surface AntiBODY     Status: None   Collection Time: 01/12/24  8:55 AM  Result Value Ref Range   Hep B S Ab NON-REACTIVE NON-REACTIVE  CBC with Differential/Platelet     Status: None   Collection Time: 01/12/24  8:55 AM  Result Value Ref Range   WBC 6.1 3.8 - 10.8 Thousand/uL   RBC 5.14 4.20 - 5.80 Million/uL   Hemoglobin 15.3 13.2 - 17.1 g/dL   HCT 54.4 61.4 - 49.9 %   MCV 88.5 80.0 - 100.0 fL   MCH 29.8 27.0 - 33.0 pg   MCHC 33.6 32.0 - 36.0 g/dL    Comment: For adults, a slight decrease in the calculated MCHC value (in the range of 30 to 32 g/dL) is most likely not clinically significant; however, it should be interpreted with caution in correlation with other red cell parameters and the patient's  clinical condition.    RDW 12.4 11.0 - 15.0 %   Platelets 345 140 - 400 Thousand/uL   MPV 8.8 7.5 - 12.5 fL   Neutro Abs 3,203 1,500 - 7,800 cells/uL   Absolute Lymphocytes 2,227 850 - 3,900 cells/uL   Absolute Monocytes 482 200 - 950 cells/uL   Eosinophils Absolute 159 15 - 500 cells/uL   Basophils Absolute 31 0 - 200 cells/uL   Neutrophils Relative % 52.5 %   Total Lymphocyte 36.5 %   Monocytes Relative 7.9 %   Eosinophils Relative 2.6 %   Basophils Relative 0.5 %  Comprehensive metabolic panel with GFR     Status: None   Collection Time: 01/12/24  8:55 AM  Result Value Ref Range   Glucose, Bld 93 65 - 99 mg/dL  Comment: .            Fasting reference interval .    BUN 17 7 - 25 mg/dL   Creat 9.09 9.39 - 8.70 mg/dL   eGFR 891 > OR = 60 fO/fpw/8.26f7   BUN/Creatinine Ratio SEE NOTE: 6 - 22 (calc)    Comment:    Not Reported: BUN and Creatinine are within    reference range. .    Sodium 139 135 - 146 mmol/L   Potassium 4.1 3.5 - 5.3 mmol/L   Chloride 104 98 - 110 mmol/L   CO2 26 20 - 32 mmol/L   Calcium 9.2 8.6 - 10.3 mg/dL   Total Protein 7.5 6.1 - 8.1 g/dL   Albumin 4.7 3.6 - 5.1 g/dL   Globulin 2.8 1.9 - 3.7 g/dL (calc)   AG Ratio 1.7 1.0 - 2.5 (calc)   Total Bilirubin 0.3 0.2 - 1.2 mg/dL   Alkaline phosphatase (APISO) 42 36 - 130 U/L   AST 23 10 - 40 U/L   ALT 44 9 - 46 U/L  Lipid panel     Status: Abnormal   Collection Time: 01/12/24  8:55 AM  Result Value Ref Range   Cholesterol 202 (H) <200 mg/dL   HDL 47 > OR = 40 mg/dL   Triglycerides 863 <849 mg/dL   LDL Cholesterol (Calc) 130 (H) mg/dL (calc)    Comment: Reference range: <100 . Desirable range <100 mg/dL for primary prevention;   <70 mg/dL for patients with CHD or diabetic patients  with > or = 2 CHD risk factors. SABRA LDL-C is now calculated using the Martin-Hopkins  calculation, which is a validated novel method providing  better accuracy than the Friedewald equation in the  estimation of  LDL-C.  Gladis APPLETHWAITE et al. SANDREA. 7986;689(80): 2061-2068  (http://education.[facility].com/faq/FAQ164)    Total CHOL/HDL Ratio 4.3 <5.0 (calc)   Non-HDL Cholesterol (Calc) 155 (H) <130 mg/dL (calc)    Comment: For patients with diabetes plus 1 major ASCVD risk  factor, treating to a non-HDL-C goal of <100 mg/dL  (LDL-C of <29 mg/dL) is considered a therapeutic  option.   VITAMIN D  25 Hydroxy (Vit-D Deficiency, Fractures)     Status: None   Collection Time: 01/12/24  8:55 AM  Result Value Ref Range   Vit D, 25-Hydroxy 40 30 - 100 ng/mL    Comment: Vitamin D  Status         25-OH Vitamin D : . Deficiency:                    <20 ng/mL Insufficiency:             20 - 29 ng/mL Optimal:                 > or = 30 ng/mL . For 25-OH Vitamin D  testing on patients on  D2-supplementation and patients for whom quantitation  of D2 and D3 fractions is required, the QuestAssureD(TM) 25-OH VIT D, (D2,D3), LC/MS/MS is recommended: order  code 07111 (patients >31yrs). . See Note 1 . Note 1 . For additional information, please refer to  http://education.[facility].com/faq/FAQ199  (This link is being provided for informational/ educational purposes only.)   B12 and Folate Panel     Status: None   Collection Time: 01/12/24  8:55 AM  Result Value Ref Range   Vitamin B-12 391 200 - 1,100 pg/mL    Comment: . Please Note: Although the reference range for vitamin B12 is [phone]  pg/mL, it has been reported that between 5 and 10% of patients with values between 200 and 400 pg/mL may experience neuropsychiatric and hematologic abnormalities due to occult B12 deficiency; less than 1% of patients with values above 400 pg/mL will have symptoms. .    Folate 12.9 ng/mL    Comment:                            Reference Range                            Low:           <3.4                            Borderline:    3.4-5.4                            Normal:        >5.4 .     Diabetic Foot  Exam:     PHQ2/9:    01/16/2024    9:18 AM 01/12/2024    8:11 AM 10/12/2023    9:14 AM 07/12/2023   10:13 AM 06/14/2023    9:35 AM  Depression screen PHQ 2/9  Decreased Interest 0 0 0 0 0  Down, Depressed, Hopeless 0 0 0 0 0  PHQ - 2 Score 0 0 0 0 0  Altered sleeping 0 0 0 0 0  Tired, decreased energy 0 0 0 0 0  Change in appetite 0 0 0 0 0  Feeling bad or failure about yourself  0 0 0 0 0  Trouble concentrating 0 0 0 0 0  Moving slowly or fidgety/restless 0 0 0 0 0  Suicidal thoughts 0 0 0 0 0  PHQ-9 Score 0 0 0 0 0  Difficult doing work/chores Not difficult at all Not difficult at all Not difficult at all Not difficult at all     phq 9 is negative  Fall Risk:    01/16/2024    9:18 AM 01/12/2024    8:08 AM 10/12/2023    9:14 AM 03/09/2023    8:44 AM 02/03/2023    1:47 PM  Fall Risk   Falls in the past year? 0 0 0 0 0  Number falls in past yr: 0 0 0 0 0  Injury with Fall? 0 0 0 0 0  Risk for fall due to : No Fall Risks No Fall Risks No Fall Risks No Fall Risks No Fall Risks  Follow up Falls evaluation completed Falls evaluation completed Falls prevention discussed;Education provided;Falls evaluation completed Falls prevention discussed;Education provided;Falls evaluation completed Falls prevention discussed      Assessment & Plan Chronic insomnia Chronic insomnia managed with QVQ medication. Continued need for medication to manage sleep. - Prescribe QVQ for chronic insomnia, 30-day supply with 2 refills.  Migraine with aura Migraine with aura managed with Zapsprite nasal spray and Nurtec as needed. Significant improvement with Zapsprite, relief within 15 minutes. Infrequent migraines, last severe episode two months ago. - Continue Zapsprite nasal spray for acute migraine attacks. - Use Nurtec as needed for migraine management.  Chest pain, unspecified/Palpitation Chest pain managed with metoprolol . Metoprolol  may also reduce migraine frequency. Seen by cardiologist   - Continue metoprolol  for chest pain management.  Generalized anxiety disorder  and major depressive disorder in remission Generalized anxiety disorder and major depressive disorder in remission. Feeling better with job shift. Lexapro  maintains remission. Discussed potential seasonal affective disorder and plan to reduce Lexapro  in spring. - Continue Lexapro  10 mg for generalized anxiety disorder and major depressive disorder in remission. - Plan to reduce Lexapro  to 5 mg in the spring. - Takes Adderall daily, primarily in the morning, occasionally skips second dose. Plan to reduce and eliminate Adderall after tapering off Lexapro . - Continue Adderall  for now but the goal is to eventually stop this medication also   General Health Maintenance B12 levels improving with supplementation. Hepatitis B surface antibody test indicates need for vaccination. Lipid panel and other labs normal. Vitamin D  levels adequate. - Administer hepatitis B vaccine. - Continue current B12 supplementation.

## 2024-01-16 NOTE — Telephone Encounter (Signed)
 Pharmacy Patient Advocate Encounter  Received notification from [facility] that Prior Authorization for Quviviq  50MG  tablets  has been APPROVED from 01/16/24 to 01/15/25. Ran test claim, Copay is $30.00. This test claim was processed through [facility]- copay amounts may vary at other pharmacies due to pharmacy/plan contracts, or as the patient moves through the different stages of their insurance plan.   PA #/Case ID/Reference #: [id number]

## 2024-02-02 ENCOUNTER — Other Ambulatory Visit: Payer: Self-pay | Admitting: Family Medicine

## 2024-02-02 DIAGNOSIS — G43009 Migraine without aura, not intractable, without status migrainosus: Secondary | ICD-10-CM

## 2024-02-02 NOTE — Telephone Encounter (Signed)
 Copied from CRM [id number]. Topic: Clinical - Medication Refill >> Feb 02, 2024 12:37 PM Donee H wrote: Medication: Zavegepant HCl (ZAVZPRET ) 10 MG/ACT SOLN  Has the patient contacted their pharmacy? No  This is the patient's preferred pharmacy:  [id number] [place], [place] - [address] AT [facility] OF [address] [place] [address] Phone: [phone] Fax: [phone]   Is this the correct pharmacy for this prescription? Yes    Has the prescription been filled recently? No  Is the patient out of the medication? Yes  Has the patient been seen for an appointment in the last year OR does the patient have an upcoming appointment? Yes  Can we respond through MyChart? Yes  Agent: Please be advised that Rx refills may take up to 3 business days. We ask that you follow-up with your pharmacy.

## 2024-02-05 NOTE — Telephone Encounter (Signed)
 Requested medication (s) are due for refill today: yes  Requested medication (s) are on the active medication list: yes  Last refill:  10/12/23  Future visit scheduled: yes  Notes to clinic:  Medication not assigned to a protocol, review manually.      Requested Prescriptions  Pending Prescriptions Disp Refills   Zavegepant HCl (ZAVZPRET ) 10 MG/ACT SOLN 6 each 1    Sig: Place 1 each into the nose daily as needed.     Off-Protocol Failed - 02/05/2024  4:20 PM      Failed - Medication not assigned to a protocol, review manually.      Passed - Valid encounter within last 12 months    Recent Outpatient Visits           2 weeks ago GAD (generalized anxiety disorder)   [facility] [place] Mire, MD   3 weeks ago Well adult exam   [facility] Glenard Mire, MD   3 months ago Precordial chest pain   [facility] Glenard Mire, MD   6 months ago Viral upper respiratory tract infection   [facility] Glenard Mire, MD   7 months ago Febrile illness   [facility] Leavy Mole, PA-C

## 2024-02-23 ENCOUNTER — Ambulatory Visit: Admitting: Family Medicine

## 2024-02-23 ENCOUNTER — Encounter: Payer: Self-pay | Admitting: Family Medicine

## 2024-02-23 ENCOUNTER — Other Ambulatory Visit: Payer: Self-pay | Admitting: Family Medicine

## 2024-02-23 VITALS — BP 118/68 | HR 89 | Resp 16 | Ht 68.0 in | Wt 221.0 lb

## 2024-02-23 DIAGNOSIS — M25511 Pain in right shoulder: Secondary | ICD-10-CM

## 2024-02-23 MED ORDER — CELECOXIB 100 MG PO CAPS: 100.0000 mg | ORAL_CAPSULE | Freq: Two times a day (BID) | ORAL | 0 refills | Status: AC

## 2024-02-23 NOTE — Patient Instructions (Signed)
Shoulder Impingement Syndrome Rehab Ask your health care provider which exercises are safe for you. Do exercises exactly as told by your provider and adjust them as told. It is normal to feel mild stretching, pulling, tightness, or discomfort as you do these exercises. Stop right away if you feel sudden pain or your pain gets worse. Do not begin these exercises until told by your provider. Stretching and range-of-motion exercise This exercise warms up your muscles and joints and improves the movement and flexibility of your shoulder. This exercise also helps to relieve pain and stiffness. Passive horizontal adduction In passive adduction, you use your other hand to move the injured arm toward your body. The injured arm does not move on its own. In this movement, your arm is moved across your body in the horizontal plane (horizontal adduction). Sit or stand and pull your left / right elbow across your chest, toward your other shoulder. Stop when you feel a gentle stretch in the back of your shoulder and upper arm. Keep your arm at shoulder height. Keep your arm as close to your body as you comfortably can. Hold for __________ seconds. Slowly return to the starting position. Repeat __________ times. Complete this exercise __________ times a day. Strengthening exercises These exercises build strength and endurance in your shoulder. Endurance is the ability to use your muscles for a long time, even after they get tired. External rotation, isometric This is an exercise in which you press the back of your wrist against a doorframe without moving your shoulder joint (isometric). Stand or sit in a doorway, facing the door frame. Bend your left / right elbow and place the back of your wrist against the doorframe. Only the back of your wrist should be touching the frame. Keep your upper arm at your side. Gently press your wrist against the doorframe, as if you are trying to push your arm away from your  abdomen (external rotation). Press as hard as you are able without pain. Avoid shrugging your shoulder while you press your wrist against the doorframe. Keep your shoulder blade tucked down toward the middle of your back. Hold for __________ seconds. Slowly release the tension, and relax your muscles completely before you repeat the exercise. Repeat __________ times. Complete this exercise __________ times a day. Internal rotation, isometric This is an exercise in which you press your palm against a doorframe without moving your shoulder joint (isometric). Stand or sit in a doorway, facing the doorframe. Bend your left / right elbow and place the palm of your hand against the doorframe. Only your palm should be touching the frame. Keep your upper arm at your side. Gently press your hand against the doorframe, as if you are trying to push your arm toward your abdomen (internal rotation). Press as hard as you are able without pain. Avoid shrugging your shoulder while you press your hand against the doorframe. Keep your shoulder blade tucked down toward the middle of your back. Hold for __________ seconds. Slowly release the tension, and relax your muscles completely before you repeat the exercise. Repeat __________ times. Complete this exercise __________ times a day. Scapular protraction, supine, isotonic  Lie on your back on a firm surface (supine position). Hold a __________ weight in your left / right hand. Raise your left / right arm straight into the air so your hand is directly above your shoulder joint. Push the weight into the air so your shoulder (scapula) lifts off the surface that you are lying on.  The scapula will push up or forward (protraction). Do not move your head, neck, or back. Hold for __________ seconds. Slowly return to the starting position. Let your muscles relax completely before you repeat this exercise. Repeat __________ times. Complete this exercise __________ times a  day. Scapular retraction, isotonic  Sit in a stable chair without armrests or stand up. Secure an exercise band to a stable object in front of you so the band is at shoulder height. Hold one end of the exercise band in each hand. Squeeze your shoulder blades together (retraction) and move your elbows slightly behind you. Do not shrug your shoulders upward while you do this. Hold for __________ seconds. Slowly return to the starting position. Repeat __________ times. Complete this exercise __________ times a day. Shoulder extension, isotonic  Sit in a stable chair without armrests or stand up. Secure an exercise band to a stable object in front of you so the band is above shoulder height. Hold one end of the exercise band in each hand. Straighten your elbows and lift your hands up to shoulder height. Squeeze your shoulder blades together and pull your hands down to the sides of your thighs (extension). Stop when your hands are straight down by your sides. Do not let your hands go behind your body. Hold for __________ seconds. Slowly return to the starting position. Repeat __________ times. Complete this exercise __________ times a day. This information is not intended to replace advice given to you by your health care provider. Make sure you discuss any questions you have with your health care provider. Document Revised: 01/06/2022 Document Reviewed: 01/06/2022 Elsevier Patient Education  2024 [employer].

## 2024-02-23 NOTE — Progress Notes (Signed)
 Name: Gregory Matthews   MRN: [id number]    DOB: 05-22-1980   Date:02/23/2024       Progress Note  Subjective  Chief Complaint  Chief Complaint  Patient presents with   Shoulder Pain    Right, x2 weeks. Throbbing pain that happens mostly w/laying down    Discussed the use of AI scribe software for clinical note transcription with the patient, who gave verbal consent to proceed.  History of Present Illness Gregory Matthews is a 43 year old male who presents with right shoulder pain and tingling in the fingers.  He has been experiencing right shoulder pain for the past two weeks, which has progressively worsened. The pain is particularly bothersome at night, affecting his ability to sleep as he sleeps on his side. Initially, the pain was only present when lying on the shoulder, but it now persists into the morning.  He describes a tingling sensation in the middle two fingers of his right hand. There is no weakness or dropping of objects. No history of prior shoulder problems or imaging studies related to the shoulder.  He recalls a possible minor injury in 2018 when he slipped while doing push-ups, but the pain resolved with icing at that time. Recently, he participated in activities such as playing softball and pressure washing, which may have contributed to the current symptoms.  He has not tried any medications for this issue yet. He denies any history of adverse reactions to anti-inflammatory medications, although he is allergic to penicillin and Aleve .    Patient Active Problem List   Diagnosis Date Noted   Palpitations 01/03/2024   Gastroesophageal reflux disease without esophagitis 10/12/2023   Dyslipidemia 10/12/2023   Precordial chest pain 10/12/2023   Major depression in remission 04/14/2021   B12 deficiency 04/14/2021   Vitamin D  deficiency 04/14/2021   GAD (generalized anxiety disorder) 04/14/2021   Headache, migraine 06/08/2015   Obesity (BMI 30.0-34.9) 06/08/2015    Allergic rhinitis, seasonal 07/26/2013    Social History   Tobacco Use   Smoking status: Never    Passive exposure: Past   Smokeless tobacco: Never  Substance Use Topics   Alcohol use: No    Alcohol/week: 0.0 standard drinks of alcohol     Current Outpatient Medications:    amphetamine -dextroamphetamine  (ADDERALL) 10 MG tablet, Take 1 tablet (10 mg total) by mouth 2 (two) times daily., Disp: 180 tablet, Rfl: 0   celecoxib (CELEBREX) 100 MG capsule, Take 1 capsule (100 mg total) by mouth 2 (two) times daily., Disp: 60 capsule, Rfl: 0   Cholecalciferol (VITAMIN D3) 50 MCG (2000 UT) CAPS, Take 1 capsule by mouth daily., Disp: , Rfl:    cyanocobalamin (VITAMIN B12) 500 MCG tablet, Take 500 mcg by mouth daily., Disp: , Rfl:    Daridorexant  HCl (QUVIVIQ ) 50 MG TABS, Take 1 tablet (50 mg total) by mouth at bedtime., Disp: 30 tablet, Rfl: 2   escitalopram  (LEXAPRO ) 10 MG tablet, Take 1 tablet (10 mg total) by mouth daily., Disp: 90 tablet, Rfl: 1   metoprolol  succinate (TOPROL -XL) 25 MG 24 hr tablet, Take 1 tablet (25 mg total) by mouth daily., Disp: 90 tablet, Rfl: 3   Rimegepant Sulfate (NURTEC) 75 MG TBDP, Take 1 tablet (75 mg total) by mouth every other day., Disp: 16 tablet, Rfl: 2   Zavegepant HCl (ZAVZPRET ) 10 MG/ACT SOLN, Place 1 each into the nose daily as needed., Disp: 6 each, Rfl: 1  Allergies  Allergen Reactions   Elavil  [Amitriptyline ]  Anxiety   Penicillins Rash    ROS  Ten systems reviewed and is negative except as mentioned in HPI   Objective  Vitals:   02/23/24 1349  BP: 118/68  Pulse: 89  Resp: 16  SpO2: 98%  Weight: 221 lb (100.2 kg)  Height: 5' 8 (1.727 m)    Body mass index is 33.6 kg/m.   Physical Exam CONSTITUTIONAL: Patient appears well-developed and well-nourished. No distress. HEENT: Head atraumatic, normocephalic, neck supple. CARDIOVASCULAR: Normal rate, regular rhythm and normal heart sounds. No murmur heard. No BLE edema. PULMONARY:  Effort normal and breath sounds normal. Lungs clear to auscultation bilaterally. No respiratory distress. ABDOMINAL: There is no tenderness or distention. MUSCULOSKELETAL: Normal gait. Pain on abduction at 90 degrees, top of shoulder. Without gross motor or sensory deficit. Normal internal rotation and negative empty can sign PSYCHIATRIC: Patient has a normal mood and affect. Behavior is normal. Judgment and thought content normal.  Recent Results (from the past 2160 hours)  Hepatitis B Surface AntiBODY     Status: None   Collection Time: 01/12/24  8:55 AM  Result Value Ref Range   Hep B S Ab NON-REACTIVE NON-REACTIVE  CBC with Differential/Platelet     Status: None   Collection Time: 01/12/24  8:55 AM  Result Value Ref Range   WBC 6.1 3.8 - 10.8 Thousand/uL   RBC 5.14 4.20 - 5.80 Million/uL   Hemoglobin 15.3 13.2 - 17.1 g/dL   HCT 54.4 61.4 - 49.9 %   MCV 88.5 80.0 - 100.0 fL   MCH 29.8 27.0 - 33.0 pg   MCHC 33.6 32.0 - 36.0 g/dL    Comment: For adults, a slight decrease in the calculated MCHC value (in the range of 30 to 32 g/dL) is most likely not clinically significant; however, it should be interpreted with caution in correlation with other red cell parameters and the patient's clinical condition.    RDW 12.4 11.0 - 15.0 %   Platelets 345 140 - 400 Thousand/uL   MPV 8.8 7.5 - 12.5 fL   Neutro Abs 3,203 1,500 - 7,800 cells/uL   Absolute Lymphocytes 2,227 850 - 3,900 cells/uL   Absolute Monocytes 482 200 - 950 cells/uL   Eosinophils Absolute 159 15 - 500 cells/uL   Basophils Absolute 31 0 - 200 cells/uL   Neutrophils Relative % 52.5 %   Total Lymphocyte 36.5 %   Monocytes Relative 7.9 %   Eosinophils Relative 2.6 %   Basophils Relative 0.5 %  Comprehensive metabolic panel with GFR     Status: None   Collection Time: 01/12/24  8:55 AM  Result Value Ref Range   Glucose, Bld 93 65 - 99 mg/dL    Comment: .            Fasting reference interval .    BUN 17 7 - 25 mg/dL    Creat 9.09 9.39 - 8.70 mg/dL   eGFR 891 > OR = 60 fO/fpw/8.26f7   BUN/Creatinine Ratio SEE NOTE: 6 - 22 (calc)    Comment:    Not Reported: BUN and Creatinine are within    reference range. .    Sodium 139 135 - 146 mmol/L   Potassium 4.1 3.5 - 5.3 mmol/L   Chloride 104 98 - 110 mmol/L   CO2 26 20 - 32 mmol/L   Calcium 9.2 8.6 - 10.3 mg/dL   Total Protein 7.5 6.1 - 8.1 g/dL   Albumin 4.7 3.6 - 5.1 g/dL   Globulin  2.8 1.9 - 3.7 g/dL (calc)   AG Ratio 1.7 1.0 - 2.5 (calc)   Total Bilirubin 0.3 0.2 - 1.2 mg/dL   Alkaline phosphatase (APISO) 42 36 - 130 U/L   AST 23 10 - 40 U/L   ALT 44 9 - 46 U/L  Lipid panel     Status: Abnormal   Collection Time: 01/12/24  8:55 AM  Result Value Ref Range   Cholesterol 202 (H) <200 mg/dL   HDL 47 > OR = 40 mg/dL   Triglycerides 863 <849 mg/dL   LDL Cholesterol (Calc) 130 (H) mg/dL (calc)    Comment: Reference range: <100 . Desirable range <100 mg/dL for primary prevention;   <70 mg/dL for patients with CHD or diabetic patients  with > or = 2 CHD risk factors. SABRA LDL-C is now calculated using the Martin-Hopkins  calculation, which is a validated novel method providing  better accuracy than the Friedewald equation in the  estimation of LDL-C.  Gladis APPLETHWAITE et al. SANDREA. 7986;689(80): 2061-2068  (http://education.[facility].com/faq/FAQ164)    Total CHOL/HDL Ratio 4.3 <5.0 (calc)   Non-HDL Cholesterol (Calc) 155 (H) <130 mg/dL (calc)    Comment: For patients with diabetes plus 1 major ASCVD risk  factor, treating to a non-HDL-C goal of <100 mg/dL  (LDL-C of <29 mg/dL) is considered a therapeutic  option.   VITAMIN D  25 Hydroxy (Vit-D Deficiency, Fractures)     Status: None   Collection Time: 01/12/24  8:55 AM  Result Value Ref Range   Vit D, 25-Hydroxy 40 30 - 100 ng/mL    Comment: Vitamin D  Status         25-OH Vitamin D : . Deficiency:                    <20 ng/mL Insufficiency:             20 - 29 ng/mL Optimal:                  > or = 30 ng/mL . For 25-OH Vitamin D  testing on patients on  D2-supplementation and patients for whom quantitation  of D2 and D3 fractions is required, the [facility](TM) 25-OH VIT D, (D2,D3), LC/MS/MS is recommended: order  code 07111 (patients >51yrs). . See Note 1 . Note 1 . For additional information, please refer to  http://education.[facility].com/faq/FAQ199  (This link is being provided for informational/ educational purposes only.)   B12 and Folate Panel     Status: None   Collection Time: 01/12/24  8:55 AM  Result Value Ref Range   Vitamin B-12 391 200 - 1,100 pg/mL    Comment: . Please Note: Although the reference range for vitamin B12 is [phone] pg/mL, it has been reported that between 5 and 10% of patients with values between 200 and 400 pg/mL may experience neuropsychiatric and hematologic abnormalities due to occult B12 deficiency; less than 1% of patients with values above 400 pg/mL will have symptoms. .    Folate 12.9 ng/mL    Comment:                            Reference Range                            Low:           <3.4  Borderline:    3.4-5.4                            Normal:        >5.4 .       Assessment & Plan Right shoulder impingement syndrome Acute inflammation causing pain and tingling, worsens at night. No tear suspected. Pain with abduction at 90 degrees. - Prescribed Celebrex 200 mg, one tablet twice daily. - Advised ice application to shoulder. - Provided home shoulder rehabilitation exercises. - Recommended avoiding pain-exacerbating activities. - Suggested specialized pillow for sleep. - Instructed to monitor symptoms, report in one week if no improvement. - Consider referral to physical therapy or orthopedics if no improvement after one week.

## 2024-02-23 NOTE — Progress Notes (Deleted)
 Name: Gregory Matthews   MRN: [id number]    DOB: 03-25-1981   Date:02/23/2024       Progress Note  Subjective  Chief Complaint  Chief Complaint  Patient presents with   Shoulder Pain    Right, x2 weeks. Throbbing pain that happens mostly w/laying down   {HPI:32069} *** Patient Active Problem List   Diagnosis Date Noted   Palpitations 01/03/2024   Gastroesophageal reflux disease without esophagitis 10/12/2023   Dyslipidemia 10/12/2023   Precordial chest pain 10/12/2023   Major depression in remission 04/14/2021   B12 deficiency 04/14/2021   Vitamin D  deficiency 04/14/2021   GAD (generalized anxiety disorder) 04/14/2021   Headache, migraine 06/08/2015   Obesity (BMI 30.0-34.9) 06/08/2015   Allergic rhinitis, seasonal 07/26/2013    Past Surgical History:  Procedure Laterality Date   HAIR TRANSPLANT     RHINOPLASTY     TONSILLECTOMY      Family History  Problem Relation Age of Onset   Cancer Mother        Breast   Hypertension Father    Heart disease Father    Heart attack Father    Cancer Maternal Aunt        Breast   Hyperlipidemia Maternal Grandmother    Heart attack Maternal Grandfather    Heart attack Paternal Grandfather    Parkinson's disease Paternal Grandfather     Social History   Tobacco Use   Smoking status: Never    Passive exposure: Past   Smokeless tobacco: Never  Substance Use Topics   Alcohol use: No    Alcohol/week: 0.0 standard drinks of alcohol     Current Outpatient Medications:    amphetamine -dextroamphetamine  (ADDERALL) 10 MG tablet, Take 1 tablet (10 mg total) by mouth 2 (two) times daily., Disp: 180 tablet, Rfl: 0   Cholecalciferol (VITAMIN D3) 50 MCG (2000 UT) CAPS, Take 1 capsule by mouth daily., Disp: , Rfl:    cyanocobalamin (VITAMIN B12) 500 MCG tablet, Take 500 mcg by mouth daily., Disp: , Rfl:    Daridorexant  HCl (QUVIVIQ ) 50 MG TABS, Take 1 tablet (50 mg total) by mouth at bedtime., Disp: 30 tablet, Rfl: 2    escitalopram  (LEXAPRO ) 10 MG tablet, Take 1 tablet (10 mg total) by mouth daily., Disp: 90 tablet, Rfl: 1   metoprolol  succinate (TOPROL -XL) 25 MG 24 hr tablet, Take 1 tablet (25 mg total) by mouth daily., Disp: 90 tablet, Rfl: 3   Rimegepant Sulfate (NURTEC) 75 MG TBDP, Take 1 tablet (75 mg total) by mouth every other day., Disp: 16 tablet, Rfl: 2   Zavegepant HCl (ZAVZPRET ) 10 MG/ACT SOLN, Place 1 each into the nose daily as needed., Disp: 6 each, Rfl: 1  Allergies  Allergen Reactions   Elavil  [Amitriptyline ] Anxiety   Penicillins Rash    I personally reviewed active problem list, medication list, allergies with the patient/caregiver today.   ROS  Ten systems reviewed and is negative except as mentioned in HPI    Objective Physical Exam   Vitals:   02/23/24 1349  BP: 118/68  Pulse: 89  Resp: 16  SpO2: 98%  Weight: 221 lb (100.2 kg)  Height: 5' 8 (1.727 m)    Body mass index is 33.6 kg/m.  Recent Results (from the past 2160 hours)  Hepatitis B Surface AntiBODY     Status: None   Collection Time: 01/12/24  8:55 AM  Result Value Ref Range   Hep B S Ab NON-REACTIVE NON-REACTIVE  CBC with Differential/Platelet  Status: None   Collection Time: 01/12/24  8:55 AM  Result Value Ref Range   WBC 6.1 3.8 - 10.8 Thousand/uL   RBC 5.14 4.20 - 5.80 Million/uL   Hemoglobin 15.3 13.2 - 17.1 g/dL   HCT 54.4 61.4 - 49.9 %   MCV 88.5 80.0 - 100.0 fL   MCH 29.8 27.0 - 33.0 pg   MCHC 33.6 32.0 - 36.0 g/dL    Comment: For adults, a slight decrease in the calculated MCHC value (in the range of 30 to 32 g/dL) is most likely not clinically significant; however, it should be interpreted with caution in correlation with other red cell parameters and the patient's clinical condition.    RDW 12.4 11.0 - 15.0 %   Platelets 345 140 - 400 Thousand/uL   MPV 8.8 7.5 - 12.5 fL   Neutro Abs 3,203 1,500 - 7,800 cells/uL   Absolute Lymphocytes 2,227 850 - 3,900 cells/uL   Absolute  Monocytes 482 200 - 950 cells/uL   Eosinophils Absolute 159 15 - 500 cells/uL   Basophils Absolute 31 0 - 200 cells/uL   Neutrophils Relative % 52.5 %   Total Lymphocyte 36.5 %   Monocytes Relative 7.9 %   Eosinophils Relative 2.6 %   Basophils Relative 0.5 %  Comprehensive metabolic panel with GFR     Status: None   Collection Time: 01/12/24  8:55 AM  Result Value Ref Range   Glucose, Bld 93 65 - 99 mg/dL    Comment: .            Fasting reference interval .    BUN 17 7 - 25 mg/dL   Creat 9.09 9.39 - 8.70 mg/dL   eGFR 891 > OR = 60 fO/fpw/8.26f7   BUN/Creatinine Ratio SEE NOTE: 6 - 22 (calc)    Comment:    Not Reported: BUN and Creatinine are within    reference range. .    Sodium 139 135 - 146 mmol/L   Potassium 4.1 3.5 - 5.3 mmol/L   Chloride 104 98 - 110 mmol/L   CO2 26 20 - 32 mmol/L   Calcium 9.2 8.6 - 10.3 mg/dL   Total Protein 7.5 6.1 - 8.1 g/dL   Albumin 4.7 3.6 - 5.1 g/dL   Globulin 2.8 1.9 - 3.7 g/dL (calc)   AG Ratio 1.7 1.0 - 2.5 (calc)   Total Bilirubin 0.3 0.2 - 1.2 mg/dL   Alkaline phosphatase (APISO) 42 36 - 130 U/L   AST 23 10 - 40 U/L   ALT 44 9 - 46 U/L  Lipid panel     Status: Abnormal   Collection Time: 01/12/24  8:55 AM  Result Value Ref Range   Cholesterol 202 (H) <200 mg/dL   HDL 47 > OR = 40 mg/dL   Triglycerides 863 <849 mg/dL   LDL Cholesterol (Calc) 130 (H) mg/dL (calc)    Comment: Reference range: <100 . Desirable range <100 mg/dL for primary prevention;   <70 mg/dL for patients with CHD or diabetic patients  with > or = 2 CHD risk factors. SABRA LDL-C is now calculated using the Martin-Hopkins  calculation, which is a validated novel method providing  better accuracy than the Friedewald equation in the  estimation of LDL-C.  Gladis APPLETHWAITE et al. SANDREA. 7986;689(80): 2061-2068  (http://education.[facility].com/faq/FAQ164)    Total CHOL/HDL Ratio 4.3 <5.0 (calc)   Non-HDL Cholesterol (Calc) 155 (H) <130 mg/dL (calc)    Comment: For  patients with diabetes plus 1 major  ASCVD risk  factor, treating to a non-HDL-C goal of <100 mg/dL  (LDL-C of <29 mg/dL) is considered a therapeutic  option.   VITAMIN D  25 Hydroxy (Vit-D Deficiency, Fractures)     Status: None   Collection Time: 01/12/24  8:55 AM  Result Value Ref Range   Vit D, 25-Hydroxy 40 30 - 100 ng/mL    Comment: Vitamin D  Status         25-OH Vitamin D : . Deficiency:                    <20 ng/mL Insufficiency:             20 - 29 ng/mL Optimal:                 > or = 30 ng/mL . For 25-OH Vitamin D  testing on patients on  D2-supplementation and patients for whom quantitation  of D2 and D3 fractions is required, the [facility]ure[facility](TM) 25-OH VIT D, (D2,D3), LC/MS/MS is recommended: order  code 07111 (patients >87yrs). . See Note 1 . Note 1 . For additional information, please refer to  http://education.[facility].com/faq/FAQ199  (This link is being provided for informational/ educational purposes only.)   B12 and Folate Panel     Status: None   Collection Time: 01/12/24  8:55 AM  Result Value Ref Range   Vitamin B-12 391 200 - 1,100 pg/mL    Comment: . Please Note: Although the reference range for vitamin B12 is [phone] pg/mL, it has been reported that between 5 and 10% of patients with values between 200 and 400 pg/mL may experience neuropsychiatric and hematologic abnormalities due to occult B12 deficiency; less than 1% of patients with values above 400 pg/mL will have symptoms. .    Folate 12.9 ng/mL    Comment:                            Reference Range                            Low:           <3.4                            Borderline:    3.4-5.4                            Normal:        >5.4 .     Diabetic Foot Exam: {Perform Simple Foot Exam  Perform Detailed exam:1} {Insert foot Exam (Optional):30965}   PHQ2/9:    01/16/2024    9:18 AM 01/12/2024    8:11 AM 10/12/2023    9:14 AM 07/12/2023   10:13 AM 06/14/2023    9:35 AM   Depression screen PHQ 2/9  Decreased Interest 0 0 0 0 0  Down, Depressed, Hopeless 0 0 0 0 0  PHQ - 2 Score 0 0 0 0 0  Altered sleeping 0 0 0 0 0  Tired, decreased energy 0 0 0 0 0  Change in appetite 0 0 0 0 0  Feeling bad or failure about yourself  0 0 0 0 0  Trouble concentrating 0 0 0 0 0  Moving slowly or fidgety/restless 0 0 0 0 0  Suicidal thoughts 0 0 0 0 0  PHQ-9 Score 0 0 0 0 0  Difficult doing work/chores Not difficult at all Not difficult at all Not difficult at all Not difficult at all     phq 9 is {gen pos wzh:[id number]}  Fall Risk:    01/16/2024    9:18 AM 01/12/2024    8:08 AM 10/12/2023    9:14 AM 03/09/2023    8:44 AM 02/03/2023    1:47 PM  Fall Risk   Falls in the past year? 0 0 0 0 0  Number falls in past yr: 0 0 0 0 0  Injury with Fall? 0 0 0 0 0  Risk for fall due to : No Fall Risks No Fall Risks No Fall Risks No Fall Risks No Fall Risks  Follow up Falls evaluation completed Falls evaluation completed Falls prevention discussed;Education provided;Falls evaluation completed Falls prevention discussed;Education provided;Falls evaluation completed Falls prevention discussed     {A&P:32071}

## 2024-04-16 ENCOUNTER — Encounter: Payer: Self-pay | Admitting: Family Medicine

## 2024-04-16 ENCOUNTER — Ambulatory Visit: Admitting: Family Medicine

## 2024-04-16 VITALS — BP 114/72 | HR 66 | Resp 16 | Ht 68.0 in | Wt 227.8 lb

## 2024-04-16 DIAGNOSIS — G43009 Migraine without aura, not intractable, without status migrainosus: Secondary | ICD-10-CM | POA: Diagnosis not present

## 2024-04-16 DIAGNOSIS — M25811 Other specified joint disorders, right shoulder: Secondary | ICD-10-CM

## 2024-04-16 DIAGNOSIS — F325 Major depressive disorder, single episode, in full remission: Secondary | ICD-10-CM | POA: Diagnosis not present

## 2024-04-16 DIAGNOSIS — F5104 Psychophysiologic insomnia: Secondary | ICD-10-CM

## 2024-04-16 DIAGNOSIS — Z23 Encounter for immunization: Secondary | ICD-10-CM | POA: Diagnosis not present

## 2024-04-16 MED ORDER — PREGABALIN 25 MG PO CAPS: 25.0000 mg | ORAL_CAPSULE | Freq: Two times a day (BID) | ORAL | 0 refills | Status: AC

## 2024-04-16 MED ORDER — AMPHETAMINE-DEXTROAMPHETAMINE 10 MG PO TABS: 10.0000 mg | ORAL_TABLET | Freq: Two times a day (BID) | ORAL | 0 refills | Status: AC

## 2024-04-16 MED ORDER — NURTEC 75 MG PO TBDP: 1.0000 | ORAL_TABLET | ORAL | 2 refills | Status: AC

## 2024-04-16 MED ORDER — QUVIVIQ 50 MG PO TABS: 1.0000 | ORAL_TABLET | Freq: Every day | ORAL | 2 refills | Status: AC

## 2024-04-16 NOTE — Progress Notes (Signed)
 Name: Gregory Matthews   MRN: [id number]    DOB: 10/20/1980   Date:04/16/2024       Progress Note  Subjective  Chief Complaint  Chief Complaint  Patient presents with   Medical Management of Chronic Issues   Discussed the use of AI scribe software for clinical note transcription with the patient, who gave verbal consent to proceed.  History of Present Illness Gregory Matthews is a 43 year old male who presents with persistent right shoulder pain and tingling in the fingers.  He has been experiencing persistent right shoulder pain since mid October 2025 . The pain is severe, affecting his ability to sleep and causing discomfort when lying down. Initially, the pain occurred only when lying down but has progressed to being present during the day as well. He notes difficulty rolling onto the affected shoulder at night also pain with abduction of right shoulder, some neck pain.   He experiences tingling in the middle two fingers of his right hand. He visited a chiropractor who suggested a possible bone misalignment or rib issue. Massage therapy also indicated a potential rib misalignment. Despite taking prescribed medications, including Celebrex  twice daily, and using ice, there has been no significant improvement in symptoms.  He has a history of chronic insomnia, for which he takes Quviviq . The shoulder pain exacerbates his sleep difficulties. He also takes Lexapro  for anxiety and depression, which he states is in remission, and Adderall for attention and focus, which he takes primarily in the morning.  He experiences migraine headaches, which he manages with Zavzpret  nasal spray for acute episodes and Nurtec every other day for prevention. He reports that recent headaches seem to originate from his neck, possibly related to his shoulder pain.  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    Patient Active Problem List   Diagnosis Date Noted   Palpitations 01/03/2024   Gastroesophageal reflux disease without esophagitis 10/12/2023   Dyslipidemia 10/12/2023   Precordial chest pain 10/12/2023   Major depression in remission 04/14/2021   B12 deficiency 04/14/2021   Vitamin D  deficiency 04/14/2021   GAD (generalized anxiety disorder) 04/14/2021   Headache, migraine 06/08/2015   Obesity (BMI 30.0-34.9) 06/08/2015   Allergic rhinitis, seasonal 07/26/2013    Past Surgical History:  Procedure Laterality Date   HAIR TRANSPLANT     RHINOPLASTY     TONSILLECTOMY      Family History  Problem Relation Age of Onset   Cancer Mother        Breast   Hypertension Father    Heart disease Father    Heart attack Father    Cancer Maternal Aunt        Breast   Hyperlipidemia Maternal Grandmother    Heart attack Maternal Grandfather    Heart attack Paternal Grandfather    Parkinson's disease Paternal Grandfather     Social History   Tobacco Use   Smoking status: Never    Passive exposure: Past   Smokeless tobacco: Never  Substance Use Topics   Alcohol use: No    Alcohol/week: 0.0 standard drinks of alcohol    Current Medications[1]  Allergies[2]  I personally reviewed active problem list, medication list, allergies, family history with the patient/caregiver today.   ROS  Ten systems reviewed and is negative except as mentioned in HPI    Objective Physical Exam CONSTITUTIONAL:  Patient appears well-developed and well-nourished. No distress. HEENT: Head atraumatic, normocephalic, neck supple. CARDIOVASCULAR: Normal rate, regular rhythm and normal heart sounds. No murmur heard. No BLE edema. PULMONARY: Effort normal and breath sounds normal. No respiratory distress. ABDOMINAL: There is no tenderness or distention. MUSCULOSKELETAL: Normal gait. Without gross motor or sensory deficit. Shoulder impingement with limited  abduction. Neck pain on forward flexion. PSYCHIATRIC: Patient has a normal mood and affect. Behavior is normal. Judgment and thought content normal.  Vitals:   04/16/24 1043  BP: 114/72  Pulse: 66  Resp: 16  SpO2: 96%  Weight: 227 lb 12.8 oz (103.3 kg)  Height: 5' 8 (1.727 m)    Body mass index is 34.64 kg/m.   PHQ2/9:    04/16/2024   10:43 AM 01/16/2024    9:18 AM 01/12/2024    8:11 AM 10/12/2023    9:14 AM 07/12/2023   10:13 AM  Depression screen PHQ 2/9  Decreased Interest 0 0 0 0 0  Down, Depressed, Hopeless 0 0 0 0 0  PHQ - 2 Score 0 0 0 0 0  Altered sleeping 0 0 0 0 0  Tired, decreased energy 0 0 0 0 0  Change in appetite 0 0 0 0 0  Feeling bad or failure about yourself  0 0 0 0 0  Trouble concentrating 0 0 0 0 0  Moving slowly or fidgety/restless 0 0 0 0 0  Suicidal thoughts 0 0 0 0 0  PHQ-9 Score 0 0  0  0  0   Difficult doing work/chores Not difficult at all Not difficult at all Not difficult at all Not difficult at all Not difficult at all     Data saved with a previous flowsheet row definition    phq 9 is negative  Fall Risk:    04/16/2024   10:43 AM 01/16/2024    9:18 AM 01/12/2024    8:08 AM 10/12/2023    9:14 AM 03/09/2023    8:44 AM  Fall Risk   Falls in the past year? 0 0 0 0 0  Number falls in past yr: 0 0 0 0 0  Injury with Fall? 0 0  0  0  0   Risk for fall due to : No Fall Risks No Fall Risks No Fall Risks No Fall Risks No Fall Risks  Follow up Falls evaluation completed Falls evaluation completed Falls evaluation completed Falls prevention discussed;Education provided;Falls evaluation completed Falls prevention discussed;Education provided;Falls evaluation completed     Data saved with a previous flowsheet row definition     Assessment & Plan Right shoulder impingement with possible cervical radiculopathy Chronic right shoulder pain with possible cervical radiculopathy. Previous treatments ineffective. MRI considered for further  evaluation. - Referred to orthopedic specialist for further evaluation and possible MRI. - Prescribed pregabalin  starting at 25 mg at night, titrate to 100 mg as needed, monitor for side effects. - Continue chiropractic care as needed, prioritize orthopedic evaluation.  Chronic insomnia Exacerbated by shoulder pain. Managed with Quviviq . - Continue Quviviq  for insomnia management.  Migraine without aura Well-controlled with Nurtec and Zavzpret . Recent episode managed effectively with Zavzpret . - Continue Nurtec for migraine prevention.  Major depressive disorder in remission In remission. Anxiety managed with Lexapro . Adderall effective for attention and focus. - Continue Lexapro  for anxiety and depression management. - Continue Adderall as needed for attention and focus.  General health maintenance Hepatitis B vaccination up to date. Vitamin B12 deficiency treated. Folic acid levels adequate. -  Continue current health maintenance regimen.        [1]  Current Outpatient Medications:    amphetamine -dextroamphetamine  (ADDERALL) 10 MG tablet, Take 1 tablet (10 mg total) by mouth 2 (two) times daily., Disp: 180 tablet, Rfl: 0   celecoxib  (CELEBREX ) 100 MG capsule, Take 1 capsule (100 mg total) by mouth 2 (two) times daily., Disp: 60 capsule, Rfl: 0   Cholecalciferol (VITAMIN D3) 50 MCG (2000 UT) CAPS, Take 1 capsule by mouth daily., Disp: , Rfl:    cyanocobalamin (VITAMIN B12) 500 MCG tablet, Take 500 mcg by mouth daily., Disp: , Rfl:    Daridorexant  HCl (QUVIVIQ ) 50 MG TABS, Take 1 tablet (50 mg total) by mouth at bedtime., Disp: 30 tablet, Rfl: 2   escitalopram  (LEXAPRO ) 10 MG tablet, Take 1 tablet (10 mg total) by mouth daily., Disp: 90 tablet, Rfl: 1   metoprolol  succinate (TOPROL -XL) 25 MG 24 hr tablet, Take 1 tablet (25 mg total) by mouth daily., Disp: 90 tablet, Rfl: 3   Rimegepant Sulfate (NURTEC) 75 MG TBDP, Take 1 tablet (75 mg total) by mouth every other day., Disp: 16  tablet, Rfl: 2   Zavegepant HCl (ZAVZPRET ) 10 MG/ACT SOLN, Place 1 each into the nose daily as needed., Disp: 6 each, Rfl: 1 [2]  Allergies Allergen Reactions   Elavil  [Amitriptyline ] Anxiety   Penicillins Rash

## 2024-04-22 ENCOUNTER — Encounter: Payer: Self-pay | Admitting: Nurse Practitioner

## 2024-04-22 ENCOUNTER — Ambulatory Visit (INDEPENDENT_AMBULATORY_CARE_PROVIDER_SITE_OTHER): Admitting: Nurse Practitioner

## 2024-04-22 VITALS — BP 130/84 | HR 67 | Temp 97.5°F | Ht 68.0 in | Wt 227.0 lb

## 2024-04-22 DIAGNOSIS — J020 Streptococcal pharyngitis: Secondary | ICD-10-CM

## 2024-04-22 DIAGNOSIS — J029 Acute pharyngitis, unspecified: Secondary | ICD-10-CM | POA: Diagnosis not present

## 2024-04-22 DIAGNOSIS — R051 Acute cough: Secondary | ICD-10-CM

## 2024-04-22 LAB — POCT RAPID STREP A (OFFICE): Rapid Strep A Screen: POSITIVE — AB

## 2024-04-22 MED ORDER — AZITHROMYCIN 500 MG PO TABS: 500.0000 mg | ORAL_TABLET | Freq: Every day | ORAL | 0 refills | Status: AC

## 2024-04-22 MED ORDER — BENZONATATE 100 MG PO CAPS: 200.0000 mg | ORAL_CAPSULE | Freq: Two times a day (BID) | ORAL | 0 refills | Status: AC | PRN

## 2024-04-22 MED ORDER — PROMETHAZINE-DM 6.25-15 MG/5ML PO SYRP: 5.0000 mL | ORAL_SOLUTION | Freq: Four times a day (QID) | ORAL | 0 refills | Status: AC | PRN

## 2024-04-22 NOTE — Progress Notes (Signed)
 "  BP 130/84   Pulse 67   Temp (!) 97.5 F (36.4 C)   Ht 5' 8 (1.727 m)   Wt 227 lb (103 kg)   SpO2 98%   BMI 34.52 kg/m    Subjective:    Patient ID: Gregory Matthews, male    DOB: [birth year], 43 y.o.   MRN: [id number]  HPI: Gregory Matthews is a 43 y.o. male  Chief Complaint  Patient presents with   Sore Throat    Pt c/o sore throat and chills x6 days.    Discussed the use of AI scribe software for clinical note transcription with the patient, who gave verbal consent to proceed.  History of Present Illness Gregory Matthews is a 43 year old male who presents with sore throat, cough, fever, and chills for six days.  Upper respiratory symptoms - Sore throat began intensely for two days, then subsided slightly before returning - Wakes up with 'awful burning' in throat - Cough present, treated with Robitussin - Chest tightness accompanying sore throat and cough  Constitutional symptoms - Fever and chills for six days - Severe chills Saturday night and early Sunday morning, unable to get warm despite blankets and electric blanket - Temperature did not register as a fever during chills - Low energy noted Saturday - Difficulty sleeping due to symptoms  Gastrointestinal symptoms - Diarrhea developed on Saturday  Musculoskeletal symptoms - Joint aches began Saturday night  Recent immunization - Symptoms began after receiving second part of hepatitis B vaccine last week  Allergies and self-care - Allergic to penicillin - Using over-the-counter remedies including hot tea, Theraflu, vitamin zinc, and vitamin supplements         12 /23/2025   10:43 AM 01/16/2024    9:18 AM 01/12/2024    8:11 AM  Depression screen PHQ 2/9  Decreased Interest 0 0 0  Down, Depressed, Hopeless 0 0 0  PHQ - 2 Score 0 0 0  Altered sleeping 0 0 0  Tired, decreased energy 0 0 0  Change in appetite 0 0 0  Feeling bad or failure about yourself  0 0 0  Trouble concentrating 0 0 0   Moving slowly or fidgety/restless 0 0 0  Suicidal thoughts 0 0 0  PHQ-9 Score 0 0  0   Difficult doing work/chores Not difficult at all Not difficult at all Not difficult at all     Data saved with a previous flowsheet row definition    Relevant past medical, surgical, family and social history reviewed and updated as indicated. Interim medical history since our last visit reviewed. Allergies and medications reviewed and updated.  Review of Systems  Ten systems reviewed and is negative except as mentioned in HPI      Objective:      BP 130/84   Pulse 67   Temp (!) 97.5 F (36.4 C)   Ht 5' 8 (1.727 m)   Wt 227 lb (103 kg)   SpO2 98%   BMI 34.52 kg/m    Wt Readings from Last 3 Encounters:  04/22/24 227 lb (103 kg)  04/16/24 227 lb 12.8 oz (103.3 kg)  02/23/24 221 lb (100.2 kg)    Physical Exam GENERAL: Alert, cooperative, well developed, no acute distress. HEENT: Normocephalic, throat red and erythematous with exudate, tympanic membranes clear, moist mucous membranes. NECK: Positive lymphadenopathy. CHEST: Clear to auscultation bilaterally, no wheezes, rhonchi, or crackles. CARDIOVASCULAR: Normal heart rate and rhythm, S1 and S2 normal without murmurs.  ABDOMEN: Soft, non-tender, non-distended, without organomegaly, normal bowel sounds. EXTREMITIES: No cyanosis or edema. NEUROLOGICAL: Cranial nerves grossly intact, moves all extremities without gross motor or sensory deficit.  Results for orders placed or performed in visit on 04/22/24  POCT rapid strep A   Collection Time: 04/22/24 11:41 AM  Result Value Ref Range   Rapid Strep A Screen Positive (A) Negative          Assessment & Plan:   Problem List Items Addressed This Visit   None Visit Diagnoses       Sore throat    -  Primary   Relevant Medications   azithromycin  (ZITHROMAX ) 500 MG tablet   Other Relevant Orders   POCT rapid strep A (Completed)     Strep pharyngitis       Relevant Medications    azithromycin  (ZITHROMAX ) 500 MG tablet     Acute cough       Relevant Medications   benzonatate  (TESSALON ) 100 MG capsule   promethazine -dextromethorphan (PROMETHAZINE -DM) 6.25-15 MG/5ML syrup        Assessment and Plan Assessment & Plan Streptococcal pharyngitis Acute streptococcal pharyngitis with sore throat, cough, fever, chills, and chest tightness for six days. Throat examination reveals erythema with exudate and positive lymphadenopathy. Lungs are clear. Allergic to penicillin. - Prescribed azithromycin  500 mg daily for 5 days.  - Prescribed Tessalon  Perles for cough management, noting it does not suppress cough but alleviates the tickle that triggers coughing. - Prescribed Phenergan  DM for nighttime cough relief to aid sleep. - Advised gargling with salt water and drinking tea with honey for throat soothing. - Encouraged adequate hydration to prevent dehydration.  Can gargle with salt water, drink hot tea with honey, use throat lozenges.        Follow up plan: Return if symptoms worsen or fail to improve. "

## 2024-07-17 ENCOUNTER — Ambulatory Visit: Admitting: Family Medicine
